# Patient Record
Sex: Female | Born: 1940 | Race: White | Hispanic: No | Smoking: Never smoker
Health system: Southern US, Community
[De-identification: ages and names within clinical notes are randomized; demographics above are authoritative.]

## PROBLEM LIST (undated history)

## (undated) DIAGNOSIS — M1990 Unspecified osteoarthritis, unspecified site: Secondary | ICD-10-CM

## (undated) DIAGNOSIS — F0280 Dementia in other diseases classified elsewhere without behavioral disturbance: Secondary | ICD-10-CM

## (undated) DIAGNOSIS — Z9221 Personal history of antineoplastic chemotherapy: Secondary | ICD-10-CM

## (undated) DIAGNOSIS — A938 Other specified arthropod-borne viral fevers: Secondary | ICD-10-CM

## (undated) DIAGNOSIS — Z973 Presence of spectacles and contact lenses: Secondary | ICD-10-CM

## (undated) DIAGNOSIS — E079 Disorder of thyroid, unspecified: Secondary | ICD-10-CM

## (undated) DIAGNOSIS — R000 Tachycardia, unspecified: Secondary | ICD-10-CM

## (undated) DIAGNOSIS — G0490 Encephalitis and encephalomyelitis, unspecified: Secondary | ICD-10-CM

## (undated) DIAGNOSIS — E039 Hypothyroidism, unspecified: Secondary | ICD-10-CM

## (undated) DIAGNOSIS — Z87442 Personal history of urinary calculi: Secondary | ICD-10-CM

## (undated) DIAGNOSIS — IMO0002 Reserved for concepts with insufficient information to code with codable children: Secondary | ICD-10-CM

## (undated) DIAGNOSIS — I10 Essential (primary) hypertension: Secondary | ICD-10-CM

## (undated) DIAGNOSIS — C50919 Malignant neoplasm of unspecified site of unspecified female breast: Secondary | ICD-10-CM

## (undated) DIAGNOSIS — Z923 Personal history of irradiation: Secondary | ICD-10-CM

## (undated) HISTORY — PX: HERNIA REPAIR: SHX51

## (undated) HISTORY — DX: Essential (primary) hypertension: I10

## (undated) HISTORY — PX: THYROIDECTOMY: SHX17

## (undated) HISTORY — PX: OTHER SURGICAL HISTORY: SHX169

## (undated) HISTORY — PX: ABDOMINAL HYSTERECTOMY: SHX81

---

## 1999-09-22 DIAGNOSIS — Z923 Personal history of irradiation: Secondary | ICD-10-CM

## 1999-09-22 DIAGNOSIS — IMO0001 Reserved for inherently not codable concepts without codable children: Secondary | ICD-10-CM

## 1999-09-22 DIAGNOSIS — Z9221 Personal history of antineoplastic chemotherapy: Secondary | ICD-10-CM

## 1999-09-22 DIAGNOSIS — C50919 Malignant neoplasm of unspecified site of unspecified female breast: Secondary | ICD-10-CM

## 1999-09-22 HISTORY — DX: Personal history of irradiation: Z92.3

## 1999-09-22 HISTORY — DX: Malignant neoplasm of unspecified site of unspecified female breast: C50.919

## 1999-09-22 HISTORY — DX: Personal history of antineoplastic chemotherapy: Z92.21

## 1999-09-22 HISTORY — PX: MASTECTOMY: SHX3

## 1999-09-22 HISTORY — DX: Reserved for inherently not codable concepts without codable children: IMO0001

## 2000-04-21 DIAGNOSIS — C50912 Malignant neoplasm of unspecified site of left female breast: Secondary | ICD-10-CM | POA: Insufficient documentation

## 2000-09-21 HISTORY — PX: BREAST EXCISIONAL BIOPSY: SUR124

## 2004-10-22 ENCOUNTER — Ambulatory Visit: Payer: Self-pay | Admitting: Oncology

## 2004-11-19 ENCOUNTER — Ambulatory Visit: Payer: Self-pay | Admitting: Oncology

## 2005-01-19 ENCOUNTER — Ambulatory Visit: Payer: Self-pay

## 2005-02-03 ENCOUNTER — Ambulatory Visit: Payer: Self-pay | Admitting: Orthopaedic Surgery

## 2005-04-16 ENCOUNTER — Ambulatory Visit: Payer: Self-pay | Admitting: Oncology

## 2005-04-21 ENCOUNTER — Ambulatory Visit: Payer: Self-pay | Admitting: Oncology

## 2005-05-28 ENCOUNTER — Ambulatory Visit: Payer: Self-pay | Admitting: Oncology

## 2005-10-01 ENCOUNTER — Ambulatory Visit: Payer: Self-pay | Admitting: Rheumatology

## 2005-11-03 ENCOUNTER — Ambulatory Visit: Payer: Self-pay | Admitting: Oncology

## 2005-11-19 ENCOUNTER — Ambulatory Visit: Payer: Self-pay | Admitting: Oncology

## 2006-03-12 ENCOUNTER — Ambulatory Visit: Payer: Self-pay | Admitting: Oncology

## 2006-03-21 ENCOUNTER — Ambulatory Visit: Payer: Self-pay | Admitting: Oncology

## 2006-04-21 ENCOUNTER — Ambulatory Visit: Payer: Self-pay | Admitting: Oncology

## 2006-06-02 ENCOUNTER — Ambulatory Visit: Payer: Self-pay | Admitting: Oncology

## 2006-09-21 DIAGNOSIS — G0490 Encephalitis and encephalomyelitis, unspecified: Secondary | ICD-10-CM

## 2006-09-21 HISTORY — DX: Encephalitis and encephalomyelitis, unspecified: G04.90

## 2006-10-01 ENCOUNTER — Ambulatory Visit: Payer: Self-pay | Admitting: Unknown Physician Specialty

## 2007-03-22 ENCOUNTER — Ambulatory Visit: Payer: Self-pay | Admitting: Oncology

## 2007-04-07 ENCOUNTER — Ambulatory Visit: Payer: Self-pay | Admitting: Oncology

## 2007-04-22 ENCOUNTER — Ambulatory Visit: Payer: Self-pay | Admitting: Oncology

## 2007-05-23 ENCOUNTER — Ambulatory Visit: Payer: Self-pay | Admitting: Oncology

## 2007-06-22 ENCOUNTER — Ambulatory Visit: Payer: Self-pay | Admitting: Oncology

## 2007-09-22 ENCOUNTER — Ambulatory Visit: Payer: Self-pay | Admitting: Oncology

## 2007-10-03 ENCOUNTER — Ambulatory Visit: Payer: Self-pay | Admitting: Oncology

## 2007-10-23 ENCOUNTER — Ambulatory Visit: Payer: Self-pay | Admitting: Oncology

## 2008-04-09 ENCOUNTER — Ambulatory Visit: Payer: Self-pay | Admitting: Oncology

## 2008-04-21 ENCOUNTER — Ambulatory Visit: Payer: Self-pay | Admitting: Oncology

## 2008-06-12 ENCOUNTER — Ambulatory Visit: Payer: Self-pay | Admitting: Oncology

## 2008-11-14 ENCOUNTER — Ambulatory Visit: Payer: Self-pay | Admitting: Family Medicine

## 2008-12-20 ENCOUNTER — Ambulatory Visit: Payer: Self-pay | Admitting: Oncology

## 2008-12-28 ENCOUNTER — Ambulatory Visit: Payer: Self-pay | Admitting: Oncology

## 2009-01-19 ENCOUNTER — Ambulatory Visit: Payer: Self-pay | Admitting: Oncology

## 2009-10-09 ENCOUNTER — Ambulatory Visit: Payer: Self-pay | Admitting: Unknown Physician Specialty

## 2009-12-11 ENCOUNTER — Ambulatory Visit: Payer: Self-pay | Admitting: Oncology

## 2009-12-20 ENCOUNTER — Ambulatory Visit: Payer: Self-pay | Admitting: Oncology

## 2010-01-23 ENCOUNTER — Ambulatory Visit: Payer: Self-pay | Admitting: Oncology

## 2010-04-01 ENCOUNTER — Ambulatory Visit: Payer: Self-pay | Admitting: Internal Medicine

## 2010-06-05 ENCOUNTER — Ambulatory Visit: Payer: Self-pay | Admitting: Otolaryngology

## 2010-06-10 ENCOUNTER — Ambulatory Visit: Payer: Self-pay | Admitting: Otolaryngology

## 2010-07-09 ENCOUNTER — Ambulatory Visit: Payer: Self-pay | Admitting: Otolaryngology

## 2010-07-21 ENCOUNTER — Ambulatory Visit: Payer: Self-pay | Admitting: Otolaryngology

## 2010-12-12 ENCOUNTER — Ambulatory Visit: Payer: Self-pay | Admitting: Oncology

## 2010-12-19 ENCOUNTER — Ambulatory Visit: Payer: Self-pay | Admitting: Family Medicine

## 2010-12-21 ENCOUNTER — Ambulatory Visit: Payer: Self-pay | Admitting: Oncology

## 2011-01-29 ENCOUNTER — Ambulatory Visit: Payer: Self-pay | Admitting: Oncology

## 2011-08-11 ENCOUNTER — Ambulatory Visit: Payer: Self-pay | Admitting: Family Medicine

## 2011-10-21 ENCOUNTER — Ambulatory Visit: Payer: Self-pay | Admitting: General Practice

## 2011-10-26 DIAGNOSIS — M75120 Complete rotator cuff tear or rupture of unspecified shoulder, not specified as traumatic: Secondary | ICD-10-CM | POA: Insufficient documentation

## 2011-12-14 ENCOUNTER — Ambulatory Visit: Payer: Self-pay | Admitting: Oncology

## 2011-12-14 LAB — CBC CANCER CENTER
Basophil #: 0 x10 3/mm (ref 0.0–0.1)
Eosinophil #: 0.1 x10 3/mm (ref 0.0–0.7)
Eosinophil %: 1.9 %
Lymphocyte #: 1.5 x10 3/mm (ref 1.0–3.6)
Lymphocyte %: 28.5 %
MCH: 30.2 pg (ref 26.0–34.0)
MCHC: 34.8 g/dL (ref 32.0–36.0)
MCV: 87 fL (ref 80–100)
Monocyte #: 0.4 x10 3/mm (ref 0.0–0.7)
Monocyte %: 6.9 %
Neutrophil #: 3.3 x10 3/mm (ref 1.4–6.5)
Neutrophil %: 62 %
Platelet: 192 x10 3/mm (ref 150–440)
RBC: 4.78 10*6/uL (ref 3.80–5.20)
RDW: 14.6 % — ABNORMAL HIGH (ref 11.5–14.5)
WBC: 5.4 x10 3/mm (ref 3.6–11.0)

## 2011-12-14 LAB — COMPREHENSIVE METABOLIC PANEL
Albumin: 3.7 g/dL (ref 3.4–5.0)
Alkaline Phosphatase: 78 U/L (ref 50–136)
Anion Gap: 9 (ref 7–16)
Bilirubin,Total: 0.4 mg/dL (ref 0.2–1.0)
Calcium, Total: 9.2 mg/dL (ref 8.5–10.1)
Chloride: 104 mmol/L (ref 98–107)
Co2: 30 mmol/L (ref 21–32)
Creatinine: 0.94 mg/dL (ref 0.60–1.30)
EGFR (African American): >60
EGFR (Non-African Amer.): >60
Glucose: 110 mg/dL — ABNORMAL HIGH (ref 65–99)
Potassium: 4 mmol/L (ref 3.5–5.1)
SGOT(AST): 16 U/L (ref 15–37)
Total Protein: 7 g/dL (ref 6.4–8.2)

## 2011-12-21 ENCOUNTER — Ambulatory Visit: Payer: Self-pay | Admitting: Oncology

## 2012-01-20 ENCOUNTER — Ambulatory Visit: Payer: Self-pay | Admitting: Oncology

## 2012-02-22 ENCOUNTER — Ambulatory Visit: Payer: Self-pay | Admitting: Oncology

## 2012-06-27 ENCOUNTER — Ambulatory Visit: Payer: Self-pay | Admitting: Oncology

## 2012-06-27 LAB — CBC CANCER CENTER
Basophil #: 0.1 x10 3/mm (ref 0.0–0.1)
Eosinophil #: 0.2 x10 3/mm (ref 0.0–0.7)
HCT: 43.3 % (ref 35.0–47.0)
Lymphocyte #: 1.4 x10 3/mm (ref 1.0–3.6)
Lymphocyte %: 24.2 %
MCHC: 32.9 g/dL (ref 32.0–36.0)
Monocyte #: 0.4 x10 3/mm (ref 0.2–0.9)
Neutrophil #: 3.8 x10 3/mm (ref 1.4–6.5)
Neutrophil %: 64.8 %
RDW: 14.1 % (ref 11.5–14.5)

## 2012-06-27 LAB — COMPREHENSIVE METABOLIC PANEL
BUN: 9 mg/dL (ref 7–18)
Bilirubin,Total: 0.7 mg/dL (ref 0.2–1.0)
Chloride: 105 mmol/L (ref 98–107)
Creatinine: 0.9 mg/dL (ref 0.60–1.30)
EGFR (African American): >60
Osmolality: 280 (ref 275–301)
Potassium: 4 mmol/L (ref 3.5–5.1)
SGPT (ALT): 19 U/L (ref 12–78)
Sodium: 141 mmol/L (ref 136–145)
Total Protein: 7.4 g/dL (ref 6.4–8.2)

## 2012-07-22 ENCOUNTER — Ambulatory Visit: Payer: Self-pay | Admitting: Oncology

## 2012-07-27 DIAGNOSIS — R413 Other amnesia: Secondary | ICD-10-CM | POA: Insufficient documentation

## 2012-08-05 ENCOUNTER — Emergency Department: Payer: Self-pay | Admitting: Emergency Medicine

## 2012-10-29 ENCOUNTER — Emergency Department: Payer: Self-pay | Admitting: Emergency Medicine

## 2012-10-30 LAB — COMPREHENSIVE METABOLIC PANEL
Alkaline Phosphatase: 71 U/L (ref 50–136)
Anion Gap: 9 (ref 7–16)
BUN: 11 mg/dL (ref 7–18)
Calcium, Total: 9.5 mg/dL (ref 8.5–10.1)
Chloride: 103 mmol/L (ref 98–107)
EGFR (Non-African Amer.): >60
Glucose: 104 mg/dL — ABNORMAL HIGH (ref 65–99)
Potassium: 3.8 mmol/L (ref 3.5–5.1)
SGOT(AST): 22 U/L (ref 15–37)
Total Protein: 7.6 g/dL (ref 6.4–8.2)

## 2012-10-30 LAB — CBC WITH DIFFERENTIAL/PLATELET
Basophil #: 0.1 10*3/uL (ref 0.0–0.1)
Basophil %: 0.9 %
Eosinophil %: 1.8 %
HGB: 15.4 g/dL (ref 12.0–16.0)
Lymphocyte #: 1.9 10*3/uL (ref 1.0–3.6)
Lymphocyte %: 25.6 %
MCH: 29.5 pg (ref 26.0–34.0)
MCHC: 34.5 g/dL (ref 32.0–36.0)
MCV: 85 fL (ref 80–100)
Monocyte %: 8 %
Neutrophil #: 4.7 10*3/uL (ref 1.4–6.5)
RBC: 5.23 10*6/uL — ABNORMAL HIGH (ref 3.80–5.20)
RDW: 14 % (ref 11.5–14.5)
WBC: 7.3 10*3/uL (ref 3.6–11.0)

## 2012-10-30 LAB — TROPONIN I: Troponin-I: <0.02 ng/mL

## 2012-10-30 LAB — URINALYSIS, COMPLETE
Glucose,UR: NEGATIVE mg/dL (ref 0–75)
Nitrite: NEGATIVE
Ph: 8 (ref 4.5–8.0)
Protein: NEGATIVE
Specific Gravity: 1.005 (ref 1.003–1.030)
Squamous Epithelial: <1
Transitional Epi: <1

## 2012-10-30 LAB — CK TOTAL AND CKMB (NOT AT ARMC)
CK, Total: 67 U/L (ref 21–215)
CK-MB: 1 ng/mL (ref 0.5–3.6)

## 2012-12-23 ENCOUNTER — Ambulatory Visit: Payer: Self-pay | Admitting: Oncology

## 2013-05-11 ENCOUNTER — Ambulatory Visit: Payer: Self-pay | Admitting: Oncology

## 2013-08-22 ENCOUNTER — Ambulatory Visit: Payer: Self-pay | Admitting: Unknown Physician Specialty

## 2013-08-25 ENCOUNTER — Ambulatory Visit: Payer: Self-pay | Admitting: Unknown Physician Specialty

## 2013-08-31 ENCOUNTER — Ambulatory Visit: Payer: Self-pay | Admitting: Gynecologic Oncology

## 2013-09-05 ENCOUNTER — Ambulatory Visit: Payer: Self-pay | Admitting: Gynecologic Oncology

## 2013-09-06 LAB — CANCER ANTIGEN 27.29: CA 27.29: 12.6 U/mL (ref 0.0–38.6)

## 2013-09-06 LAB — CEA: CEA: 1.4 ng/mL (ref 0.0–4.7)

## 2013-09-21 ENCOUNTER — Ambulatory Visit: Payer: Self-pay | Admitting: Gynecologic Oncology

## 2013-09-25 ENCOUNTER — Ambulatory Visit: Payer: Self-pay | Admitting: Unknown Physician Specialty

## 2013-09-26 LAB — PATHOLOGY REPORT

## 2013-10-22 ENCOUNTER — Ambulatory Visit: Payer: Self-pay | Admitting: Gynecologic Oncology

## 2013-11-15 DIAGNOSIS — R1900 Intra-abdominal and pelvic swelling, mass and lump, unspecified site: Secondary | ICD-10-CM | POA: Insufficient documentation

## 2013-11-20 ENCOUNTER — Ambulatory Visit: Payer: Self-pay | Admitting: Family Medicine

## 2013-11-24 DIAGNOSIS — R002 Palpitations: Secondary | ICD-10-CM | POA: Insufficient documentation

## 2013-11-24 DIAGNOSIS — E039 Hypothyroidism, unspecified: Secondary | ICD-10-CM | POA: Insufficient documentation

## 2014-03-08 ENCOUNTER — Emergency Department: Payer: Self-pay | Admitting: Emergency Medicine

## 2014-04-06 DIAGNOSIS — M1710 Unilateral primary osteoarthritis, unspecified knee: Secondary | ICD-10-CM

## 2014-04-06 DIAGNOSIS — M1712 Unilateral primary osteoarthritis, left knee: Secondary | ICD-10-CM | POA: Insufficient documentation

## 2014-05-21 ENCOUNTER — Ambulatory Visit: Payer: Self-pay | Admitting: Podiatry

## 2014-07-12 ENCOUNTER — Ambulatory Visit: Payer: Self-pay | Admitting: Oncology

## 2014-08-08 ENCOUNTER — Ambulatory Visit: Payer: Self-pay | Admitting: Oncology

## 2014-08-21 ENCOUNTER — Ambulatory Visit: Payer: Self-pay | Admitting: Oncology

## 2014-08-21 DIAGNOSIS — I10 Essential (primary) hypertension: Secondary | ICD-10-CM

## 2014-08-21 HISTORY — DX: Essential (primary) hypertension: I10

## 2014-09-03 ENCOUNTER — Other Ambulatory Visit: Payer: Self-pay | Admitting: Oncology

## 2014-09-03 DIAGNOSIS — R922 Inconclusive mammogram: Secondary | ICD-10-CM

## 2014-09-03 DIAGNOSIS — Z853 Personal history of malignant neoplasm of breast: Secondary | ICD-10-CM

## 2014-09-03 DIAGNOSIS — N644 Mastodynia: Secondary | ICD-10-CM

## 2014-09-19 ENCOUNTER — Ambulatory Visit
Admission: RE | Admit: 2014-09-19 | Discharge: 2014-09-19 | Disposition: A | Discharge status: Home / Self Care | Payer: Medicare Other | Source: Ambulatory Visit | Attending: Oncology | Admitting: Oncology

## 2014-09-19 DIAGNOSIS — R922 Inconclusive mammogram: Secondary | ICD-10-CM

## 2014-09-19 DIAGNOSIS — Z853 Personal history of malignant neoplasm of breast: Secondary | ICD-10-CM

## 2014-09-19 DIAGNOSIS — N644 Mastodynia: Secondary | ICD-10-CM

## 2014-09-19 MED ORDER — GADOBENATE DIMEGLUMINE 529 MG/ML IV SOLN: 15.0000 mL | Freq: Once | INTRAVENOUS | Status: AC | PRN

## 2014-10-23 DIAGNOSIS — R413 Other amnesia: Secondary | ICD-10-CM | POA: Insufficient documentation

## 2014-10-23 DIAGNOSIS — F09 Unspecified mental disorder due to known physiological condition: Secondary | ICD-10-CM | POA: Insufficient documentation

## 2014-10-23 DIAGNOSIS — F419 Anxiety disorder, unspecified: Secondary | ICD-10-CM | POA: Insufficient documentation

## 2015-01-20 DIAGNOSIS — Z86711 Personal history of pulmonary embolism: Secondary | ICD-10-CM | POA: Insufficient documentation

## 2015-02-28 DIAGNOSIS — R079 Chest pain, unspecified: Secondary | ICD-10-CM | POA: Insufficient documentation

## 2015-03-04 ENCOUNTER — Emergency Department: Payer: PPO

## 2015-03-04 ENCOUNTER — Inpatient Hospital Stay
Admission: EM | Admit: 2015-03-04 | Discharge: 2015-03-07 | DRG: 871 | Disposition: A | Discharge status: Home / Self Care | Payer: PPO | Attending: Internal Medicine | Admitting: Internal Medicine

## 2015-03-04 ENCOUNTER — Encounter: Payer: Self-pay | Admitting: Emergency Medicine

## 2015-03-04 DIAGNOSIS — Z79899 Other long term (current) drug therapy: Secondary | ICD-10-CM | POA: Diagnosis not present

## 2015-03-04 DIAGNOSIS — R509 Fever, unspecified: Secondary | ICD-10-CM | POA: Insufficient documentation

## 2015-03-04 DIAGNOSIS — A419 Sepsis, unspecified organism: Principal | ICD-10-CM | POA: Diagnosis present

## 2015-03-04 DIAGNOSIS — Z888 Allergy status to other drugs, medicaments and biological substances status: Secondary | ICD-10-CM

## 2015-03-04 DIAGNOSIS — M62838 Other muscle spasm: Secondary | ICD-10-CM | POA: Diagnosis present

## 2015-03-04 DIAGNOSIS — R109 Unspecified abdominal pain: Secondary | ICD-10-CM

## 2015-03-04 DIAGNOSIS — I10 Essential (primary) hypertension: Secondary | ICD-10-CM | POA: Diagnosis present

## 2015-03-04 DIAGNOSIS — T675XXA Heat exhaustion, unspecified, initial encounter: Secondary | ICD-10-CM | POA: Diagnosis present

## 2015-03-04 DIAGNOSIS — Z90710 Acquired absence of both cervix and uterus: Secondary | ICD-10-CM | POA: Diagnosis not present

## 2015-03-04 DIAGNOSIS — R410 Disorientation, unspecified: Secondary | ICD-10-CM

## 2015-03-04 DIAGNOSIS — Z853 Personal history of malignant neoplasm of breast: Secondary | ICD-10-CM | POA: Diagnosis not present

## 2015-03-04 DIAGNOSIS — K5900 Constipation, unspecified: Secondary | ICD-10-CM | POA: Diagnosis present

## 2015-03-04 DIAGNOSIS — I2699 Other pulmonary embolism without acute cor pulmonale: Secondary | ICD-10-CM | POA: Diagnosis present

## 2015-03-04 DIAGNOSIS — Z885 Allergy status to narcotic agent status: Secondary | ICD-10-CM | POA: Diagnosis not present

## 2015-03-04 DIAGNOSIS — Z91040 Latex allergy status: Secondary | ICD-10-CM | POA: Diagnosis not present

## 2015-03-04 DIAGNOSIS — R600 Localized edema: Secondary | ICD-10-CM | POA: Diagnosis present

## 2015-03-04 DIAGNOSIS — R079 Chest pain, unspecified: Secondary | ICD-10-CM

## 2015-03-04 DIAGNOSIS — E860 Dehydration: Secondary | ICD-10-CM | POA: Diagnosis present

## 2015-03-04 DIAGNOSIS — Z9012 Acquired absence of left breast and nipple: Secondary | ICD-10-CM | POA: Diagnosis present

## 2015-03-04 DIAGNOSIS — I959 Hypotension, unspecified: Secondary | ICD-10-CM

## 2015-03-04 DIAGNOSIS — G43909 Migraine, unspecified, not intractable, without status migrainosus: Secondary | ICD-10-CM | POA: Diagnosis present

## 2015-03-04 DIAGNOSIS — R609 Edema, unspecified: Secondary | ICD-10-CM

## 2015-03-04 DIAGNOSIS — E039 Hypothyroidism, unspecified: Secondary | ICD-10-CM | POA: Diagnosis present

## 2015-03-04 HISTORY — DX: Other specified arthropod-borne viral fevers: A93.8

## 2015-03-04 HISTORY — DX: Disorder of thyroid, unspecified: E07.9

## 2015-03-04 HISTORY — DX: Tachycardia, unspecified: R00.0

## 2015-03-04 HISTORY — DX: Malignant neoplasm of unspecified site of unspecified female breast: C50.919

## 2015-03-04 HISTORY — DX: Encephalitis and encephalomyelitis, unspecified: G04.90

## 2015-03-04 LAB — COMPREHENSIVE METABOLIC PANEL
ALT: 14 U/L (ref 14–54)
AST: 20 U/L (ref 15–41)
Albumin: 3.8 g/dL (ref 3.5–5.0)
Alkaline Phosphatase: 65 U/L (ref 38–126)
Anion gap: 8 (ref 5–15)
BUN: 10 mg/dL (ref 6–20)
CALCIUM: 8.6 mg/dL — AB (ref 8.9–10.3)
CO2: 26 mmol/L (ref 22–32)
CREATININE: 0.65 mg/dL (ref 0.44–1.00)
Chloride: 100 mmol/L — ABNORMAL LOW (ref 101–111)
GFR calc Af Amer: >60 mL/min (ref >60)
Glucose, Bld: 165 mg/dL — ABNORMAL HIGH (ref 65–99)
Potassium: 3.4 mmol/L — ABNORMAL LOW (ref 3.5–5.1)
Sodium: 134 mmol/L — ABNORMAL LOW (ref 135–145)
TOTAL PROTEIN: 6.7 g/dL (ref 6.5–8.1)
Total Bilirubin: 1.1 mg/dL (ref 0.3–1.2)

## 2015-03-04 LAB — CKMB (ARMC ONLY): CK, MB: 1.3 ng/mL (ref 0.5–5.0)

## 2015-03-04 LAB — CBC WITH DIFFERENTIAL/PLATELET
BASOS PCT: 1 %
Basophils Absolute: 0.1 10*3/uL (ref 0–0.1)
EOS ABS: 0.1 10*3/uL (ref 0–0.7)
Eosinophils Relative: 1 %
HCT: 37.6 % (ref 35.0–47.0)
HEMOGLOBIN: 12.8 g/dL (ref 12.0–16.0)
Lymphocytes Relative: 9 %
Lymphs Abs: 1.1 10*3/uL (ref 1.0–3.6)
MCH: 28.9 pg (ref 26.0–34.0)
MCHC: 34 g/dL (ref 32.0–36.0)
MCV: 85.2 fL (ref 80.0–100.0)
MONO ABS: 1.1 10*3/uL — AB (ref 0.2–0.9)
MONOS PCT: 9 %
Neutro Abs: 9.9 10*3/uL — ABNORMAL HIGH (ref 1.4–6.5)
Neutrophils Relative %: 80 %
Platelets: 164 10*3/uL (ref 150–440)
RBC: 4.41 MIL/uL (ref 3.80–5.20)
RDW: 13.9 % (ref 11.5–14.5)
WBC: 12.3 10*3/uL — ABNORMAL HIGH (ref 3.6–11.0)

## 2015-03-04 LAB — URINALYSIS COMPLETE WITH MICROSCOPIC (ARMC ONLY)
BACTERIA UA: NONE SEEN
Bilirubin Urine: NEGATIVE
GLUCOSE, UA: NEGATIVE mg/dL
KETONES UR: NEGATIVE mg/dL
Leukocytes, UA: NEGATIVE
Nitrite: NEGATIVE
PROTEIN: NEGATIVE mg/dL
Specific Gravity, Urine: 1.011 (ref 1.005–1.030)
pH: 6 (ref 5.0–8.0)

## 2015-03-04 LAB — CK: CK TOTAL: 43 U/L (ref 38–234)

## 2015-03-04 LAB — LACTIC ACID, PLASMA: LACTIC ACID, VENOUS: 0.9 mmol/L (ref 0.5–2.0)

## 2015-03-04 MED ORDER — SODIUM CHLORIDE 0.9 % IV BOLUS (SEPSIS)
1000.0000 mL | Freq: Once | INTRAVENOUS | Status: AC
  Administered 2015-03-04: 1000 mL via INTRAVENOUS

## 2015-03-04 MED ORDER — SENNOSIDES-DOCUSATE SODIUM 8.6-50 MG PO TABS
1.0000 | ORAL_TABLET | Freq: Every evening | ORAL | Status: DC | PRN
  Administered 2015-03-05: 18:00:00 1 via ORAL
  Filled 2015-03-04: qty 1

## 2015-03-04 MED ORDER — HEPARIN SODIUM (PORCINE) 5000 UNIT/ML IJ SOLN
5000.0000 [IU] | Freq: Three times a day (TID) | INTRAMUSCULAR | Status: DC
  Administered 2015-03-04 – 2015-03-05 (×3): 5000 [IU] via SUBCUTANEOUS
  Filled 2015-03-04 (×3): qty 1

## 2015-03-04 MED ORDER — ACETAMINOPHEN 325 MG PO TABS
650.0000 mg | ORAL_TABLET | Freq: Four times a day (QID) | ORAL | Status: DC | PRN
  Administered 2015-03-04 – 2015-03-06 (×6): 650 mg via ORAL
  Filled 2015-03-04 (×6): qty 2

## 2015-03-04 MED ORDER — ONDANSETRON HCL 4 MG PO TABS
4.0000 mg | ORAL_TABLET | Freq: Four times a day (QID) | ORAL | Status: DC | PRN
  Administered 2015-03-06: 08:00:00 4 mg via ORAL
  Filled 2015-03-04: qty 1

## 2015-03-04 MED ORDER — LEVOTHYROXINE SODIUM 50 MCG PO TABS
50.0000 ug | ORAL_TABLET | ORAL | Status: DC
  Administered 2015-03-04 – 2015-03-07 (×4): 50 ug via ORAL
  Filled 2015-03-04 (×4): qty 1

## 2015-03-04 MED ORDER — ACETAMINOPHEN 325 MG PO TABS
650.0000 mg | ORAL_TABLET | Freq: Once | ORAL | Status: AC
  Administered 2015-03-04: 650 mg via ORAL

## 2015-03-04 MED ORDER — IBUPROFEN 400 MG PO TABS
800.0000 mg | ORAL_TABLET | Freq: Once | ORAL | Status: DC
  Filled 2015-03-04: qty 2

## 2015-03-04 MED ORDER — ACETAMINOPHEN 650 MG RE SUPP: 650.0000 mg | Freq: Four times a day (QID) | RECTAL | Status: DC | PRN

## 2015-03-04 MED ORDER — ONDANSETRON HCL 4 MG/2ML IJ SOLN
4.0000 mg | Freq: Four times a day (QID) | INTRAMUSCULAR | Status: DC | PRN
  Administered 2015-03-04 – 2015-03-06 (×2): 4 mg via INTRAVENOUS
  Filled 2015-03-04 (×2): qty 2

## 2015-03-04 MED ORDER — ACETAMINOPHEN 325 MG PO TABS
ORAL_TABLET | ORAL | Status: AC
  Filled 2015-03-04: qty 2

## 2015-03-04 MED ORDER — SODIUM CHLORIDE 0.9 % IV SOLN
INTRAVENOUS | Status: DC
  Administered 2015-03-04 – 2015-03-07 (×6): via INTRAVENOUS

## 2015-03-04 MED ORDER — POTASSIUM CHLORIDE 20 MEQ/15ML (10%) PO SOLN
40.0000 meq | Freq: Once | ORAL | Status: AC
  Administered 2015-03-04: 15:00:00 40 meq via ORAL
  Filled 2015-03-04: qty 30

## 2015-03-04 MED ORDER — PIPERACILLIN-TAZOBACTAM 3.375 G IVPB 30 MIN
3.3750 g | Freq: Four times a day (QID) | INTRAVENOUS | Status: DC
  Administered 2015-03-04: 3.375 g via INTRAVENOUS
  Filled 2015-03-04 (×5): qty 50

## 2015-03-04 MED ORDER — LORAZEPAM 1 MG PO TABS
1.0000 mg | ORAL_TABLET | ORAL | Status: AC
  Administered 2015-03-04: 14:00:00 1 mg via ORAL
  Filled 2015-03-04: qty 1

## 2015-03-04 MED ORDER — PIPERACILLIN-TAZOBACTAM 3.375 G IVPB
3.3750 g | Freq: Three times a day (TID) | INTRAVENOUS | Status: DC
  Administered 2015-03-04 – 2015-03-07 (×8): 3.375 g via INTRAVENOUS
  Filled 2015-03-04 (×12): qty 50

## 2015-03-04 MED ORDER — ALUM & MAG HYDROXIDE-SIMETH 200-200-20 MG/5ML PO SUSP: 30.0000 mL | Freq: Four times a day (QID) | ORAL | Status: DC | PRN

## 2015-03-04 MED ORDER — METOPROLOL SUCCINATE ER 25 MG PO TB24
25.0000 mg | ORAL_TABLET | Freq: Every day | ORAL | Status: DC
  Administered 2015-03-04: 14:00:00 25 mg via ORAL
  Filled 2015-03-04 (×2): qty 1

## 2015-03-04 NOTE — ED Notes (Signed)
Pt with blood pressure 84/53. Pt with 3+ radial pulses, skin warm, slightly moist, pink. Hr 72. Dr. Dahlia Client notified of pt's blood pressure, order for ns bolus received.

## 2015-03-04 NOTE — ED Provider Notes (Signed)
[facility] Emergency Department Provider Note  ____________________________________________  Time seen: Approximately 4:11 AM  I have reviewed the triage vital signs and the nursing notes.   HISTORY  Chief Complaint Heat Exposure    HPI Debra Patel is a 74 y.o. female who comes in today with a fever. The patient reports to her daughter kept checking her temperature and it was going higher and higher. The patient's daughter gave her dose of Aleve but it did not help bring down her temperature. The patient reports that she does not like to take medicines and most medicines do not help her. She also reports that she felt as though she had to take deeper breaths than normal earlier today. She reports that she did have some nausea earlier as well as some back pain which is improved. The patient seems very somnolent and mildly confused during the exam. Per the triage note the patient was out in the heat mowing grass today and then started feeling bad. They report that her temperature was up to 103.2. She was brought in for evaluation of heat exhaustion.   Past Medical History  Diagnosis Date  . Thyroid disease   . Tick fever   . Brain infection   . Tachycardia     There are no active problems to display for this patient.   Past Surgical History  Procedure Laterality Date  . Abdominal hysterectomy    . Mastectomy      Current Outpatient Rx  Name  Route  Sig  Dispense  Refill  . levothyroxine (SYNTHROID, LEVOTHROID) 50 MCG tablet   Oral   Take 50 mcg by mouth every morning. (brand name only)           Dispense as written.     Allergies Motrin; Oxycodone; and Latex  No family history on file.  Social History History  Substance Use Topics  . Smoking status: Never Smoker   . Smokeless tobacco: Not on file  . Alcohol Use: No    Review of Systems Constitutional: Fever Eyes: No visual changes. ENT: No sore throat. Cardiovascular: Denies  chest pain. Respiratory: Shortness of breath. Gastrointestinal: Nausea with no vomiting or diarrhea or abdominal pain Genitourinary: Negative for dysuria. Musculoskeletal: back pain. Skin: Negative for rash. Neurological: headaches,  10-point ROS otherwise negative.  ____________________________________________   PHYSICAL EXAM:  VITAL SIGNS: ED Triage Vitals  Enc Vitals Group     BP 03/04/15 0031 102/52 mmHg     Pulse Rate 03/04/15 0031 113     Resp 03/04/15 0031 20     Temp 03/04/15 0031 101.4 F (38.6 C)     Temp Source 03/04/15 0031 Oral     SpO2 03/04/15 0031 95 %     Weight 03/04/15 0031 163 lb (73.936 kg)     Height 03/04/15 0031 5\' 4"  (1.626 m)     Head Cir --      Peak Flow --      Pain Score 03/04/15 0040 0     Pain Loc --      Pain Edu? --      Excl. in [place]? --     Constitutional: Alert and oriented. Well appearing and in moderate distress. Eyes: Conjunctivae are normal. PERRL. EOMI. Head: Atraumatic. Nose: No congestion/rhinnorhea. Mouth/Throat: Mucous membranes are moist.  Oropharynx non-erythematous. Cardiovascular: Normal rate, regular rhythm. Grossly normal heart sounds.  Good peripheral circulation. Respiratory: Normal respiratory effort.  No retractions. Lungs CTAB. Gastrointestinal: Soft and nontender. No  distention. Positive bowel sounds Genitourinary: Deferred Musculoskeletal: No lower extremity tenderness nor edema.  No joint effusions. Neurologic:  Normal speech and language. No gross focal neurologic deficits are appreciated.  Skin:  Skin is warm, dry and intact. No rash noted. Psychiatric: Mood and affect are normal  ____________________________________________   LABS (all labs ordered are listed, but only abnormal results are displayed)  Labs Reviewed  COMPREHENSIVE METABOLIC PANEL - Abnormal; Notable for the following:    Sodium 134 (*)    Potassium 3.4 (*)    Chloride 100 (*)    Glucose, Bld 165 (*)    Calcium 8.6 (*)    All other  components within normal limits  CBC WITH DIFFERENTIAL/PLATELET - Abnormal; Notable for the following:    WBC 12.3 (*)    Neutro Abs 9.9 (*)    Monocytes Absolute 1.1 (*)    All other components within normal limits  URINALYSIS COMPLETEWITH MICROSCOPIC ([facility] ONLY) - Abnormal; Notable for the following:    Color, Urine YELLOW (*)    APPearance CLEAR (*)    Hgb urine dipstick 1+ (*)    Squamous Epithelial / LPF 0-5 (*)    All other components within normal limits  CULTURE, BLOOD (ROUTINE X 2)  CULTURE, BLOOD (ROUTINE X 2)  CK  CKMB([facility] ONLY)  LACTIC ACID, PLASMA  LACTIC ACID, PLASMA   ____________________________________________  EKG  ED ECG REPORT I, Loney Hering, the attending physician, personally viewed and interpreted this ECG.   Date: 03/04/2015  EKG Time: 703  Rate: 72  Rhythm: normal sinus rhythm  Axis: normal   Intervals:none  ST&T Change: none  ____________________________________________  RADIOLOGY  CT Head: No acute intracranial abnormalities, Mild, chronic atrophy and small vessel ischemic changes CXR: No active cardiopulmonary disease ____________________________________________   PROCEDURES  Procedure(s) performed: None  Critical Care performed: No  ____________________________________________   INITIAL IMPRESSION / ASSESSMENT AND PLAN / ED COURSE  Pertinent labs & imaging results that were available during my care of the patient were reviewed by me and considered in my medical decision making (see chart for details).  The patient is a 75 year old female who comes in with a fever of unknown origin and hypotension. The patient reports that she feels better but after 1 liter of normal saline her blood pressure decreased to 77 systolic. The patient will be admitted to the hospital with possible heat stroke vs sepsis. The patient has no sign of infection in her urine or chest xray. The patient has no other concerns at this  point. ____________________________________________   FINAL CLINICAL IMPRESSION(S) / ED DIAGNOSES  Final diagnoses:  Fever, unspecified fever cause  Hypotension, unspecified hypotension type  Confusion      Loney Hering, MD 03/04/15 [phone]

## 2015-03-04 NOTE — ED Notes (Signed)
Pt states yesterday she mowed out in the heat for an hour, when she stopped she started to feel bad, disoriented, ran a fever, had chills, pt states this am she cont to feel bad, with vomiting and nausea, chest tightness, pt states an hour ago she was very nauseated. Pt states her temp has been up to 103.2 pt denies cough or congestion, denies urinary s/s, states that she has been tired feeling for the past few days

## 2015-03-04 NOTE — Plan of Care (Signed)
Problem: Discharge Progression Outcomes Goal: Discharge plan in place and appropriate individualization  Outcome: Progressing Address patient as Debra Patel. Patient lives at home alone. Patient has history of thyroid disease, tachycardia, brain infection, tick fever controlled with home medications. Goal: Other Discharge Outcomes/Goals Outcome: Progressing Plan of Care Progressing to Goal: No complaints of pain. VSS. Temperature elevated tylenol given and cooling blanket applied. IV fluids infusing. Tolerated diet well.

## 2015-03-04 NOTE — ED Notes (Signed)
Report to anna, rn

## 2015-03-04 NOTE — Progress Notes (Signed)
Dr. Benjie Karvonen notified that the pt was very aggitated and HR was elevated with fever, orders received for ativan x 1 dose and restart metoprolol Makinzi Prieur I, RN 03/04/2015 2:04 PM

## 2015-03-04 NOTE — ED Notes (Signed)
Patient transported to CT 

## 2015-03-04 NOTE — H&P (Signed)
[place] [facility] [place] NAME: Debra Patel    MR#:  [id number]  DATE OF BIRTH:  03-Feb-1941  DATE OF ADMISSION:  03/04/2015  PRIMARY CARE PHYSICIAN: No PCP Per Patient   REQUESTING/REFERRING PHYSICIAN: Dr. Dahlia Client  CHIEF COMPLAINT:  Fever  HISTORY OF PRESENT ILLNESS:  Debra Patel  is a 74 y.o. female with a known history of hypothyroidosm who presented to the emergency department for fever. Patient's daughter reports that she gave her Aleve for the temperature but it did not improve. Patient was apparently outside mowing her grass earlier in the day. They reported her temperature was up to 103.2. She also reports she was not feeling well for past 4 days. Nothing in particular she says, just not feeling well. She does state at some point she had RLQ abdominal pain, but this has gone away. In the emergency department she was found to have hypotension. She was given 1 L of normal saline. PAST MEDICAL HISTORY:   Past Medical History  Diagnosis Date  . Thyroid disease   . Tick fever   . Brain infection   . Tachycardia    Breast cancer PAST SURGICAL HISTORY:   Past Surgical History  Procedure Laterality Date  . Abdominal hysterectomy    . Mastectomy      SOCIAL HISTORY:   History  Substance Use Topics  . Smoking status: Never Smoker   . Smokeless tobacco: Not on file  . Alcohol Use: No    FAMILY HISTORY:  No history of CAD or hypertension.  DRUG ALLERGIES:   Allergies  Allergen Reactions  . Motrin [Ibuprofen] Other (See Comments)    depression  . Oxycodone Nausea Only  . Latex Rash     REVIEW OF SYSTEMS:  CONSTITUTIONAL: Positive for fever, fatigue and weakness.  EYES: No blurred or double vision.  EARS, NOSE, AND THROAT: No tinnitus or ear pain.  RESPIRATORY: No cough, shortness of breath, wheezing or hemoptysis.  CARDIOVASCULAR: No chest pain, orthopnea, edema.  GASTROINTESTINAL: No nausea, vomiting, diarrhea  or abdominal pain.  GENITOURINARY: No dysuria, hematuria.  ENDOCRINE: No polyuria, nocturia,  HEMATOLOGY: No anemia, easy bruising or bleeding SKIN: No rash or lesion. MUSCULOSKELETAL: No joint pain or arthritis.   NEUROLOGIC: No tingling, numbness, weakness.  PSYCHIATRY: No anxiety or depression.   MEDICATIONS AT HOME:   Prior to Admission medications   Medication Sig Start Date End Date Taking? Authorizing Provider  hydrochlorothiazide (HYDRODIURIL) 25 MG tablet Take 25 mg by mouth daily as needed (swelling).   Yes Historical Provider, MD  levothyroxine (SYNTHROID, LEVOTHROID) 50 MCG tablet Take 50 mcg by mouth every morning. (brand name only)   Yes Historical Provider, MD  metoprolol succinate (TOPROL-XL) 25 MG 24 hr tablet Take 25 mg by mouth daily.   Yes Historical Provider, MD      VITAL SIGNS:  Blood pressure 114/54, pulse 72, temperature 97.7 F (36.5 C), temperature source Oral, resp. rate 20, height 5\' 4"  (1.626 m), weight 76.856 kg (169 lb 7 oz), SpO2 99 %.  PHYSICAL EXAMINATION:  GENERAL:  74 y.o.-year-old patient lying in the bed with no acute distress.  EYES: Pupils equal, round, reactive to light and accommodation. No scleral icterus. Extraocular muscles intact.  HEENT: Head atraumatic, normocephalic. Oropharynx and nasopharynx clear.  NECK:  Supple, no jugular venous distention. No thyroid enlargement, no tenderness.  LUNGS: Normal breath sounds bilaterally, no wheezing, rales,rhonchi or crepitation. No use of accessory muscles of respiration.  CARDIOVASCULAR: S1, S2 normal. No murmurs, rubs, or gallops.  ABDOMEN: Soft, nontender, nondistended. Bowel sounds present. No organomegaly or mass.  EXTREMITIES: No pedal edema, cyanosis, or clubbing.  NEUROLOGIC: Cranial nerves II through XII are intact. Muscle strength 5/5 in all extremities. Sensation intact. Gait not checked.  PSYCHIATRIC: The patient is alert and oriented x 3.  SKIN: No obvious rash, lesion, or ulcer.    LABORATORY PANEL:   CBC  Recent Labs Lab 03/04/15 0049  WBC 12.3*  HGB 12.8  HCT 37.6  PLT 164   ------------------------------------------------------------------------------------------------------------------  Chemistries   Recent Labs Lab 03/04/15 0049  NA 134*  K 3.4*  CL 100*  CO2 26  GLUCOSE 165*  BUN 10  CREATININE 0.65  CALCIUM 8.6*  AST 20  ALT 14  ALKPHOS 65  BILITOT 1.1   ------------------------------------------------------------------------------------------------------------------  Cardiac Enzymes No results for input(s): TROPONINI in the last 168 hours. ------------------------------------------------------------------------------------------------------------------  RADIOLOGY:  Dg Chest 2 View  03/04/2015    active cardiopulmonary disease.   Electronically Signed   By: Lucienne Capers M.D.   On: 03/04/2015 05:21   Ct Head Wo Contrast  03/04/2015     IMPRESSION: No acute intracranial abnormalities. Mild chronic atrophy and small vessel ischemic changes.   Electronically Signed   By: Lucienne Capers M.D.   On: 03/04/2015 05:22    EKG:  No ST elevation or depression  IMPRESSION AND PLAN:  This is a 74 year old female who was mowing her lawn presented with fever and hypotension.  1. Sepsis: Patient reported a fever of 103 at home. She also was noted to have hypotension. Unclear etiology at this time. I will place her on broad-spectrum into [place] and continue to monitor her. We will follow up on blood cultures that were ordered in the emergency department. Her chest x-ray and urinalysis was negative for infection.  2. Hypotension: Likely this is from dehydration and/or sepsis. I will provide IV fluids and continue to monitor blood pressure.  3. Hypothyroidism: We will continue Synthroid.  4. History of LEE Patient is on  HCTZ PRN at home. Due to her low blood pressure and holding his medications.  5. Palpiations: She is on Metoprolol. I  am holding this due to hypotension.    All the records are reviewed and case discussed with ED provider. Management plans discussed with the patient and she is in agreement.  CODE STATUS: full  TOTAL TIME TAKING CARE OF THIS PATIENT: 74 minutes.    Britteney Ayotte M.D on 03/04/2015 at 11:01 AM  Between 7am to 6pm - Pager - [phone] After 6pm go to [email or web address] - password [email or web address] [facility]  [place] [facility]  Office  [phone]  CC: Primary care physician; No PCP Per Patient

## 2015-03-04 NOTE — ED Notes (Signed)
Pt assisted up to restroom to void.  

## 2015-03-04 NOTE — ED Notes (Signed)
Dr. Dahlia Client in to see pt. Pt continues with hypotension, md aware.

## 2015-03-04 NOTE — Progress Notes (Signed)
Dr Benjie Karvonen, patient has allergy to motrin. Orders received cooling blanket, give ibuprofen

## 2015-03-04 NOTE — Progress Notes (Signed)
Dr Benjie Karvonen patient has temperature 103.4 after dose of tylenol Give 800 mg ibuprofen po once

## 2015-03-04 NOTE — ED Notes (Signed)
Pt sleeping, resps unlabored. Skin pwd.

## 2015-03-05 ENCOUNTER — Encounter: Payer: Self-pay | Admitting: Radiology

## 2015-03-05 ENCOUNTER — Inpatient Hospital Stay: Payer: PPO

## 2015-03-05 LAB — BASIC METABOLIC PANEL
ANION GAP: 5 (ref 5–15)
BUN: 7 mg/dL (ref 6–20)
CHLORIDE: 110 mmol/L (ref 101–111)
CO2: 25 mmol/L (ref 22–32)
CREATININE: 0.69 mg/dL (ref 0.44–1.00)
Calcium: 8.1 mg/dL — ABNORMAL LOW (ref 8.9–10.3)
Glucose, Bld: 116 mg/dL — ABNORMAL HIGH (ref 65–99)
POTASSIUM: 4.1 mmol/L (ref 3.5–5.1)
Sodium: 140 mmol/L (ref 135–145)

## 2015-03-05 LAB — CBC
HCT: 33.9 % — ABNORMAL LOW (ref 35.0–47.0)
HEMOGLOBIN: 11.7 g/dL — AB (ref 12.0–16.0)
MCH: 29.7 pg (ref 26.0–34.0)
MCHC: 34.7 g/dL (ref 32.0–36.0)
MCV: 85.7 fL (ref 80.0–100.0)
Platelets: 146 10*3/uL — ABNORMAL LOW (ref 150–440)
RBC: 3.95 MIL/uL (ref 3.80–5.20)
RDW: 14.3 % (ref 11.5–14.5)
WBC: 13.2 10*3/uL — ABNORMAL HIGH (ref 3.6–11.0)

## 2015-03-05 MED ORDER — APIXABAN 5 MG PO TABS
10.0000 mg | ORAL_TABLET | Freq: Two times a day (BID) | ORAL | Status: DC
  Administered 2015-03-05 – 2015-03-07 (×4): 10 mg via ORAL
  Filled 2015-03-05: qty 28
  Filled 2015-03-05 (×3): qty 1
  Filled 2015-03-05: qty 28

## 2015-03-05 MED ORDER — APIXABAN 5 MG PO TABS: 5.0000 mg | ORAL_TABLET | Freq: Two times a day (BID) | ORAL | Status: DC

## 2015-03-05 MED ORDER — LORAZEPAM 1 MG PO TABS
1.0000 mg | ORAL_TABLET | Freq: Every evening | ORAL | Status: DC | PRN
  Administered 2015-03-05: 21:00:00 1 mg via ORAL
  Filled 2015-03-05: qty 1

## 2015-03-05 MED ORDER — SODIUM CHLORIDE 0.9 % IJ SOLN
3.0000 mL | Freq: Three times a day (TID) | INTRAMUSCULAR | Status: DC
  Administered 2015-03-05 – 2015-03-06 (×3): 3 mL via INTRAVENOUS

## 2015-03-05 MED ORDER — VANCOMYCIN HCL IN DEXTROSE 1-5 GM/200ML-% IV SOLN
1000.0000 mg | Freq: Once | INTRAVENOUS | Status: AC
  Administered 2015-03-05: 22:00:00 1000 mg via INTRAVENOUS
  Filled 2015-03-05: qty 200

## 2015-03-05 MED ORDER — BISACODYL 5 MG PO TBEC
10.0000 mg | DELAYED_RELEASE_TABLET | Freq: Every day | ORAL | Status: DC
  Administered 2015-03-05 – 2015-03-06 (×2): 10 mg via ORAL
  Filled 2015-03-05 (×3): qty 2

## 2015-03-05 MED ORDER — IOHEXOL 350 MG/ML SOLN
75.0000 mL | Freq: Once | INTRAVENOUS | Status: AC | PRN
  Administered 2015-03-05: 75 mL via INTRAVENOUS

## 2015-03-05 MED ORDER — SALINE SPRAY 0.65 % NA SOLN
1.0000 | NASAL | Status: DC | PRN
  Administered 2015-03-05: 07:00:00 1 via NASAL
  Filled 2015-03-05: qty 44

## 2015-03-05 NOTE — Progress Notes (Signed)
TC to Dr.Willis informing him pt's temp 103.2 after tylenol, cooling room, adding vancomycin to current Zosyn therapy. Advised to apply cooling blanket. Oncoming nurse, Malka advised and will FU.

## 2015-03-05 NOTE — Plan of Care (Signed)
Problem: Discharge Progression Outcomes Goal: Other Discharge Outcomes/Goals Outcome: Progressing Pt had no c/o pain this shift Patient VSS except BP was low earlier in shift, MD aware and metoprolol was suspended  Patient is tolerating diet well Possible discharge to home

## 2015-03-05 NOTE — Progress Notes (Signed)
ANTICOAGULATION CONSULT NOTE - Initial Consult  Pharmacy Consult for Apixaban Indication: pulmonary embolus  Allergies  Allergen Reactions  . Motrin [Ibuprofen] Other (See Comments)    depression  . Oxycodone Nausea Only  . Latex Rash    Patient Measurements: Height: 5\' 4"  (162.6 cm) Weight: 169 lb 7 oz (76.856 kg) IBW/kg (Calculated) : 54.7   Vital Signs: Temp: 98.8 F (37.1 C) (06/14 0644) Temp Source: Oral (06/14 0644) BP: 85/50 mmHg (06/14 1009) Pulse Rate: 77 (06/14 1009)  Labs:  Recent Labs  03/04/15 0049 03/04/15 0700 03/05/15 0534  HGB 12.8  --  11.7*  HCT 37.6  --  33.9*  PLT 164  --  146*  CREATININE 0.65  --  0.69  CKTOTAL  --  43  --   CKMB  --  1.3  --     Estimated Creatinine Clearance: 62.9 mL/min (by C-G formula based on Cr of 0.69).   Medical History: Past Medical History  Diagnosis Date  . Thyroid disease   . Tick fever   . Brain infection   . Tachycardia   . Breast cancer     Left side mastectomy    Medications:  Scheduled:  . apixaban  10 mg Oral BID  . [START ON 03/12/2015] apixaban  5 mg Oral BID  . ibuprofen  800 mg Oral Once  . levothyroxine  50 mcg Oral BH-q7a  . piperacillin-tazobactam (ZOSYN)  IV  3.375 g Intravenous 3 times per day    Assessment: 74 yo female admitted with fever, SOB now with +Pulmonary embolus on Chest CT. Patient on Heparin subcutaneous q8h currently to start Apixaban for PE treatment.  Goal of Therapy:  Monitor platelets by anticoagulation protocol: Yes   Plan:  Will begin Apixaban 10mg  po BID x 7 days for PE TREATMENT and then continue with Apixaban 5 mg po bid. Will discontinue heparin.  Chinita Greenland PharmD Clinical Pharmacist 03/05/2015

## 2015-03-05 NOTE — Progress Notes (Signed)
[place] [facility] [place] NAME: Debra Patel    MR#:  [id number]  DATE OF BIRTH:  16-Jan-1941  SUBJECTIVE:  Patient is doing fair today. She reports that she feels uncomfortable when she takes a deep breath. She denies shortness of breath or chest pain.  REVIEW OF SYSTEMS:    Review of Systems  Constitutional: Positive for malaise/fatigue. Negative for fever and chills.  HENT: Negative for sore throat.   Eyes: Negative for blurred vision.  Respiratory: Negative for cough, hemoptysis, shortness of breath (she feels that she has some issues with breathing when taking in deep breath) and wheezing.   Cardiovascular: Negative for chest pain, palpitations and leg swelling.  Gastrointestinal: Negative for nausea, vomiting, abdominal pain, diarrhea and blood in stool.  Genitourinary: Negative for dysuria.  Musculoskeletal: Negative for back pain.  Neurological: Negative for dizziness, tremors and headaches.  Endo/Heme/Allergies: Does not bruise/bleed easily.  Psychiatric/Behavioral: Negative for depression.    Tolerating Diet: Yes but she does not want to eat very much.      DRUG ALLERGIES:   Allergies  Allergen Reactions  . Motrin [Ibuprofen] Other (See Comments)    depression  . Oxycodone Nausea Only  . Latex Rash    VITALS:  Blood pressure 85/50, pulse 77, temperature 98.8 F (37.1 C), temperature source Oral, resp. rate 18, height 5\' 4"  (1.626 m), weight 76.856 kg (169 lb 7 oz), SpO2 95 %.  PHYSICAL EXAMINATION:   Physical Exam  Constitutional: She is oriented to person, place, and time and well-developed, well-nourished, and in no distress. No distress.  HENT:  Head: Normocephalic.  Eyes: No scleral icterus.  Neck: Normal range of motion. Neck supple. No JVD present. No tracheal deviation present.  Cardiovascular: Normal rate, regular rhythm and normal heart sounds.  Exam reveals no gallop and no friction rub.   No  murmur heard. Pulmonary/Chest: Effort normal and breath sounds normal. No respiratory distress. She has no wheezes. She has no rales. She exhibits no tenderness.  Abdominal: Soft. Bowel sounds are normal. She exhibits no distension and no mass. There is no tenderness. There is no rebound and no guarding.  Musculoskeletal: Normal range of motion. She exhibits no edema.  Neurological: She is alert and oriented to person, place, and time.  Skin: Skin is warm. No rash noted. No erythema.  Psychiatric: Affect and judgment normal.      LABORATORY PANEL:   CBC  Recent Labs Lab 03/05/15 0534  WBC 13.2*  HGB 11.7*  HCT 33.9*  PLT 146*   ------------------------------------------------------------------------------------------------------------------  Chemistries   Recent Labs Lab 03/04/15 0049 03/05/15 0534  NA 134* 140  K 3.4* 4.1  CL 100* 110  CO2 26 25  GLUCOSE 165* 116*  BUN 10 7  CREATININE 0.65 0.69  CALCIUM 8.6* 8.1*  AST 20  --   ALT 14  --   ALKPHOS 65  --   BILITOT 1.1  --    ------------------------------------------------------------------------------------------------------------------  Cardiac Enzymes No results for input(s): TROPONINI in the last 168 hours. ------------------------------------------------------------------------------------------------------------------  RADIOLOGY:  Dg Chest 2 View  03/04/2015   CLINICAL DATA:  Difficulty with deep breathing for 2 days. ED exposure 2 days ago.  EXAM: CHEST  2 VIEW  COMPARISON:  06/28/2013  FINDINGS: Normal heart size and pulmonary vascularity. No focal airspace disease or consolidation in the lungs. No blunting of costophrenic angles. No pneumothorax. Mediastinal contours appear intact. Degenerative changes in the spine. Postoperative changes in the  right shoulder.  IMPRESSION: No active cardiopulmonary disease.   Electronically Signed   By: Lucienne Capers M.D.   On: 03/04/2015 05:21   Ct Head Wo  Contrast  03/04/2015   CLINICAL DATA:  Feet exposure yesterday for our. Patient fell back, was disoriented, an and fever and chills. Vomiting and nausea today.  EXAM: CT HEAD WITHOUT CONTRAST  TECHNIQUE: Contiguous axial images were obtained from the base of the skull through the vertex without intravenous contrast.  COMPARISON:  None.  FINDINGS: Diffuse atrophy. Mild ventricular dilatation consistent with central atrophy. Low-attenuation changes in the deep white matter consistent with small vessel ischemia. No mass effect or midline shift. No abnormal extra-axial fluid collections. Gray-white matter junctions are distinct. Basal cisterns are not effaced. No evidence of acute intracranial hemorrhage. No depressed skull fractures. Visualized paranasal sinuses and mastoid air cells are not opacified.  IMPRESSION: No acute intracranial abnormalities. Mild chronic atrophy and small vessel ischemic changes.   Electronically Signed   By: Lucienne Capers M.D.   On: 03/04/2015 05:22     ASSESSMENT AND PLAN:   This is a 74 year old female with a history of hypothyroidism and tachycardia on metoprolol who presented with fever.  1. Sepsis: Patient presented with fever and hypotension. These have now resolved. I suspect her fever was due to heat exhaustion. Her hypotension was likely secondary to dehydration. She is currently on anti-biotics due to her initial sepsis. Sepsis has not been ruled out. I will continue anti-biotics for 24 hours and if blood cultures remain negative I will stop these anti-biotics.  2. Hypertension: Initially this was likely secondary to dehydration. Patient was restarted on her metoprolol due to palpitations she had yesterday, however this has now decreased her blood pressure. I will stop the metoprolol. We will continue to monitor blood pressure.  3. Shortness of breath on deep inspiration: I will evaluate for pulmonary emboli with a CT scan.  4. Hypothyroid: Continue  Synthroid.  5. History of lower extremity edema: Patient is on HCTZ when necessary. Due to her low blood pressure holding his medications.        Management plans discussed with the patient and she is in agreement.  CODE STATUS: Full  TOTAL TIME TAKING CARE OF THIS PATIENT: 27 minutes.   Greater than 50% counseling and coordination of care  POSSIBLE D/C home in 1 day , DEPENDING ON CLINICAL CONDITION.   Elliot Meldrum M.D on 03/05/2015 at 11:30 AM  Between 7am to 6pm - Pager - [phone] After 6pm go to [email or web address] - password [email or web address] [facility]  [place] [facility]  Office  [phone]  CC: Primary care physician; No PCP Per Patient

## 2015-03-05 NOTE — Progress Notes (Signed)
Patient underwent CT scan this afternoon. I discussed findings with her. She has small PE RUL. We discussed tx options including AC with coumadin vs newer agents. She has decided on starting [facility] and to start Eliquis due to lower GIB risk profile. I thoroughly reviewed Se/a/b/r of AC. She accepts risk. I have called pharmacy to start Eliquis and d/ c heparin [place].  TIME 27 minutes

## 2015-03-05 NOTE — Progress Notes (Signed)
Dr. Reece Levy notified of patients c/o sinus congestion/blockage to right nare, pt requesting nasal spray. New order received for saline nasal spray 1 spray to affected nostril PRN.

## 2015-03-05 NOTE — Plan of Care (Signed)
Problem: Discharge Progression Outcomes Goal: Other Discharge Outcomes/Goals Outcome: Progressing Plan of Care Progressing to Goal: Pain-No complaints of pain.  Hemodynamically stable- WBC's elevated, IVF and IV antibiotic infused per MD order; VSS Complications- temperature WNL, cooling blanket removed from patient. Diet- tolerating, no c/o nausea or vomiting. Moderate fall risk, up to BR with stand by assist; no falls or injuries this shift.

## 2015-03-05 NOTE — Progress Notes (Signed)
TC to Dr. Tressia Miners earlier with pt request for ducolax tab and ativan with new orders received.  TC to Dr. Lillia Mountain reporting fever, reviewed current antibiotic, labs; pt reports 'just don't feel good" and intermittent pain only on deep inspiration- new order received.

## 2015-03-06 ENCOUNTER — Inpatient Hospital Stay: Payer: PPO

## 2015-03-06 LAB — CBC
HCT: 35.7 % (ref 35.0–47.0)
Hemoglobin: 11.9 g/dL — ABNORMAL LOW (ref 12.0–16.0)
MCH: 28.9 pg (ref 26.0–34.0)
MCHC: 33.4 g/dL (ref 32.0–36.0)
MCV: 86.6 fL (ref 80.0–100.0)
PLATELETS: 164 10*3/uL (ref 150–440)
RBC: 4.13 MIL/uL (ref 3.80–5.20)
RDW: 14.4 % (ref 11.5–14.5)
WBC: 10.4 10*3/uL (ref 3.6–11.0)

## 2015-03-06 LAB — HEPATIC FUNCTION PANEL
ALK PHOS: 66 U/L (ref 38–126)
ALT: 14 U/L (ref 14–54)
AST: 15 U/L (ref 15–41)
Albumin: 2.8 g/dL — ABNORMAL LOW (ref 3.5–5.0)
Bilirubin, Direct: 0.1 mg/dL (ref 0.1–0.5)
Indirect Bilirubin: 0.3 mg/dL (ref 0.3–0.9)
TOTAL PROTEIN: 6 g/dL — AB (ref 6.5–8.1)
Total Bilirubin: 0.4 mg/dL (ref 0.3–1.2)

## 2015-03-06 LAB — BASIC METABOLIC PANEL
Anion gap: 7 (ref 5–15)
BUN: 6 mg/dL (ref 6–20)
CHLORIDE: 109 mmol/L (ref 101–111)
CO2: 24 mmol/L (ref 22–32)
Calcium: 8.2 mg/dL — ABNORMAL LOW (ref 8.9–10.3)
Creatinine, Ser: 0.67 mg/dL (ref 0.44–1.00)
GFR calc Af Amer: >60 mL/min (ref >60)
GFR calc non Af Amer: >60 mL/min (ref >60)
Glucose, Bld: 125 mg/dL — ABNORMAL HIGH (ref 65–99)
Potassium: 3.9 mmol/L (ref 3.5–5.1)
Sodium: 140 mmol/L (ref 135–145)

## 2015-03-06 MED ORDER — DOXYCYCLINE HYCLATE 100 MG PO TABS
100.0000 mg | ORAL_TABLET | Freq: Two times a day (BID) | ORAL | Status: DC
  Administered 2015-03-06 – 2015-03-07 (×3): 100 mg via ORAL
  Filled 2015-03-06 (×5): qty 1

## 2015-03-06 MED ORDER — POLYETHYLENE GLYCOL 3350 17 G PO PACK
17.0000 g | PACK | Freq: Every day | ORAL | Status: DC
  Administered 2015-03-06: 17 g via ORAL
  Filled 2015-03-06 (×2): qty 1

## 2015-03-06 NOTE — Progress Notes (Signed)
Called Dr Benjie Karvonen and let her about bullsized like raised spot on left arm,  She said she had put ID consult and order doxycycline

## 2015-03-06 NOTE — Plan of Care (Signed)
Problem: Discharge Progression Outcomes Goal: Other Discharge Outcomes/Goals Plan of care progress to goals: Attempted to apply cooling blanket, but pt and daughter refused. Room temp decreased, light blanket applied, oral temp decreased. Last temp 99.1. Rest of VSS. Pt disoriented at times to time and situation. Denies pain. IVF infusing. ABX. Slept well.

## 2015-03-06 NOTE — Consult Note (Signed)
[facility] Infectious Disease     Reason for Consult: Fever    Referring Physician: Bettey Costa Date of Admission:  03/04/2015   Active Problems:   Fever   HPI: Debra Patel is a 74 y.o. female admitted 6/13 with fevers.  Since admit has had CT with small PE and small basilar infiltrate (initial cxr was negative).  Has been treated with broad spectrum abx but fevers persist.  UA, UCX and BCX negative.   She was in her usual [facility] prior, was active in garden and mowing lawns.  Had some increased fatigue over last few days prior but fevers began day of admission. Was not coughing, no sob.  Did have some CP and was being worked up with cardiology. Has mild chronic edema in LE but no calf pain or tenderness.  No ST, LAN, neck pain. Some mild Headache.  No n/v/d.  Is chronically constipated. Had some mild RLQ abd pain prior as well but resolved. Does not recall tick bite but has lesion on L forearm that was red recently. Has 2 pet dogs at home.  Past Medical History  Diagnosis Date  . Thyroid disease   . Tick fever   . Brain infection   . Tachycardia   . Breast cancer     Left side mastectomy   Past Surgical History  Procedure Laterality Date  . Abdominal hysterectomy    . Mastectomy     History  Substance Use Topics  . Smoking status: Never Smoker   . Smokeless tobacco: Not on file  . Alcohol Use: No   No family history on file.  Allergies:  Allergies  Allergen Reactions  . Motrin [Ibuprofen] Other (See Comments)    depression  . Oxycodone Nausea Only  . Latex Rash    Current antibiotics: Antibiotics Given (last 72 hours)    Date/Time Action Medication Dose Rate   03/04/15 1121 Given   piperacillin-tazobactam (ZOSYN) IVPB 3.375 g 3.375 g 12.5 mL/hr   03/04/15 2112 Given   piperacillin-tazobactam (ZOSYN) IVPB 3.375 g 3.375 g 12.5 mL/hr   03/05/15 0542 Given   piperacillin-tazobactam (ZOSYN) IVPB 3.375 g 3.375 g 12.5 mL/hr   03/05/15 1514 Given    piperacillin-tazobactam (ZOSYN) IVPB 3.375 g 3.375 g 12.5 mL/hr   03/05/15 2145 Given   piperacillin-tazobactam (ZOSYN) IVPB 3.375 g 3.375 g 12.5 mL/hr   03/05/15 2145 Given   vancomycin (VANCOCIN) IVPB 1000 mg/200 mL premix 1,000 mg 200 mL/hr   03/06/15 0618 Given   piperacillin-tazobactam (ZOSYN) IVPB 3.375 g 3.375 g 12.5 mL/hr   03/06/15 1133 Given   doxycycline (VIBRA-TABS) tablet 100 mg 100 mg       MEDICATIONS: . apixaban  10 mg Oral BID  . [START ON 03/12/2015] apixaban  5 mg Oral BID  . bisacodyl  10 mg Oral Daily  . doxycycline  100 mg Oral Q12H  . ibuprofen  800 mg Oral Once  . levothyroxine  50 mcg Oral BH-q7a  . piperacillin-tazobactam (ZOSYN)  IV  3.375 g Intravenous 3 times per day  . polyethylene glycol  17 g Oral Daily  . sodium chloride  3 mL Intravenous Q8H    Review of Systems - 11 systems reviewed and negative per HPI   OBJECTIVE: Temp:  [97.8 F (36.6 C)-103.2 F (39.6 C)] 98 F (36.7 C) (06/15 1343) Pulse Rate:  [75-118] 75 (06/15 1343) Resp:  [18-20] 18 (06/15 0830) BP: (112-141)/(63-79) 141/79 mmHg (06/15 1343) SpO2:  [96 %-98 %] 97 % (  06/15 1343) Physical Exam  Constitutional: She is oriented to person, place, and time. appears well-developed and well-nourished. No distress.  HENT Mouth/Throat: Oropharynx is clear and moist. No oropharyngeal exudate.  Neck supple, Cardiovascular: Normal rate, regular rhythm and normal heart sounds. Exam reveals no gallop and no friction rub.  No murmur heard.  Pulmonary/Chest: Effort normal and breath sounds normal. No respiratory distress. He has no wheezes.  Abdominal: Soft. Bowel sounds are normal. He exhibits no distension. There is no tenderness.  Lymphadenopathy:   no cervical adenopathy.  Neurological: alert and oriented to person, place, and time.  Skin: Skin is warm and dry. R forearm with 1 cm area of raised lesion, desquamation surrounding with mild erythema.  Psychiatric: He has a normal mood and  affect. His behavior is normal.     LABS: Results for orders placed or performed during the hospital encounter of 03/04/15 (from the past 48 hour(s))  Basic metabolic panel     Status: Abnormal   Collection Time: 03/05/15  5:34 AM  Result Value Ref Range   Sodium 140 135 - 145 mmol/L   Potassium 4.1 3.5 - 5.1 mmol/L   Chloride 110 101 - 111 mmol/L   CO2 25 22 - 32 mmol/L   Glucose, Bld 116 (H) 65 - 99 mg/dL   BUN 7 6 - 20 mg/dL   Creatinine, Ser 0.69 0.44 - 1.00 mg/dL   Calcium 8.1 (L) 8.9 - 10.3 mg/dL   GFR calc non Af Amer >60 >60 mL/min   GFR calc Af Amer >60 >60 mL/min    Comment: (NOTE) The eGFR has been calculated using the CKD EPI equation. This calculation has not been validated in all clinical situations. eGFR's persistently <60 mL/min signify possible Chronic Kidney Disease.    Anion gap 5 5 - 15  CBC     Status: Abnormal   Collection Time: 03/05/15  5:34 AM  Result Value Ref Range   WBC 13.2 (H) 3.6 - 11.0 K/uL   RBC 3.95 3.80 - 5.20 MIL/uL   Hemoglobin 11.7 (L) 12.0 - 16.0 g/dL   HCT 33.9 (L) 35.0 - 47.0 %   MCV 85.7 80.0 - 100.0 fL   MCH 29.7 26.0 - 34.0 pg   MCHC 34.7 32.0 - 36.0 g/dL   RDW 14.3 11.5 - 14.5 %   Platelets 146 (L) 150 - 440 K/uL  CBC     Status: Abnormal   Collection Time: 03/06/15  5:34 AM  Result Value Ref Range   WBC 10.4 3.6 - 11.0 K/uL   RBC 4.13 3.80 - 5.20 MIL/uL   Hemoglobin 11.9 (L) 12.0 - 16.0 g/dL   HCT 35.7 35.0 - 47.0 %   MCV 86.6 80.0 - 100.0 fL   MCH 28.9 26.0 - 34.0 pg   MCHC 33.4 32.0 - 36.0 g/dL   RDW 14.4 11.5 - 14.5 %   Platelets 164 150 - 440 K/uL  Basic metabolic panel     Status: Abnormal   Collection Time: 03/06/15  5:34 AM  Result Value Ref Range   Sodium 140 135 - 145 mmol/L   Potassium 3.9 3.5 - 5.1 mmol/L   Chloride 109 101 - 111 mmol/L   CO2 24 22 - 32 mmol/L   Glucose, Bld 125 (H) 65 - 99 mg/dL   BUN 6 6 - 20 mg/dL   Creatinine, Ser 0.67 0.44 - 1.00 mg/dL   Calcium 8.2 (L) 8.9 - 10.3 mg/dL   GFR  calc non  Af Amer >60 >60 mL/min   GFR calc Af Amer >60 >60 mL/min    Comment: (NOTE) The eGFR has been calculated using the CKD EPI equation. This calculation has not been validated in all clinical situations. eGFR's persistently <60 mL/min signify possible Chronic Kidney Disease.    Anion gap 7 5 - 15  Hepatic function panel     Status: Abnormal   Collection Time: 03/06/15  5:34 AM  Result Value Ref Range   Total Protein 6.0 (L) 6.5 - 8.1 g/dL   Albumin 2.8 (L) 3.5 - 5.0 g/dL   AST 15 15 - 41 U/L   ALT 14 14 - 54 U/L   Alkaline Phosphatase 66 38 - 126 U/L   Total Bilirubin 0.4 0.3 - 1.2 mg/dL   Bilirubin, Direct 0.1 0.1 - 0.5 mg/dL   Indirect Bilirubin 0.3 0.3 - 0.9 mg/dL   No components found for: ESR, C REACTIVE PROTEIN MICRO: Recent Results (from the past 720 hour(s))  Culture, blood (routine x 2)     Status: None (Preliminary result)   Collection Time: 03/04/15  7:00 AM  Result Value Ref Range Status   Specimen Description BLOOD  Final   Special Requests RFA  Final   Culture NO GROWTH 2 DAYS  Final   Report Status PENDING  Incomplete  Culture, blood (routine x 2)     Status: None (Preliminary result)   Collection Time: 03/04/15  7:10 AM  Result Value Ref Range Status   Specimen Description BLOOD RIGHT FOREARM  Final   Special Requests Normal  Final   Culture NO GROWTH 2 DAYS  Final   Report Status PENDING  Incomplete    IMAGING: Dg Chest 2 View  03/04/2015   CLINICAL DATA:  Difficulty with deep breathing for 2 days. ED exposure 2 days ago.  EXAM: CHEST  2 VIEW  COMPARISON:  06/28/2013  FINDINGS: Normal heart size and pulmonary vascularity. No focal airspace disease or consolidation in the lungs. No blunting of costophrenic angles. No pneumothorax. Mediastinal contours appear intact. Degenerative changes in the spine. Postoperative changes in the right shoulder.  IMPRESSION: No active cardiopulmonary disease.   Electronically Signed   By: Lucienne Capers M.D.   On:  03/04/2015 05:21   Dg Abd 1 View  03/06/2015   CLINICAL DATA:  Abdominal pain.  EXAM: ABDOMEN - 1 VIEW  COMPARISON:  None.  FINDINGS: The bowel gas pattern is normal. Multiple pelvic phleboliths noted. No definite radiopaque calculi visualized. Lumbar spine degenerative changes noted  IMPRESSION: Normal bowel gas pattern.  No acute findings.   Electronically Signed   By: Earle Gell M.D.   On: 03/06/2015 12:25   Ct Head Wo Contrast  03/04/2015   CLINICAL DATA:  Feet exposure yesterday for our. Patient fell back, was disoriented, an and fever and chills. Vomiting and nausea today.  EXAM: CT HEAD WITHOUT CONTRAST  TECHNIQUE: Contiguous axial images were obtained from the base of the skull through the vertex without intravenous contrast.  COMPARISON:  None.  FINDINGS: Diffuse atrophy. Mild ventricular dilatation consistent with central atrophy. Low-attenuation changes in the deep white matter consistent with small vessel ischemia. No mass effect or midline shift. No abnormal extra-axial fluid collections. Gray-white matter junctions are distinct. Basal cisterns are not effaced. No evidence of acute intracranial hemorrhage. No depressed skull fractures. Visualized paranasal sinuses and mastoid air cells are not opacified.  IMPRESSION: No acute intracranial abnormalities. Mild chronic atrophy and small vessel ischemic changes.  Electronically Signed   By: Lucienne Capers M.D.   On: 03/04/2015 05:22   Ct Angio Chest Pe W/cm &/or Wo Cm  03/05/2015   CLINICAL DATA:  Two week history of chest pain. Ten day history of shortness of breath. History of breast carcinoma  EXAM: CT ANGIOGRAPHY CHEST WITH CONTRAST  TECHNIQUE: Multidetector CT imaging of the chest was performed using the standard protocol during bolus administration of intravenous contrast. Multiplanar CT image reconstructions and MIPs were obtained to evaluate the vascular anatomy.  CONTRAST:  75 mL Omnipaque 350 nonionic  COMPARISON:  Chest CT March 30, 2006; chest radiograph March 04, 2015  FINDINGS: There is a small pulmonary embolus in a branch of the posterior segment of the right upper lobe pulmonary artery. No more central or larger pulmonary emboli are appreciable on this study. There is no thoracic aortic aneurysm or dissection.  There is airspace consolidation in each lung base with small pleural effusions bilaterally. There is atelectatic change in the posterior segment right upper lobe focally.  Patient is status post left mastectomy.  Thyroid appears normal.  There are small mediastinal lymph nodes but no frank adenopathy by size criteria. There is soft tissue thickening adjacent to the aortic arch, likely post radiation therapy change. Some similar scarring is noted in the aortopulmonary window region, likely due to previous radiation therapy. Pericardium is not appreciably thickened.  There is a small hiatal hernia. In the visualized upper abdomen, there is an apparent 1 x 1 cm cyst in the dome of the liver anteriorly.  There is degenerative change in the thoracic spine. There are no blastic or lytic bone lesions.  Review of the MIP images confirms the above findings.  IMPRESSION: There is a small pulmonary embolus in a branch of the posterior segment of the right upper lobe pulmonary artery. No larger or more central pulmonary emboli are identified. There is no evidence of right heart strain.  There is bibasilar airspace consolidation with small pleural effusions bilaterally.  There is postoperative and post radiation therapy change on the left. No demonstrable adenopathy by size criteria.  Critical Value/emergent results were called by telephone at the time of interpretation on 03/05/2015 at 2:22 pm to Dr. Ulice Bold MODY , who verbally acknowledged these results.   Electronically Signed   By: Lowella Grip III M.D.   On: 03/05/2015 14:22    Assessment:   Debra Patel is a 74 y.o. female quite healthy and active at baseline female admitted 6/13  with fevers.  Since admit has had CT with small PE and small basilar infiltrate (initial cxr was negative).  Has been treated with broad spectrum abx but fevers persist.  UA, UCX and BCX negative.  Has lesion on L forearm. Does not recall tick bite but is active in garden I doubt she has PNA as main cause as had no cough, initial cxr negative.  Suspect CT findings more related to fludi resusiciation and vol overload.  USS LE pending. Plts did drop slightly but lfts nml.  Recommendations Continue zosyn and started doxy I have cultured the L forearm lesion Check RMSF and Ehrlichia serology If afebrile overnight can dc on doxy 100 bid for 5 days and levo for 7 days total abx since admit Thank you very much for allowing me to participate in the care of this patient. Please call with questions.   Cheral Marker. Ola Spurr, MD

## 2015-03-06 NOTE — Progress Notes (Signed)
[place] [facility] [place] NAME: Debra Patel    MR#:  [id number]  DATE OF BIRTH:  09-30-1940  SUBJECTIVE:  Patient not doing well this am + nausea no abdominal pain fevet last night  REVIEW OF SYSTEMS:    Review of Systems  Constitutional: Positive for fever and malaise/fatigue. Negative for chills.  HENT: Negative for sore throat.   Eyes: Negative for blurred vision.  Respiratory: Negative for cough, hemoptysis, shortness of breath (she feels that she has some issues with breathing when taking in deep breath) and wheezing.   Cardiovascular: Negative for chest pain, palpitations and leg swelling.  Gastrointestinal: Positive for nausea. Negative for heartburn, vomiting, abdominal pain, diarrhea and blood in stool.  Genitourinary: Negative for dysuria.  Musculoskeletal: Negative for back pain.  Skin: Negative for itching and rash.  Neurological: Positive for weakness. Negative for dizziness, tremors and headaches.  Endo/Heme/Allergies: Does not bruise/bleed easily.  Psychiatric/Behavioral: Negative for depression.    Tolerating Diet: No nausea.      DRUG ALLERGIES:   Allergies  Allergen Reactions  . Motrin [Ibuprofen] Other (See Comments)    depression  . Oxycodone Nausea Only  . Latex Rash    VITALS:  Blood pressure 129/64, pulse 78, temperature 97.8 F (36.6 C), temperature source Oral, resp. rate 18, height 5\' 4"  (1.626 m), weight 76.856 kg (169 lb 7 oz), SpO2 97 %.  PHYSICAL EXAMINATION:   Physical Exam  Constitutional: She is oriented to person, place, and time and well-developed, well-nourished, and in no distress. No distress.  HENT:  Head: Normocephalic.  Eyes: No scleral icterus.  Neck: Normal range of motion. Neck supple. No JVD present. No tracheal deviation present.  Cardiovascular: Normal rate, regular rhythm and normal heart sounds.  Exam reveals no gallop and no friction rub.   No murmur  heard. Pulmonary/Chest: Effort normal and breath sounds normal. No respiratory distress. She has no wheezes. She has no rales. She exhibits no tenderness.  Abdominal: Soft. Bowel sounds are normal. She exhibits no distension and no mass. There is no tenderness. There is no rebound and no guarding.  Musculoskeletal: Normal range of motion. She exhibits no edema.  Neurological: She is alert and oriented to person, place, and time.  Skin: Skin is warm. No rash noted. No erythema.  Psychiatric: Affect and judgment normal.      LABORATORY PANEL:   CBC  Recent Labs Lab 03/06/15 0534  WBC 10.4  HGB 11.9*  HCT 35.7  PLT 164   ------------------------------------------------------------------------------------------------------------------  Chemistries   Recent Labs Lab 03/04/15 0049  03/06/15 0534  NA 134*  < > 140  K 3.4*  < > 3.9  CL 100*  < > 109  CO2 26  < > 24  GLUCOSE 165*  < > 125*  BUN 10  < > 6  CREATININE 0.65  < > 0.67  CALCIUM 8.6*  < > 8.2*  AST 20  --   --   ALT 14  --   --   ALKPHOS 65  --   --   BILITOT 1.1  --   --   < > = values in this interval not displayed. ------------------------------------------------------------------------------------------------------------------  Cardiac Enzymes No results for input(s): TROPONINI in the last 168 hours. ------------------------------------------------------------------------------------------------------------------  RADIOLOGY:  Ct Angio Chest Pe W/cm &/or Wo Cm  03/05/2015     IMPRESSION: There is a small pulmonary embolus in a branch of the posterior segment of the right  upper lobe pulmonary artery. No larger or more central pulmonary emboli are identified. There is no evidence of right heart strain.  There is bibasilar airspace consolidation with small pleural effusions bilaterally.  There is postoperative and post radiation therapy change on the left. No demonstrable adenopathy by size criteria.  Critical  Value/emergent results were called by telephone at the time of interpretation on 03/05/2015 at 2:22 pm to Dr. Ulice Bold Jennetta Flood , who verbally acknowledged these results.   Electronically Signed   By: Lowella Grip III M.D.   On: 03/05/2015 14:22     ASSESSMENT AND PLAN:   This is a 74 year old female with a history of hypothyroidism and tachycardia on metoprolol who presented with fever.  1. Sepsis: Patient presented with fever and hypotension. She continues to have a fever. She does have a small PE which could cause fever. I spoke with ID this am. Dr. Ola Spurr recommends to start on DOXY for possible TIC borne illness. I will also order LFTS to r/o RMSF. Her CT scan shows consolidation with likely CAP ( not initially seen on CXR on admission) I will continue ZOSYN and order ISS.   2. Hypotension: This has now resolved.Continue to hold MEeoprol.  3.PE: I have discussed findings of CT with patient. She is on ELIQUIS now. LEE dopplers ordered due to persistent fevers. I reviewed SE/a/b/r of AC with patient. She accepts these risks.   4. Hypothyroid: Continue Synthroid.  5. History of lower extremity edema: Patient is on HCTZ when necessary. Due to her low blood pressure holding his medications.  6. Nausea with constipation: I will order KUB and miralax.    Management plans discussed with the patient and she is in agreement.  CODE STATUS: Full  TOTAL TIME TAKING CARE OF THIS PATIENT: 35 minutes.   Greater than 50% time counseling and coordination of care  POSSIBLE D/C home in 1-2 day , DEPENDING ON CLINICAL CONDITION.   Sausha Raymond M.D on 03/06/2015 at 8:40 AM  Between 7am to 6pm - Pager - [phone] After 6pm go to [email or web address] - password [email or web address] [facility]  [facility]  Office  [phone]  CC: Primary care physician; No PCP Per Patient

## 2015-03-06 NOTE — Plan of Care (Signed)
Problem: Discharge Progression Outcomes Goal: Other Discharge Outcomes/Goals Outcome: Progressing Patient had no c/o pain this shift Patient was A-febrile this shift, with VSS Patient is tolerating diet well with no nausea  Patient is a stand by assist to the bathroom Patient expects to discharge to home

## 2015-03-06 NOTE — Clinical Documentation Improvement (Deleted)
"  Hypertension: Initially this was likely secondary to dehydration." is documented in the H&P.  Please clarify the correct diagnosis for the statement noted above: Hypertension or Hypotension.    Thank You, Erling Conte ,RN Clinical Documentation Specialist:  [phone] [place] [facility] Management

## 2015-03-07 LAB — ROCKY MTN SPOTTED FVR ABS PNL(IGG+IGM)
RMSF IGM: 0.41 {index} (ref 0.00–0.89)
RMSF IgG: NEGATIVE

## 2015-03-07 MED ORDER — APIXABAN 5 MG PO TABS: 10.0000 mg | ORAL_TABLET | Freq: Two times a day (BID) | ORAL | Status: DC

## 2015-03-07 MED ORDER — SUMATRIPTAN SUCCINATE 50 MG PO TABS
50.0000 mg | ORAL_TABLET | Freq: Once | ORAL | Status: AC
  Administered 2015-03-07: 10:00:00 50 mg via ORAL
  Filled 2015-03-07: qty 1

## 2015-03-07 MED ORDER — LEVOFLOXACIN 750 MG PO TABS: 750.0000 mg | ORAL_TABLET | Freq: Every day | ORAL | Status: DC

## 2015-03-07 MED ORDER — APIXABAN 5 MG PO TABS: 5.0000 mg | ORAL_TABLET | Freq: Two times a day (BID) | ORAL | Status: DC

## 2015-03-07 MED ORDER — DOXYCYCLINE HYCLATE 100 MG PO TABS: 100.0000 mg | ORAL_TABLET | Freq: Two times a day (BID) | ORAL | Status: DC

## 2015-03-07 MED ORDER — TRAMADOL HCL 50 MG PO TABS
50.0000 mg | ORAL_TABLET | Freq: Once | ORAL | Status: AC
  Administered 2015-03-07: 05:00:00 50 mg via ORAL
  Filled 2015-03-07: qty 1

## 2015-03-07 NOTE — Plan of Care (Signed)
Problem: Discharge Progression Outcomes Goal: Other Discharge Outcomes/Goals Plan of care progress to goal :  - Complained of pain, tylenol given with improvement. - Continues on IV fluids. - Continues on ABX. - Assist to BR.

## 2015-03-07 NOTE — Discharge Summary (Signed)
[facility] [place] NAME: Debra Patel    MR#:  [id number]  DATE OF BIRTH:  February 22, 1941  DATE OF ADMISSION:  03/04/2015 ADMITTING PHYSICIAN: Bettey Costa, MD  DATE OF DISCHARGE: 03/07/2015 10:30 AM  PRIMARY CARE PHYSICIAN: No PCP Per Patient    ADMISSION DIAGNOSIS:  Confusion [R41.0] Fever, unspecified fever cause [R50.9] Hypotension, unspecified hypotension type [I95.9]  DISCHARGE DIAGNOSIS:  Active Problems:   Fever   SECONDARY DIAGNOSIS:   Past Medical History  Diagnosis Date  . Thyroid disease   . Tick fever   . Brain infection   . Tachycardia   . Breast cancer     Left side mastectomy    HOSPITAL COURSE:  This is a 74 year old female with a history of hypothyroidism and tachycardia on metoprolol who presented with fever.  1. Sepsis: Patient presented with fever and hypotension. She was started on Zosyn. Urine and blood cultures were ordered which were negative. Due to ongoing fever she underwent a CAT scan. Her CT scan shows consolidation with may be due to CPAP. ( not initially seen on CXR on admission). I asked Dr. Ola Spurr to see the patient in consultation. Due to ongoing fevers he also recommended starting on doxycycline. RMSF antibody was ordered which is still pending. Patient is currently afebrile and doing well. Patient will be discharged with a total 5 days of doxycycline and 7 days of Levaquin.   2. Hypotension: This has now resolved.She may resume metoprolol. 3.small right upper lobe pulmonary emboli I have discussed findings of CT with patient. She is on ELIQUIS now. LEE dopplers were negative for DVT I reviewed SE/a/b/r of AC with patient. She accepts these risks. She will continue on Eliquis for at least 6 months.   4. Hypothyroid: Continue Synthroid.  5. History of lower extremity edema: Patient is on HCTZ when necessary.  6. Nausea with constipation: Resolved. 7. Headache: This appeared to be a  tension-like headache or migraine. She has significant muscle spasm on palpation. She has a history of silent migraine. She was given 1 dose of Imitrex which did help her headache.  DISCHARGE CONDITIONS AND DIET:  Patient will be discharged home in stable condition. Heart healthy diet  CONSULTS OBTAINED:  Treatment Team:  Adrian Prows, MD  DRUG ALLERGIES:   Allergies  Allergen Reactions  . Motrin [Ibuprofen] Other (See Comments)    depression  . Oxycodone Nausea Only  . Latex Rash    DISCHARGE MEDICATIONS:   Discharge Medication List as of 03/07/2015  9:45 AM    START taking these medications   Details  !! apixaban (ELIQUIS) 5 MG TABS tablet Take 1 tablet (5 mg total) by mouth 2 (two) times daily., Starting 03/12/2015, Until Discontinued, Normal    !! apixaban (ELIQUIS) 5 MG TABS tablet Take 2 tablets (10 mg total) by mouth 2 (two) times daily., Starting 03/07/2015, Until Tue 03/12/15, Normal    doxycycline (VIBRA-TABS) 100 MG tablet Take 1 tablet (100 mg total) by mouth every 12 (twelve) hours., Starting 03/07/2015, Until Discontinued, Normal    levofloxacin (LEVAQUIN) 750 MG tablet Take 1 tablet (750 mg total) by mouth daily., Starting 03/07/2015, Until Discontinued, Normal     !! - Potential duplicate medications found. Please discuss with provider.    CONTINUE these medications which have NOT CHANGED   Details  hydrochlorothiazide (HYDRODIURIL) 25 MG tablet Take 25 mg by mouth daily as needed (swelling)., Until Discontinued, Historical Med    levothyroxine (  SYNTHROID, LEVOTHROID) 50 MCG tablet Take 50 mcg by mouth every morning. (brand name only), Until Discontinued, Historical Med    metoprolol succinate (TOPROL-XL) 25 MG 24 hr tablet Take 25 mg by mouth daily., Until Discontinued, Historical Med              Today   CHIEF COMPLAINT:  Patient is doing well this morning. She does have a headache. This is not the worst headache of her life. She denies  photophobia. Her nausea and vomiting have subsided.   VITAL SIGNS:  Blood pressure 126/74, pulse 89, temperature 99.1 F (37.3 C), temperature source Oral, resp. rate 18, height 5\' 4"  (1.626 m), weight 80.196 kg (176 lb 12.8 oz), SpO2 96 %.   REVIEW OF SYSTEMS:  Review of Systems  Constitutional: Negative for fever, chills and malaise/fatigue.  HENT: Negative for congestion and sore throat.   Eyes: Negative for blurred vision.  Respiratory: Negative for cough, hemoptysis, shortness of breath and wheezing.   Cardiovascular: Negative for chest pain, palpitations and leg swelling.  Gastrointestinal: Negative for nausea, vomiting, abdominal pain, diarrhea and blood in stool.  Genitourinary: Negative for dysuria.  Musculoskeletal: Negative for back pain.  Neurological: Positive for headaches. Negative for dizziness, tremors and weakness.  Endo/Heme/Allergies: Does not bruise/bleed easily.     PHYSICAL EXAMINATION:  GENERAL:  74 y.o.-year-old patient lying in the bed with no acute distress.  NECK:  Supple, no jugular venous distention. No thyroid enlargement, no tenderness.  LUNGS: Normal breath sounds bilaterally, no wheezing, rales,rhonchi  No use of accessory muscles of respiration.  CARDIOVASCULAR: S1, S2 normal. No murmurs, rubs, or gallops.  ABDOMEN: Soft, non-tender, non-distended. Bowel sounds present. No organomegaly or mass.  EXTREMITIES: No pedal edema, cyanosis, or clubbing.  PSYCHIATRIC: The patient is alert and oriented x 3.  SKIN: Small insect bite on her left arm.  DATA REVIEW:   CBC  Recent Labs Lab 03/06/15 0534  WBC 10.4  HGB 11.9*  HCT 35.7  PLT 164    Chemistries   Recent Labs Lab 03/06/15 0534  NA 140  K 3.9  CL 109  CO2 24  GLUCOSE 125*  BUN 6  CREATININE 0.67  CALCIUM 8.2*  AST 15  ALT 14  ALKPHOS 66  BILITOT 0.4    Cardiac Enzymes No results for input(s): TROPONINI in the last 168 hours.  Microbiology Results   @[email or web address] @  RADIOLOGY:  Dg Abd 1 View  03/06/2015   CLINICAL DATA:  Abdominal pain.  EXAM: ABDOMEN - 1 VIEW  COMPARISON:  None.  FINDINGS: The bowel gas pattern is normal. Multiple pelvic phleboliths noted. No definite radiopaque calculi visualized. Lumbar spine degenerative changes noted  IMPRESSION: Normal bowel gas pattern.  No acute findings.   Electronically Signed   By: Earle Gell M.D.   On: 03/06/2015 12:25   Ct Angio Chest Pe W/cm &/or Wo Cm  03/05/2015   CLINICAL DATA:  Two week history of chest pain. Ten day history of shortness of breath. History of breast carcinoma  EXAM: CT ANGIOGRAPHY CHEST WITH CONTRAST  TECHNIQUE: Multidetector CT imaging of the chest was performed using the standard protocol during bolus administration of intravenous contrast. Multiplanar CT image reconstructions and MIPs were obtained to evaluate the vascular anatomy.  CONTRAST:  75 mL Omnipaque 350 nonionic  COMPARISON:  Chest CT March 30, 2006; chest radiograph March 04, 2015  FINDINGS: There is a small pulmonary embolus in a branch of the posterior segment of the right upper lobe  pulmonary artery. No more central or larger pulmonary emboli are appreciable on this study. There is no thoracic aortic aneurysm or dissection.  There is airspace consolidation in each lung base with small pleural effusions bilaterally. There is atelectatic change in the posterior segment right upper lobe focally.  Patient is status post left mastectomy.  Thyroid appears normal.  There are small mediastinal lymph nodes but no frank adenopathy by size criteria. There is soft tissue thickening adjacent to the aortic arch, likely post radiation therapy change. Some similar scarring is noted in the aortopulmonary window region, likely due to previous radiation therapy. Pericardium is not appreciably thickened.  There is a small hiatal hernia. In the visualized upper abdomen, there is an apparent 1 x 1 cm cyst in the dome of the liver anteriorly.   There is degenerative change in the thoracic spine. There are no blastic or lytic bone lesions.  Review of the MIP images confirms the above findings.  IMPRESSION: There is a small pulmonary embolus in a branch of the posterior segment of the right upper lobe pulmonary artery. No larger or more central pulmonary emboli are identified. There is no evidence of right heart strain.  There is bibasilar airspace consolidation with small pleural effusions bilaterally.  There is postoperative and post radiation therapy change on the left. No demonstrable adenopathy by size criteria.  Critical Value/emergent results were called by telephone at the time of interpretation on 03/05/2015 at 2:22 pm to Dr. Ulice Bold Alexey Rhoads , who verbally acknowledged these results.   Electronically Signed   By: Lowella Grip III M.D.   On: 03/05/2015 14:22   US Venous Img Lower Bilateral  03/06/2015   CLINICAL DATA:  74 year old female with swelling  EXAM: BILATERAL LOWER EXTREMITY VENOUS DOPPLER ULTRASOUND  TECHNIQUE: Gray-scale sonography with graded compression, as well as color Doppler and duplex ultrasound were performed to evaluate the lower extremity deep venous systems from the level of the common femoral vein and including the common femoral, femoral, profunda femoral, popliteal and calf veins including the posterior tibial, peroneal and gastrocnemius veins when visible. The superficial great saphenous vein was also interrogated. Spectral Doppler was utilized to evaluate flow at rest and with distal augmentation maneuvers in the common femoral, femoral and popliteal veins.  COMPARISON:  None.  FINDINGS: RIGHT LOWER EXTREMITY  Common Femoral Vein: No evidence of thrombus. Normal compressibility, respiratory phasicity and response to augmentation.  Saphenofemoral Junction: No evidence of thrombus. Normal compressibility and flow on color Doppler imaging.  Profunda Femoral Vein: No evidence of thrombus. Normal compressibility and flow on  color Doppler imaging.  Femoral Vein: No evidence of thrombus. Normal compressibility, respiratory phasicity and response to augmentation.  Popliteal Vein: No evidence of thrombus. Normal compressibility, respiratory phasicity and response to augmentation.  Calf Veins: No evidence of thrombus. Normal compressibility and flow on color Doppler imaging.  Superficial Great Saphenous Vein: No evidence of thrombus. Normal compressibility and flow on color Doppler imaging.  Other Findings:  None.  LEFT LOWER EXTREMITY  Common Femoral Vein: No evidence of thrombus. Normal compressibility, respiratory phasicity and response to augmentation.  Saphenofemoral Junction: No evidence of thrombus. Normal compressibility and flow on color Doppler imaging.  Profunda Femoral Vein: No evidence of thrombus. Normal compressibility and flow on color Doppler imaging.  Femoral Vein: No evidence of thrombus. Normal compressibility, respiratory phasicity and response to augmentation.  Popliteal Vein: No evidence of thrombus. Normal compressibility, respiratory phasicity and response to augmentation.  Calf Veins: No evidence of  thrombus. Normal compressibility and flow on color Doppler imaging.  Superficial Great Saphenous Vein: No evidence of thrombus. Normal compressibility and flow on color Doppler imaging.  Other Findings:  None.  IMPRESSION: Sonographic survey of the bilateral lower extremities negative for DVT.  Signed,  Dulcy Fanny. Earleen Newport, DO  [facility]  [facility]   Electronically Signed   By: Corrie Mckusick D.O.   On: 03/06/2015 16:53      Management plans discussed with the patient and she is in agreement. Stable for discharge home  Patient should follow up with PCP in 1 week  CODE STATUS:     Code Status Orders        Start     Ordered   03/04/15 1013  Full code   Continuous     03/04/15 1012      TOTAL TIME TAKING CARE OF THIS PATIENT: 38 minutes.    Redith Drach,  Dashea Mcmullan M.D on 03/07/2015 at 11:11 AM  Between 7am to 6pm - Pager - [phone] After 6pm go to [email or web address] - password [email or web address] [facility]  [facility]  Office  [phone]  CC: Primary care physician; No PCP Per Patient

## 2015-03-08 LAB — ROCKY MTN SPOTTED FVR ABS PNL(IGG+IGM)
RMSF IGG: NEGATIVE
RMSF IgM: 0.44 index (ref 0.00–0.89)

## 2015-03-09 LAB — WOUND CULTURE
CULTURE: NO GROWTH
SPECIAL REQUESTS: NORMAL

## 2015-03-09 LAB — CULTURE, BLOOD (ROUTINE X 2)
Culture: NO GROWTH
Culture: NO GROWTH
Special Requests: NORMAL

## 2015-03-11 LAB — MISC LABCORP TEST (SEND OUT): Labcorp test code: 164722

## 2015-03-13 DIAGNOSIS — Z86711 Personal history of pulmonary embolism: Secondary | ICD-10-CM

## 2015-08-29 ENCOUNTER — Other Ambulatory Visit: Payer: Self-pay | Admitting: Family Medicine

## 2015-08-29 ENCOUNTER — Other Ambulatory Visit: Payer: Self-pay | Admitting: Oncology

## 2015-08-29 DIAGNOSIS — Z1231 Encounter for screening mammogram for malignant neoplasm of breast: Secondary | ICD-10-CM

## 2015-09-04 ENCOUNTER — Ambulatory Visit
Admission: RE | Admit: 2015-09-04 | Discharge: 2015-09-04 | Disposition: A | Discharge status: Home / Self Care | Payer: PPO | Source: Ambulatory Visit | Attending: Family Medicine | Admitting: Family Medicine

## 2015-09-04 DIAGNOSIS — Z1231 Encounter for screening mammogram for malignant neoplasm of breast: Secondary | ICD-10-CM | POA: Diagnosis present

## 2015-09-04 HISTORY — DX: Reserved for concepts with insufficient information to code with codable children: IMO0002

## 2015-09-04 HISTORY — DX: Personal history of antineoplastic chemotherapy: Z92.21

## 2015-10-24 DIAGNOSIS — D485 Neoplasm of uncertain behavior of skin: Secondary | ICD-10-CM | POA: Diagnosis not present

## 2015-10-24 DIAGNOSIS — C44722 Squamous cell carcinoma of skin of right lower limb, including hip: Secondary | ICD-10-CM | POA: Diagnosis not present

## 2015-11-15 ENCOUNTER — Ambulatory Visit
Admission: EM | Admit: 2015-11-15 | Discharge: 2015-11-15 | Disposition: A | Discharge status: Home / Self Care | Payer: PPO | Attending: Family Medicine | Admitting: Family Medicine

## 2015-11-15 ENCOUNTER — Ambulatory Visit (INDEPENDENT_AMBULATORY_CARE_PROVIDER_SITE_OTHER): Payer: PPO

## 2015-11-15 DIAGNOSIS — M79672 Pain in left foot: Secondary | ICD-10-CM

## 2015-11-15 DIAGNOSIS — S91332A Puncture wound without foreign body, left foot, initial encounter: Secondary | ICD-10-CM | POA: Diagnosis not present

## 2015-11-15 DIAGNOSIS — M7989 Other specified soft tissue disorders: Secondary | ICD-10-CM | POA: Diagnosis not present

## 2015-11-15 MED ORDER — TRAMADOL HCL 50 MG PO TABS: 50.0000 mg | ORAL_TABLET | Freq: Three times a day (TID) | ORAL | Status: DC | PRN

## 2015-11-15 MED ORDER — BACITRACIN ZINC 500 UNIT/GM EX OINT: TOPICAL_OINTMENT | Freq: Two times a day (BID) | CUTANEOUS | Status: DC

## 2015-11-15 MED ORDER — TETANUS-DIPHTH-ACELL PERTUSSIS 5-2.5-18.5 LF-MCG/0.5 IM SUSP
0.5000 mL | Freq: Once | INTRAMUSCULAR | Status: AC
  Administered 2015-11-15: 0.5 mL via INTRAMUSCULAR

## 2015-11-15 MED ORDER — CIPROFLOXACIN HCL 500 MG PO TABS: 500.0000 mg | ORAL_TABLET | Freq: Two times a day (BID) | ORAL | Status: DC

## 2015-11-15 NOTE — Discharge Instructions (Signed)
Take medication as prescribed. Rest. Drink plenty of fluids. Keep clean. Apply topical antibiotic as discussed. Elevate.   Follow up with your primary care physician on Monday.   Return to Urgent care or ER for increased pain, swelling, redness, drainage, fever, new or worsening concerns.    Puncture Wound A puncture wound is an injury that is caused by a sharp, thin object that goes through (penetrates) your skin. Usually, a puncture wound does not leave a large opening in your skin, so it may not bleed a lot. However, when you get a puncture wound, dirt or other materials (foreign bodies) can be forced into your wound and break off inside. This increases the chance of infection, such as tetanus. CAUSES Puncture wounds are caused by any sharp, thin object that goes through your skin, such as:  Animal teeth, as with an animal bite.  Sharp, pointed objects, such as nails, splinters of glass, fishhooks, and needles. SYMPTOMS Symptoms of a puncture wound include:  Pain.  Bleeding.  Swelling.  Bruising.  Fluid leaking from the wound.  Numbness, tingling, or loss of function. DIAGNOSIS This condition is diagnosed with a medical history and physical exam. Your wound will be checked to see if it contains any foreign bodies. You may also have X-rays or other imaging tests. TREATMENT Treatment for a puncture wound depends on how serious the wound is. It also depends on whether the wound contains any foreign bodies. Treatment for all types of puncture wounds usually starts with:  Controlling the bleeding.  Washing out the wound with a germ-free (sterile) salt-water solution.  Checking the wound for foreign bodies. Treatment may also include:  Having the wound opened surgically to remove a foreign object.  Closing the wound with stitches (sutures) if it continues to bleed.  Covering the wound with antibiotic ointments and a bandage (dressing).  Receiving a tetanus  shot.  Receiving a rabies vaccine. HOME CARE INSTRUCTIONS Medicines  Take or apply over-the-counter and prescription medicines only as told by your health care provider.  If you were prescribed an antibiotic, take or apply it as told by your health care provider. Do not stop using the antibiotic even if your condition improves. Wound Care  There are many ways to close and cover a wound. For example, a wound can be covered with sutures, skin glue, or adhesive strips. Follow instructions from your health care provider about:  How to take care of your wound.  When and how you should change your dressing.  When you should remove your dressing.  Removing whatever was used to close your wound.  Keep the dressing dry as told by your health care provider. Do not take baths, swim, use a hot tub, or do anything that would put your wound underwater until your health care provider approves.  Clean the wound as told by your health care provider.  Do not scratch or pick at the wound.  Check your wound every day for signs of infection. Watch for:  Redness, swelling, or pain.  Fluid, blood, or pus. General Instructions  Raise (elevate) the injured area above the level of your heart while you are sitting or lying down.  If your puncture wound is in your foot, ask your health care provider if you need to avoid putting weight on your foot and for how long.  Keep all follow-up visits as told by your health care provider. This is important. SEEK MEDICAL CARE IF:  You received a tetanus shot and you  have swelling, severe pain, redness, or bleeding at the injection site.  You have a fever.  Your sutures come out.  You notice a bad smell coming from your wound or your dressing.  You notice something coming out of your wound, such as wood or glass.  Your pain is not controlled with medicine.  You have increased redness, swelling, or pain at the site of your wound.  You have fluid,  blood, or pus coming from your wound.  You notice a change in the color of your skin near your wound.  You need to change the dressing frequently due to fluid, blood, or pus draining from your wound.  You develop a new rash.  You develop numbness around your wound. SEEK IMMEDIATE MEDICAL CARE IF:  You develop severe swelling around your wound.  Your pain suddenly increases and is severe.  You develop painful skin lumps.  You have a red streak going away from your wound.  The wound is on your hand or foot and you cannot properly move a finger or toe.  The wound is on your hand or foot and you notice that your fingers or toes look pale or bluish.   This information is not intended to replace advice given to you by your health care provider. Make sure you discuss any questions you have with your health care provider.   Document Released: 06/17/2005 Document Revised: 05/29/2015 Document Reviewed: 10/31/2014 Elsevier Interactive Patient Education [employer].

## 2015-11-15 NOTE — ED Notes (Addendum)
Patient states that she stepped on a rusty nail last night with her left foot.  Patient is unsure as to when her last Tetanus Shot was.  She states that her foot is swollen and has pain while walking as well.  Patient also took 1 tab of Augmentin but she is unsure of the dosage.
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
## 2015-11-21 DIAGNOSIS — C44722 Squamous cell carcinoma of skin of right lower limb, including hip: Secondary | ICD-10-CM | POA: Diagnosis not present

## 2015-11-22 DIAGNOSIS — S91332D Puncture wound without foreign body, left foot, subsequent encounter: Secondary | ICD-10-CM | POA: Diagnosis not present

## 2015-12-11 DIAGNOSIS — S81801A Unspecified open wound, right lower leg, initial encounter: Secondary | ICD-10-CM | POA: Diagnosis not present

## 2015-12-11 DIAGNOSIS — Z08 Encounter for follow-up examination after completed treatment for malignant neoplasm: Secondary | ICD-10-CM | POA: Diagnosis not present

## 2015-12-11 DIAGNOSIS — D3612 Benign neoplasm of peripheral nerves and autonomic nervous system, upper limb, including shoulder: Secondary | ICD-10-CM | POA: Diagnosis not present

## 2015-12-11 DIAGNOSIS — Z85828 Personal history of other malignant neoplasm of skin: Secondary | ICD-10-CM | POA: Diagnosis not present

## 2015-12-11 DIAGNOSIS — Z1283 Encounter for screening for malignant neoplasm of skin: Secondary | ICD-10-CM | POA: Diagnosis not present

## 2015-12-11 DIAGNOSIS — D485 Neoplasm of uncertain behavior of skin: Secondary | ICD-10-CM | POA: Diagnosis not present

## 2016-01-28 DIAGNOSIS — H2513 Age-related nuclear cataract, bilateral: Secondary | ICD-10-CM | POA: Diagnosis not present

## 2016-01-31 DIAGNOSIS — E039 Hypothyroidism, unspecified: Secondary | ICD-10-CM | POA: Diagnosis not present

## 2016-01-31 DIAGNOSIS — R5383 Other fatigue: Secondary | ICD-10-CM | POA: Diagnosis not present

## 2016-02-04 ENCOUNTER — Other Ambulatory Visit: Payer: Self-pay | Admitting: *Deleted

## 2016-02-04 DIAGNOSIS — C50919 Malignant neoplasm of unspecified site of unspecified female breast: Secondary | ICD-10-CM

## 2016-02-06 ENCOUNTER — Encounter: Payer: Self-pay | Admitting: Oncology

## 2016-02-06 ENCOUNTER — Inpatient Hospital Stay (HOSPITAL_BASED_OUTPATIENT_CLINIC_OR_DEPARTMENT_OTHER): Payer: PPO | Admitting: Oncology

## 2016-02-06 ENCOUNTER — Inpatient Hospital Stay: Payer: PPO | Attending: Oncology

## 2016-02-06 VITALS — BP 116/82 | HR 69 | Temp 97.7°F | Wt 168.9 lb

## 2016-02-06 DIAGNOSIS — C50919 Malignant neoplasm of unspecified site of unspecified female breast: Secondary | ICD-10-CM

## 2016-02-06 DIAGNOSIS — I10 Essential (primary) hypertension: Secondary | ICD-10-CM

## 2016-02-06 DIAGNOSIS — R000 Tachycardia, unspecified: Secondary | ICD-10-CM

## 2016-02-06 DIAGNOSIS — Z9221 Personal history of antineoplastic chemotherapy: Secondary | ICD-10-CM | POA: Insufficient documentation

## 2016-02-06 DIAGNOSIS — Z7901 Long term (current) use of anticoagulants: Secondary | ICD-10-CM

## 2016-02-06 DIAGNOSIS — Z79899 Other long term (current) drug therapy: Secondary | ICD-10-CM | POA: Insufficient documentation

## 2016-02-06 DIAGNOSIS — N644 Mastodynia: Secondary | ICD-10-CM

## 2016-02-06 DIAGNOSIS — F419 Anxiety disorder, unspecified: Secondary | ICD-10-CM

## 2016-02-06 DIAGNOSIS — R079 Chest pain, unspecified: Secondary | ICD-10-CM | POA: Insufficient documentation

## 2016-02-06 DIAGNOSIS — E079 Disorder of thyroid, unspecified: Secondary | ICD-10-CM | POA: Diagnosis not present

## 2016-02-06 DIAGNOSIS — Z853 Personal history of malignant neoplasm of breast: Secondary | ICD-10-CM | POA: Diagnosis not present

## 2016-02-06 DIAGNOSIS — Z923 Personal history of irradiation: Secondary | ICD-10-CM | POA: Insufficient documentation

## 2016-02-06 DIAGNOSIS — R0781 Pleurodynia: Secondary | ICD-10-CM

## 2016-02-06 LAB — CBC WITH DIFFERENTIAL/PLATELET
Basophils Absolute: 0.1 10*3/uL (ref 0–0.1)
Basophils Relative: 1 %
EOS PCT: 3 %
Eosinophils Absolute: 0.2 10*3/uL (ref 0–0.7)
HEMATOCRIT: 40.2 % (ref 35.0–47.0)
Hemoglobin: 13.9 g/dL (ref 12.0–16.0)
LYMPHS ABS: 1.9 10*3/uL (ref 1.0–3.6)
LYMPHS PCT: 28 %
MCH: 28.8 pg (ref 26.0–34.0)
MCHC: 34.7 g/dL (ref 32.0–36.0)
MCV: 83.2 fL (ref 80.0–100.0)
MONO ABS: 0.6 10*3/uL (ref 0.2–0.9)
Monocytes Relative: 9 %
Neutro Abs: 4 10*3/uL (ref 1.4–6.5)
Neutrophils Relative %: 59 %
PLATELETS: 186 10*3/uL (ref 150–440)
RBC: 4.83 MIL/uL (ref 3.80–5.20)
RDW: 14.5 % (ref 11.5–14.5)
WBC: 6.7 10*3/uL (ref 3.6–11.0)

## 2016-02-06 LAB — COMPREHENSIVE METABOLIC PANEL
ALT: 14 U/L (ref 14–54)
AST: 21 U/L (ref 15–41)
Albumin: 4.2 g/dL (ref 3.5–5.0)
Alkaline Phosphatase: 65 U/L (ref 38–126)
Anion gap: 5 (ref 5–15)
BILIRUBIN TOTAL: 0.7 mg/dL (ref 0.3–1.2)
BUN: 20 mg/dL (ref 6–20)
CHLORIDE: 105 mmol/L (ref 101–111)
CO2: 26 mmol/L (ref 22–32)
CREATININE: 0.76 mg/dL (ref 0.44–1.00)
Calcium: 9.3 mg/dL (ref 8.9–10.3)
Glucose, Bld: 100 mg/dL — ABNORMAL HIGH (ref 65–99)
Potassium: 3.6 mmol/L (ref 3.5–5.1)
Sodium: 136 mmol/L (ref 135–145)
TOTAL PROTEIN: 7.1 g/dL (ref 6.5–8.1)

## 2016-02-06 MED ORDER — DOXYCYCLINE HYCLATE 100 MG PO TABS: 100.0000 mg | ORAL_TABLET | Freq: Two times a day (BID) | ORAL | Status: DC

## 2016-02-06 NOTE — Progress Notes (Signed)
[place] @ [facility] Telephone:(336) [phone]  Fax:(336) [place]: [birth year]  MR#: [id number]  GUR#:[id number]  Patient Care Team: No Pcp Per Patient as PCP - General (General Practice)  CHIEF COMPLAINT:  Chief Complaint  Patient presents with  . Breast Cancer     No history exists.  Subjective: Chief Complaint/Diagnosis:   Chief Complaint/Problem List  1.  T2N0M0 breast cancer, stage II.  ER/PR positive. HER-2/neu negative.  2.  AC times four.      3.  Radiation.  4.  Tamoxifen, patient discontinued.  5.  Arimidex, patient discontinued.   6.  Femara, patient discontinued. 7.patient had bilateral  salpingo-oophorectomy for ovarian reported  To be benign in [facility] in 2015  INTERVAL HISTORY: The 75 year old lady with a previous history of carcinoma of breast came today further follow-up because of right breast pain and right-sided chest pain.  Since rib cage pain C has pain more ONC consult wound physical activity.  Started 6 weeks ago.  Had a normal mammogram in December.  Patient is extremely concerned about his pain because according to her left breast cancer started with the pain like this.. No chills.  No fever.  REVIEW OF SYSTEMS:  GENERAL: Very anxious and apprehensive lady not in any acute distress.  Had a previous history of breast cancer.  Now has developed pain in the right breast. PERFORMANCE STATUS (ECOG): 0 HEENT:  No visual changes, runny nose, sore throat, mouth sores or tenderness. Lungs: No shortness of breath or cough.  No hemoptysis. Cardiac:  No chest pain, palpitations, orthopnea, or PND. GI:  No nausea, vomiting, diarrhea, constipation, melena or hematochezia. GU:  No urgency, frequency, dysuria, or hematuria.  Musculoskeletal:  No back pain.  No joint pain.  No muscle tenderness. Extremities:  No pain or swelling. Skin:  No rashes or skin changes. Neuro:  No headache, numbness or weakness, balance or coordination  issues. Endocrine:  No diabetes, thyroid issues, hot flashes or night sweats. Psych:  No mood changes, depression or anxiety. Pain:  No focal pain. Review of systems:  All other systems reviewed and found to be negative. As per HPI. Otherwise, a complete review of systems is negatve.  PAST MEDICAL HISTORY: Past Medical History  Diagnosis Date  . Thyroid disease   . Tick fever   . Brain infection   . Tachycardia   . Breast cancer ([place]) 2001    Left side mastectomy  . Radiation 2001    BREAST CA  . S/P chemotherapy, time since greater than 12 weeks 2001    BREAST CA  . BP (high blood pressure) 08/21/2014    PAST SURGICAL HISTORY: Past Surgical History  Procedure Laterality Date  . Abdominal hysterectomy    . Breast biopsy Right 2002    EXCISIONAL - NEG  . Mastectomy Left 2001    BREAST CA  . Thyroidectomy    . Mass removed from bladder and both ovaries    . Hernia repair      FAMILY HISTORY Family History  Problem Relation Age of Onset  . Breast cancer Mother     ADVANCED DIRECTIVES:  No flowsheet data found.  HEALTH MAINTENANCE: Social History  Substance Use Topics  . Smoking status: Never Smoker   . Smokeless tobacco: None  . Alcohol Use: No      Allergies  Allergen Reactions  . Pyridoxine Anaphylaxis  . Hydrocodone Nausea Only and Nausea And Vomiting  . Bee  Venom Other (See Comments)  . Iodinated Diagnostic Agents Other (See Comments)    Other Reaction: told to list  . Motrin [Ibuprofen] Other (See Comments)    depression  . Oxycodone Nausea Only  . Latex Rash  . Nickel Rash    Current Outpatient Prescriptions  Medication Sig Dispense Refill  . apixaban (ELIQUIS) 5 MG TABS tablet Take 1 tablet (5 mg total) by mouth 2 (two) times daily. 60 tablet 0  . hydrochlorothiazide (HYDRODIURIL) 25 MG tablet Take 25 mg by mouth daily as needed (swelling).    Marland Kitchen levothyroxine (SYNTHROID, LEVOTHROID) 50 MCG tablet Take 50 mcg by mouth every morning. (brand  name only)    . metoprolol succinate (TOPROL-XL) 25 MG 24 hr tablet Take 25 mg by mouth daily.    Marland Kitchen apixaban (ELIQUIS) 5 MG TABS tablet Take 2 tablets (10 mg total) by mouth 2 (two) times daily. 12 tablet 0   No current facility-administered medications for this visit.    OBJECTIVE:  Filed Vitals:   02/06/16 1338  BP: 116/82  Pulse: 69  Temp: 97.7 F (36.5 C)     Body mass index is 28.97 kg/(m^2).    ECOG FS:0 - Asymptomatic  PHYSICAL EXAM: GENERAL:  Well developed, well nourished, sitting comfortably in the exam room in no acute distress. MENTAL STATUS:  Alert and oriented to person, place and time.  ENT:  Oropharynx clear without lesion.  Tongue normal. Mucous membranes moist.  RESPIRATORY:  Clear to auscultation without rales, wheezes or rhonchi. Patient had a previous history of small pulmonary emboli in June 2016 being managed by primary care physician CARDIOVASCULAR:  Regular rate and rhythm without murmur, rub or gallop. BREAST:  Right breast without masses, skin changes or nipple discharge.  Left breast without masses, skin changes or nipple discharge.  It is slight tenderness in the right breast area but no redness. ABDOMEN:  Soft, non-tender, with active bowel sounds, and no hepatosplenomegaly.  No masses. BACK:  No CVA tenderness.  No tenderness on percussion of the back or rib cage. SKIN:  No rashes, ulcers or lesions. EXTREMITIES: No edema, no skin discoloration or tenderness.  No palpable cords. LYMPH NODES: No palpable cervical, supraclavicular, axillary or inguinal adenopathy  NEUROLOGICAL: Unremarkable. PSYCH:  Appropriate.   LAB RESULTS:  CBC Latest Ref Rng 02/06/2016 03/06/2015  WBC 3.6 - 11.0 K/uL 6.7 10.4  Hemoglobin 12.0 - 16.0 g/dL 13.9 11.9(L)  Hematocrit 35.0 - 47.0 % 40.2 35.7  Platelets 150 - 440 K/uL 186 164    Appointment on 02/06/2016  Component Date Value Ref Range Status  . WBC 02/06/2016 6.7  3.6 - 11.0 K/uL Final  . RBC 02/06/2016 4.83   3.80 - 5.20 MIL/uL Final  . Hemoglobin 02/06/2016 13.9  12.0 - 16.0 g/dL Final  . HCT 02/06/2016 40.2  35.0 - 47.0 % Final  . MCV 02/06/2016 83.2  80.0 - 100.0 fL Final  . MCH 02/06/2016 28.8  26.0 - 34.0 pg Final  . MCHC 02/06/2016 34.7  32.0 - 36.0 g/dL Final  . RDW 02/06/2016 14.5  11.5 - 14.5 % Final  . Platelets 02/06/2016 186  150 - 440 K/uL Final  . Neutrophils Relative % 02/06/2016 59   Final  . Neutro Abs 02/06/2016 4.0  1.4 - 6.5 K/uL Final  . Lymphocytes Relative 02/06/2016 28   Final  . Lymphs Abs 02/06/2016 1.9  1.0 - 3.6 K/uL Final  . Monocytes Relative 02/06/2016 9   Final  . Monocytes  Absolute 02/06/2016 0.6  0.2 - 0.9 K/uL Final  . Eosinophils Relative 02/06/2016 3   Final  . Eosinophils Absolute 02/06/2016 0.2  0 - 0.7 K/uL Final  . Basophils Relative 02/06/2016 1   Final  . Basophils Absolute 02/06/2016 0.1  0 - 0.1 K/uL Final  . Sodium 02/06/2016 136  135 - 145 mmol/L Final  . Potassium 02/06/2016 3.6  3.5 - 5.1 mmol/L Final  . Chloride 02/06/2016 105  101 - 111 mmol/L Final  . CO2 02/06/2016 26  22 - 32 mmol/L Final  . Glucose, Bld 02/06/2016 100* 65 - 99 mg/dL Final  . BUN 02/06/2016 20  6 - 20 mg/dL Final  . Creatinine, Ser 02/06/2016 0.76  0.44 - 1.00 mg/dL Final  . Calcium 02/06/2016 9.3  8.9 - 10.3 mg/dL Final  . Total Protein 02/06/2016 7.1  6.5 - 8.1 g/dL Final  . Albumin 02/06/2016 4.2  3.5 - 5.0 g/dL Final  . AST 02/06/2016 21  15 - 41 U/L Final  . ALT 02/06/2016 14  14 - 54 U/L Final  . Alkaline Phosphatase 02/06/2016 65  38 - 126 U/L Final  . Total Bilirubin 02/06/2016 0.7  0.3 - 1.2 mg/dL Final  . GFR calc non Af Amer 02/06/2016 >60  >60 mL/min Final  . GFR calc Af Amer 02/06/2016 >60  >60 mL/min Final   Comment: (NOTE) The eGFR has been calculated using the CKD EPI equation. This calculation has not been validated in all clinical situations. eGFR's persistently <60 mL/min signify possible Chronic Kidney Disease.   . Anion gap 02/06/2016 5  5 -  15 Final    STUDIES: No results found.  ASSESSMENT: Previous history of carcinoma of left breast. Now presented with a right breast pain.  of 4 - 6 weeks duration Had a normal mammogram in December 2016  MEDICAL DECISION MAKING:  Exact etiology of this pain on the right breast is not clear.  . very well could be due to mastitis Doxycycline has been started 100 mg twice a day for 7 days the pain is not better MRI scan of breast as patient is extremely concerned that last time in the late left breast cancer was not showing up on mammogram. Order a CT scan of the chest to evaluate for any lung lesion can be considered.  Patient is advised to call me if there is no relief in the pain  Patient expressed understanding and was in agreement with this plan. She also understands that She can call clinic at any time with any questions, concerns, or complaints.    No matching staging information was found for the patient.  Forest Gleason, MD   02/06/2016 1:59 PM

## 2016-02-09 ENCOUNTER — Encounter: Payer: Self-pay | Admitting: Oncology

## 2016-02-25 ENCOUNTER — Telehealth: Payer: Self-pay | Admitting: *Deleted

## 2016-02-25 DIAGNOSIS — C50919 Malignant neoplasm of unspecified site of unspecified female breast: Secondary | ICD-10-CM

## 2016-02-25 DIAGNOSIS — R0789 Other chest pain: Secondary | ICD-10-CM

## 2016-02-25 NOTE — Telephone Encounter (Signed)
Per MD last note, if pain persists then can order CT scan or MRI. Does her pain persist after taking antibiotics?

## 2016-02-25 NOTE — Telephone Encounter (Signed)
Patient states she saw MD a few weeks ago and was placed on antibiotics. She states he also was going to order a test for her.  She would like to know what his plan is at this time.

## 2016-02-25 NOTE — Telephone Encounter (Signed)
Called patient and she states she still is having a lot of soreness which she feels indicates pain.  Would like to proceed with further testing.  Informed her once this is arranged a scheduler will call her with appt time/dates.

## 2016-02-25 NOTE — Telephone Encounter (Signed)
Per Dr. Oliva Bustard, order CT scan of chest with contrast and to follow up with [facility] for results. Pt will be made aware of appt from scheduling.

## 2016-03-03 ENCOUNTER — Ambulatory Visit
Admission: RE | Admit: 2016-03-03 | Discharge: 2016-03-03 | Disposition: A | Discharge status: Home / Self Care | Payer: PPO | Source: Ambulatory Visit | Attending: Oncology | Admitting: Oncology

## 2016-03-03 DIAGNOSIS — C50919 Malignant neoplasm of unspecified site of unspecified female breast: Secondary | ICD-10-CM

## 2016-03-03 DIAGNOSIS — R0789 Other chest pain: Secondary | ICD-10-CM | POA: Diagnosis not present

## 2016-03-03 MED ORDER — IOPAMIDOL (ISOVUE-300) INJECTION 61%
75.0000 mL | Freq: Once | INTRAVENOUS | Status: AC | PRN
  Administered 2016-03-03: 75 mL via INTRAVENOUS

## 2016-03-05 ENCOUNTER — Encounter: Payer: Self-pay | Admitting: *Deleted

## 2016-03-05 ENCOUNTER — Inpatient Hospital Stay: Payer: PPO | Attending: Hematology and Oncology | Admitting: Hematology and Oncology

## 2016-03-05 ENCOUNTER — Other Ambulatory Visit: Payer: Self-pay | Admitting: Hematology and Oncology

## 2016-03-05 VITALS — BP 104/67 | HR 71 | Temp 96.7°F | Resp 17 | Ht 64.0 in | Wt 167.1 lb

## 2016-03-05 DIAGNOSIS — Z9221 Personal history of antineoplastic chemotherapy: Secondary | ICD-10-CM | POA: Diagnosis not present

## 2016-03-05 DIAGNOSIS — C50912 Malignant neoplasm of unspecified site of left female breast: Secondary | ICD-10-CM

## 2016-03-05 DIAGNOSIS — E079 Disorder of thyroid, unspecified: Secondary | ICD-10-CM

## 2016-03-05 DIAGNOSIS — Z923 Personal history of irradiation: Secondary | ICD-10-CM | POA: Diagnosis not present

## 2016-03-05 DIAGNOSIS — Z853 Personal history of malignant neoplasm of breast: Secondary | ICD-10-CM | POA: Diagnosis not present

## 2016-03-05 DIAGNOSIS — Z7901 Long term (current) use of anticoagulants: Secondary | ICD-10-CM | POA: Diagnosis not present

## 2016-03-05 DIAGNOSIS — N644 Mastodynia: Secondary | ICD-10-CM | POA: Diagnosis not present

## 2016-03-05 DIAGNOSIS — I10 Essential (primary) hypertension: Secondary | ICD-10-CM | POA: Diagnosis not present

## 2016-03-05 DIAGNOSIS — Z9012 Acquired absence of left breast and nipple: Secondary | ICD-10-CM | POA: Diagnosis not present

## 2016-03-05 DIAGNOSIS — K5909 Other constipation: Secondary | ICD-10-CM | POA: Diagnosis not present

## 2016-03-05 DIAGNOSIS — Z79899 Other long term (current) drug therapy: Secondary | ICD-10-CM

## 2016-03-05 DIAGNOSIS — Z8661 Personal history of infections of the central nervous system: Secondary | ICD-10-CM | POA: Diagnosis not present

## 2016-03-05 DIAGNOSIS — R000 Tachycardia, unspecified: Secondary | ICD-10-CM

## 2016-03-05 NOTE — Progress Notes (Signed)
[facility] day:  03/05/2016  Chief Complaint: Debra Patel is a 75 y.o. female with stage IIA multi-focal left breast cancer for reassessment.  HPI:  The patient presented with a 3 year history of left breast pain.  Left breast stereotactic biopsy on 05/06/2000 revealed grade I infiltrating ductal carcinoma.    She underwent left mastectomy on 05/13/2000 by Dr. Rochel Brome.  Pathology revealed multi-focal disease.  There was a 3.0 x 2.0 x 1.3 cm infiltrating ductal carcinoma in the left lower quadrant and a 3.0 x 1.5 x 1.5 cm infiltrating ductal carcinoma.  Lesions were 2 cm apart.  Nine lymph nodes were negative.  Pathology revealed a T2N0M0 (stage IIA) breast cancer.  Tumor was ER/PR positive and Her2/neu negative (no pathology report available).  She received 4 cycles of Adriamycin and Cytoxan followed by radiation.  She was then on tamoxifen, Arimidex, and Femara.  She was on hormonal therapy for approximately 4-6 weeks.  The patient discontinued hormonal therapy as it was "messing with me".  She underwent bilateral salpingo-oophorectomy on 11/27/2013 at [facility].  The patient was last seen in the medical oncology clinic on 02/06/2016 by Dr. Oliva Bustard.  At that time, she had right breast pain.The etiology was unclear.  She was prescribed doxycycline 100 mg BID x 7 days for presumed mastitis.  Breast MRI was considered.  A chest CT was ordered to evaluate for any lung lesion.  Chest CT on on 03/03/2016 revealed no acute process in the chest.  Since last visit, she states states that she stepped on 2 nails. She went to urgent care. She was prescribed antibiotics. The soreness has improved in her foot.   Symptomatically, she notes right breast soreness. Pain is intermittent.  She denies any bone pain.   Past Medical History  Diagnosis Date  . Thyroid disease   . Tick fever   . Brain infection   . Tachycardia   . Radiation  2001    BREAST CA  . S/P chemotherapy, time since greater than 12 weeks 2001    BREAST CA  . BP (high blood pressure) 08/21/2014  . Breast cancer ([place]) 2001    Left side mastectomy    Past Surgical History  Procedure Laterality Date  . Abdominal hysterectomy    . Breast biopsy Right 2002    EXCISIONAL - NEG  . Mastectomy Left 2001    BREAST CA  . Thyroidectomy    . Mass removed from bladder and both ovaries    . Hernia repair      Family History  Problem Relation Age of Onset  . Breast cancer Mother     Social History:  reports that she has never smoked. She does not have any smokeless tobacco history on file. She reports that she does not drink alcohol. Her drug history is not on file.  She has a Media planner.  She has 5 acres.  The patient is alone today.  Allergies:  Allergies  Allergen Reactions  . Pyridoxine Anaphylaxis  . Hydrocodone Nausea Only and Nausea And Vomiting  . Bee Venom Other (See Comments)  . Motrin [Ibuprofen] Other (See Comments)    depression  . Oxycodone Nausea Only  . Shrimp [Shellfish Allergy] Hives  . Latex Rash  . Nickel Rash    Current Medications: Current Outpatient Prescriptions  Medication Sig Dispense Refill  . apixaban (ELIQUIS) 5 MG TABS tablet Take 1 tablet (5 mg total)  by mouth 2 (two) times daily. (Patient taking differently: Take 5 mg by mouth daily. ) 60 tablet 0  . hydrochlorothiazide (HYDRODIURIL) 25 MG tablet Take 25 mg by mouth daily as needed (swelling).    Marland Kitchen levothyroxine (SYNTHROID, LEVOTHROID) 50 MCG tablet Take 50 mcg by mouth every morning. (brand name only)    . metoprolol succinate (TOPROL-XL) 25 MG 24 hr tablet Take 25 mg by mouth daily.    Marland Kitchen omeprazole (PRILOSEC) 20 MG capsule Take by mouth.    Marland Kitchen apixaban (ELIQUIS) 5 MG TABS tablet Take 2 tablets (10 mg total) by mouth 2 (two) times daily. 12 tablet 0   No current facility-administered medications for this visit.    Review of Systems:  GENERAL:  Feels good.   No fevers, sweats or weight loss. PERFORMANCE STATUS (ECOG):  0 HEENT:  No visual changes, runny nose, sore throat, mouth sores or tenderness. Lungs: No shortness of breath or cough.  No hemoptysis. Cardiac:  No chest pain, palpitations, orthopnea, or PND. GI:  No nausea, vomiting, diarrhea, constipation, melena or hematochezia. GU:  No urgency, frequency, dysuria, or hematuria. Musculoskeletal:  No back pain.  No joint pain.  No muscle tenderness. Extremities:  No pain or swelling. Skin:  No rashes or skin changes. Neuro:  No headache, numbness or weakness, balance or coordination issues. Endocrine:  No diabetes.  Thyroid issues on Synthroid.  No hot flashes or night sweats. Psych:  No mood changes, depression or anxiety. Pain:  No focal pain. Review of systems:  All other systems reviewed and found to be negative.  Physical Exam: Blood pressure 104/67, pulse 71, temperature 96.7 F (35.9 C), temperature source Tympanic, resp. rate 17, height 5' 4"  (1.626 m), weight 167 lb 1.7 oz (75.8 kg). GENERAL:  Well developed, well nourished, sitting comfortably in the exam room in no acute distress. MENTAL STATUS:  Alert and oriented to person, place and time. HEAD:  Short styled dark hair with blonde highlights.  Normocephalic, atraumatic, face symmetric, no Cushingoid features. EYES:  Blue eyes.  Pupils equal round and reactive to light and accomodation.  No conjunctivitis or scleral icterus. ENT:  Oropharynx clear without lesion.  Tongue normal. Mucous membranes moist.  RESPIRATORY:  Clear to auscultation without rales, wheezes or rhonchi. CARDIOVASCULAR:  Regular rate and rhythm without murmur, rub or gallop. BREAST:  Right breast with fibrocystic changes.  No masses, skin changes or nipple discharge.  Left mastectomy.  No erythema or nodularity.  ABDOMEN:  Soft, non-tender, with active bowel sounds, and no hepatosplenomegaly.  No masses. SKIN:  No rashes, ulcers or lesions. EXTREMITIES:   Mild chronic lower extremity edema.  No skin discoloration or tenderness.  No palpable cords. LYMPH NODES: No palpable cervical, supraclavicular, axillary or inguinal adenopathy  NEUROLOGICAL: Unremarkable. PSYCH:  Appropriate.  No visits with results within 3 Day(s) from this visit. Latest known visit with results is:  Appointment on 02/06/2016  Component Date Value Ref Range Status  . WBC 02/06/2016 6.7  3.6 - 11.0 K/uL Final  . RBC 02/06/2016 4.83  3.80 - 5.20 MIL/uL Final  . Hemoglobin 02/06/2016 13.9  12.0 - 16.0 g/dL Final  . HCT 02/06/2016 40.2  35.0 - 47.0 % Final  . MCV 02/06/2016 83.2  80.0 - 100.0 fL Final  . MCH 02/06/2016 28.8  26.0 - 34.0 pg Final  . MCHC 02/06/2016 34.7  32.0 - 36.0 g/dL Final  . RDW 02/06/2016 14.5  11.5 - 14.5 % Final  . Platelets 02/06/2016  186  150 - 440 K/uL Final  . Neutrophils Relative % 02/06/2016 59   Final  . Neutro Abs 02/06/2016 4.0  1.4 - 6.5 K/uL Final  . Lymphocytes Relative 02/06/2016 28   Final  . Lymphs Abs 02/06/2016 1.9  1.0 - 3.6 K/uL Final  . Monocytes Relative 02/06/2016 9   Final  . Monocytes Absolute 02/06/2016 0.6  0.2 - 0.9 K/uL Final  . Eosinophils Relative 02/06/2016 3   Final  . Eosinophils Absolute 02/06/2016 0.2  0 - 0.7 K/uL Final  . Basophils Relative 02/06/2016 1   Final  . Basophils Absolute 02/06/2016 0.1  0 - 0.1 K/uL Final  . Sodium 02/06/2016 136  135 - 145 mmol/L Final  . Potassium 02/06/2016 3.6  3.5 - 5.1 mmol/L Final  . Chloride 02/06/2016 105  101 - 111 mmol/L Final  . CO2 02/06/2016 26  22 - 32 mmol/L Final  . Glucose, Bld 02/06/2016 100* 65 - 99 mg/dL Final  . BUN 02/06/2016 20  6 - 20 mg/dL Final  . Creatinine, Ser 02/06/2016 0.76  0.44 - 1.00 mg/dL Final  . Calcium 02/06/2016 9.3  8.9 - 10.3 mg/dL Final  . Total Protein 02/06/2016 7.1  6.5 - 8.1 g/dL Final  . Albumin 02/06/2016 4.2  3.5 - 5.0 g/dL Final  . AST 02/06/2016 21  15 - 41 U/L Final  . ALT 02/06/2016 14  14 - 54 U/L Final  . Alkaline  Phosphatase 02/06/2016 65  38 - 126 U/L Final  . Total Bilirubin 02/06/2016 0.7  0.3 - 1.2 mg/dL Final  . GFR calc non Af Amer 02/06/2016 >60  >60 mL/min Final  . GFR calc Af Amer 02/06/2016 >60  >60 mL/min Final   Comment: (NOTE) The eGFR has been calculated using the CKD EPI equation. This calculation has not been validated in all clinical situations. eGFR's persistently <60 mL/min signify possible Chronic Kidney Disease.   . Anion gap 02/06/2016 5  5 - 15 Final    Assessment:  Debra Patel is a 75 y.o. female with stage IIA left breast cancer s/p mastectomy on 05/13/2000.  Pathology revealed multi-focal disease.  There was a 3.0 x 2.0 x 1.3 cm grade I infiltrating ductal carcinoma and a 3.0 x 1.5 x 1.5 cm grade III infiltrating ductal carcinoma.  Lesions were 2 cm apart.  Nine lymph nodes were negative.  Pathology revealed a T2N0M0 (stage IIA) breast cancer.  Tumor was ER/PR positive and Her2/neu negative.  She received 4 cycles of Adriamycin and Cytoxan followed by radiation.  She was on tamoxifen, Arimidex, and Femara for about 4-6 weeks.  She discontinued hormonal therapy secondary to side effects.  She underwent bilateral salpingo-oophorectomy on 11/27/2013 at [facility].  Right sided mammogram on 09/04/2015 revealed no evidence of malignancy.  Chest CT on on 03/03/2016 revealed no acute process in the chest.  Symptomatically, she notes intermittent right breast soreness. She denies any bone pain.  Exam is benign.  Plan: 1.  Review entire medical history, diagnosis and management of breast cancer.  Discuss her decision to not pursue hormonal therapy. 2.  Review chest CT. 3.  Schedule breast MRI. 4.  Review labs from 02/06/2016 (normal CBC with diff, CMP). 5.  RTC after breast MRI.   Lequita Asal, MD  03/05/2016, 1:54 PM

## 2016-03-05 NOTE — Progress Notes (Signed)
No changes since last visit.  Pt reports she saw Dr. Jeb Levering about two weeks ago in right breast for soreness.  CT scan last Monday per pt.  Pt complains of little trouble swallowing much like when having issues before with the thyroid.  Constipation and takes stool softners and ducolax

## 2016-03-16 ENCOUNTER — Other Ambulatory Visit: Payer: Self-pay | Admitting: Hematology and Oncology

## 2016-03-16 ENCOUNTER — Ambulatory Visit
Admission: RE | Admit: 2016-03-16 | Discharge: 2016-03-16 | Disposition: A | Discharge status: Home / Self Care | Payer: PPO | Source: Ambulatory Visit | Attending: Hematology and Oncology | Admitting: Hematology and Oncology

## 2016-03-16 DIAGNOSIS — N644 Mastodynia: Secondary | ICD-10-CM

## 2016-03-16 DIAGNOSIS — C50912 Malignant neoplasm of unspecified site of left female breast: Secondary | ICD-10-CM

## 2016-03-17 ENCOUNTER — Other Ambulatory Visit: Payer: Self-pay | Admitting: *Deleted

## 2016-03-17 DIAGNOSIS — N644 Mastodynia: Secondary | ICD-10-CM

## 2016-03-17 DIAGNOSIS — C50912 Malignant neoplasm of unspecified site of left female breast: Secondary | ICD-10-CM

## 2016-03-20 ENCOUNTER — Inpatient Hospital Stay: Admission: RE | Admit: 2016-03-20 | Payer: PPO | Source: Ambulatory Visit

## 2016-06-07 ENCOUNTER — Encounter: Payer: Self-pay | Admitting: Hematology and Oncology

## 2016-07-13 DIAGNOSIS — I8311 Varicose veins of right lower extremity with inflammation: Secondary | ICD-10-CM | POA: Diagnosis not present

## 2016-07-13 DIAGNOSIS — I8312 Varicose veins of left lower extremity with inflammation: Secondary | ICD-10-CM | POA: Diagnosis not present

## 2016-07-13 DIAGNOSIS — L570 Actinic keratosis: Secondary | ICD-10-CM | POA: Diagnosis not present

## 2016-07-26 ENCOUNTER — Encounter: Payer: Self-pay | Admitting: *Deleted

## 2016-07-26 ENCOUNTER — Ambulatory Visit
Admission: EM | Admit: 2016-07-26 | Discharge: 2016-07-26 | Disposition: A | Discharge status: Home / Self Care | Payer: PPO | Attending: Family Medicine | Admitting: Family Medicine

## 2016-07-26 ENCOUNTER — Ambulatory Visit (INDEPENDENT_AMBULATORY_CARE_PROVIDER_SITE_OTHER): Payer: PPO

## 2016-07-26 DIAGNOSIS — S300XXA Contusion of lower back and pelvis, initial encounter: Secondary | ICD-10-CM

## 2016-07-26 DIAGNOSIS — S7002XA Contusion of left hip, initial encounter: Secondary | ICD-10-CM | POA: Diagnosis not present

## 2016-07-26 DIAGNOSIS — W19XXXA Unspecified fall, initial encounter: Secondary | ICD-10-CM

## 2016-07-26 DIAGNOSIS — M25552 Pain in left hip: Secondary | ICD-10-CM | POA: Diagnosis not present

## 2016-07-26 DIAGNOSIS — M533 Sacrococcygeal disorders, not elsewhere classified: Secondary | ICD-10-CM | POA: Diagnosis not present

## 2016-07-26 MED ORDER — TRAMADOL HCL 50 MG PO TABS: 50.0000 mg | ORAL_TABLET | Freq: Four times a day (QID) | ORAL | 0 refills | Status: DC | PRN

## 2016-07-26 NOTE — ED Provider Notes (Signed)
[facility]    CSN: [id number] Arrival date & time: 07/26/16  1512     History   Chief Complaint Chief Complaint  Patient presents with  . Hip Injury  . Tailbone Pain  . Head Injury    HPI Debra Patel is a 75 y.o. female.   75 yo female with a c/o left hip pain and tailbone pain after falling 2 days ago. Patient states she was bending over her dog while grooming her when the dog pulled her down. Patient states she hit the left side of her head against a door as she was falling but denies loss of consciousness. States she landed on her left hip and tailbone area hitting the concrete. Has been able to ambulate but painful. Denies any headaches, vision changes, numbness/tingling, one-sided weakness, bowel or bladder problems.   Of note patient reports that she take Eliquis only twice a week and not daily.    The history is provided by the patient.    Past Medical History:  Diagnosis Date  . BP (high blood pressure) 08/21/2014  . Brain infection   . Breast cancer ([place]) 2001   Left side mastectomy  . Radiation 2001   BREAST CA  . S/P chemotherapy, time since greater than 12 weeks 2001   BREAST CA  . Tachycardia   . Thyroid disease   . Tick fever     Patient Active Problem List   Diagnosis Date Noted  . 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 Fever 03/04/2015  . Febrile 03/04/2015  . Chest pain 02/28/2015  . Anxiety 10/23/2014  . Amnesia 10/23/2014  . Mild cognitive disorder 10/23/2014  . BP (high blood pressure) 08/21/2014  . 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 Adult hypothyroidism 11/24/2013  . Awareness of heartbeats 11/24/2013  . Mass of pelvis 11/15/2013  . Bad memory 07/27/2012  . Breast cancer, left ([place]) 04/21/2000    Past Surgical History:  Procedure Laterality Date  . ABDOMINAL HYSTERECTOMY    . BREAST BIOPSY Right 2002   EXCISIONAL - NEG  . HERNIA REPAIR    . Mass Removed from Bladder and Both Ovaries    .  MASTECTOMY Left 2001   BREAST CA  . THYROIDECTOMY      OB History    No data available       Home Medications    Prior to Admission medications   Medication Sig Start Date End Date Taking? Authorizing Provider  apixaban (ELIQUIS) 5 MG TABS tablet Take 1 tablet (5 mg total) by mouth 2 (two) times daily. Patient taking differently: Take 5 mg by mouth daily.  03/12/15  Yes Bettey Costa, MD  hydrochlorothiazide (HYDRODIURIL) 25 MG tablet Take 25 mg by mouth daily as needed (swelling).   Yes Historical Provider, MD  levothyroxine (SYNTHROID, LEVOTHROID) 50 MCG tablet Take 50 mcg by mouth every morning. (brand name only)   Yes Historical Provider, MD  metoprolol succinate (TOPROL-XL) 25 MG 24 hr tablet Take 25 mg by mouth daily.   Yes Historical Provider, MD  omeprazole (PRILOSEC) 20 MG capsule Take by mouth. 07/03/15  Yes Historical Provider, MD  apixaban (ELIQUIS) 5 MG TABS tablet Take 2 tablets (10 mg total) by mouth 2 (two) times daily. 03/07/15 03/12/15  Bettey Costa, MD    Family History Family History  Problem Relation Age of Onset  . Breast cancer Mother 48    Social History Social History  Substance Use Topics  . Smoking status:  Never Smoker  . Smokeless tobacco: Never Used  . Alcohol use No     Allergies   Pyridoxine; Hydrocodone; Bee venom; Motrin [ibuprofen]; Oxycodone; Latex; and Nickel   Review of Systems Review of Systems   Physical Exam Triage Vital Signs ED Triage Vitals  Enc Vitals Group     BP 07/26/16 1547 (!) 149/79     Pulse Rate 07/26/16 1547 63     Resp 07/26/16 1547 16     Temp 07/26/16 1547 98.3 F (36.8 C)     Temp src --      SpO2 07/26/16 1547 99 %     Weight 07/26/16 1549 160 lb (72.6 kg)     Height 07/26/16 1549 5\' 4"  (1.626 m)     Head Circumference --      Peak Flow --      Pain Score 07/26/16 1554 1     Pain Loc --      Pain Edu? --      Excl. in [place]? --    No data found.   Updated Vital Signs BP (!) 149/79 (BP Location: Right  Arm)   Pulse 63   Temp 98.3 F (36.8 C)   Resp 16   Ht 5\' 4"  (1.626 m)   Wt 160 lb (72.6 kg)   SpO2 99%   BMI 27.46 kg/m   Visual Acuity Right Eye Distance:   Left Eye Distance:   Bilateral Distance:    Right Eye Near:   Left Eye Near:    Bilateral Near:     Physical Exam  Constitutional: She is oriented to person, place, and time. She appears well-developed and well-nourished. No distress.  HENT:  Head: Normocephalic and atraumatic.  Right Ear: Tympanic membrane, external ear and ear canal normal.  Left Ear: Tympanic membrane, external ear and ear canal normal.  Nose: Nose normal.  Mouth/Throat: Oropharynx is clear and moist and mucous membranes are normal.  No scalp injuries noted  Eyes: Conjunctivae and EOM are normal. Pupils are equal, round, and reactive to light. Right eye exhibits no discharge. Left eye exhibits no discharge. No scleral icterus.  Neck: Normal range of motion. Neck supple. No JVD present. No tracheal deviation present. No thyromegaly present.  Pulmonary/Chest: Effort normal. No stridor. No respiratory distress.  Musculoskeletal: She exhibits no edema.       Left hip: She exhibits tenderness and bony tenderness. She exhibits normal range of motion, normal strength, no swelling, no crepitus, no deformity and no laceration.       Lumbar back: She exhibits tenderness and bony tenderness (over the sacrum). She exhibits normal range of motion, no swelling, no edema, no deformity, no laceration, no pain, no spasm and normal pulse.  Lymphadenopathy:    She has no cervical adenopathy.  Neurological: She is alert and oriented to person, place, and time. She has normal reflexes. She displays normal reflexes. No cranial nerve deficit or sensory deficit. She exhibits normal muscle tone. Coordination normal.  Skin: Skin is warm and dry. No rash noted. She is not diaphoretic. No erythema. No pallor.  Psychiatric: She has a normal mood and affect. Her behavior is normal.  Judgment and thought content normal.  Nursing note and vitals reviewed.    UC Treatments / Results  Labs (all labs ordered are listed, but only abnormal results are displayed) Labs Reviewed - No data to display  EKG  EKG Interpretation None       Radiology No results found.  Procedures Procedures (including critical care time)  Medications Ordered in UC Medications - No data to display   Initial Impression / Assessment and Plan / UC Course  I have reviewed the triage vital signs and the nursing notes.  Pertinent labs & imaging results that were available during my care of the patient were reviewed by me and considered in my medical decision making (see chart for details).  Clinical Course       Final Clinical Impressions(s) / UC Diagnoses   Final diagnoses:  Fall  Contusion of left hip, initial encounter  Contusion of sacrum, initial encounter    New Prescriptions New Prescriptions   No medications on file   1. x-ray results(negative for fracture) and diagnosis reviewed with patient 2. rx as per orders above; reviewed possible side effects, interactions, risks and benefits  3. Recommend supportive treatment with rest, ice 4. Follow-up prn if symptoms worsen or don't improve   Norval Gable, MD 07/26/16 1708

## 2016-07-26 NOTE — ED Triage Notes (Signed)
Patient was grooming her dog 2 days ago when he pulled her down injuring her left hip, arm, and tailbone. Patient also injured the left side of her head.

## 2016-07-29 ENCOUNTER — Telehealth: Payer: Self-pay | Admitting: *Deleted

## 2016-07-29 NOTE — Telephone Encounter (Signed)
Courtesy call back, verified DOB, patient reported feeling better, but still a little sore. Advised patient to follow up with PCP of [place] if symptoms suddenly become worse.

## 2016-08-10 DIAGNOSIS — E039 Hypothyroidism, unspecified: Secondary | ICD-10-CM | POA: Diagnosis not present

## 2016-08-10 DIAGNOSIS — M533 Sacrococcygeal disorders, not elsewhere classified: Secondary | ICD-10-CM | POA: Diagnosis not present

## 2016-08-10 DIAGNOSIS — I10 Essential (primary) hypertension: Secondary | ICD-10-CM | POA: Diagnosis not present

## 2016-10-06 DIAGNOSIS — J019 Acute sinusitis, unspecified: Secondary | ICD-10-CM | POA: Diagnosis not present

## 2016-11-23 DIAGNOSIS — R51 Headache: Secondary | ICD-10-CM | POA: Diagnosis not present

## 2016-11-23 DIAGNOSIS — M792 Neuralgia and neuritis, unspecified: Secondary | ICD-10-CM | POA: Diagnosis not present

## 2016-11-23 DIAGNOSIS — H903 Sensorineural hearing loss, bilateral: Secondary | ICD-10-CM | POA: Diagnosis not present

## 2016-12-03 DIAGNOSIS — R51 Headache: Secondary | ICD-10-CM | POA: Diagnosis not present

## 2016-12-04 DIAGNOSIS — F341 Dysthymic disorder: Secondary | ICD-10-CM | POA: Diagnosis not present

## 2016-12-04 DIAGNOSIS — R1013 Epigastric pain: Secondary | ICD-10-CM | POA: Diagnosis not present

## 2016-12-07 DIAGNOSIS — R51 Headache: Secondary | ICD-10-CM | POA: Diagnosis not present

## 2016-12-14 DIAGNOSIS — M1712 Unilateral primary osteoarthritis, left knee: Secondary | ICD-10-CM | POA: Diagnosis not present

## 2016-12-14 DIAGNOSIS — S8992XA Unspecified injury of left lower leg, initial encounter: Secondary | ICD-10-CM | POA: Diagnosis not present

## 2016-12-14 DIAGNOSIS — M25462 Effusion, left knee: Secondary | ICD-10-CM | POA: Diagnosis not present

## 2016-12-14 DIAGNOSIS — M79605 Pain in left leg: Secondary | ICD-10-CM | POA: Diagnosis not present

## 2016-12-14 DIAGNOSIS — W19XXXA Unspecified fall, initial encounter: Secondary | ICD-10-CM | POA: Diagnosis not present

## 2016-12-31 DIAGNOSIS — M1712 Unilateral primary osteoarthritis, left knee: Secondary | ICD-10-CM | POA: Diagnosis not present

## 2017-01-11 DIAGNOSIS — G3184 Mild cognitive impairment, so stated: Secondary | ICD-10-CM | POA: Diagnosis not present

## 2017-02-18 ENCOUNTER — Other Ambulatory Visit: Payer: Self-pay | Admitting: Nurse Practitioner

## 2017-02-18 DIAGNOSIS — Z1231 Encounter for screening mammogram for malignant neoplasm of breast: Secondary | ICD-10-CM

## 2017-03-09 DIAGNOSIS — H40003 Preglaucoma, unspecified, bilateral: Secondary | ICD-10-CM | POA: Diagnosis not present

## 2017-03-18 ENCOUNTER — Ambulatory Visit: Payer: PPO

## 2017-04-01 ENCOUNTER — Ambulatory Visit
Admission: RE | Admit: 2017-04-01 | Discharge: 2017-04-01 | Disposition: A | Discharge status: Home / Self Care | Payer: PPO | Source: Ambulatory Visit | Attending: Nurse Practitioner | Admitting: Nurse Practitioner

## 2017-04-01 DIAGNOSIS — Z1231 Encounter for screening mammogram for malignant neoplasm of breast: Secondary | ICD-10-CM | POA: Insufficient documentation

## 2017-04-01 HISTORY — DX: Personal history of antineoplastic chemotherapy: Z92.21

## 2017-04-01 HISTORY — DX: Personal history of irradiation: Z92.3

## 2017-04-13 DIAGNOSIS — K219 Gastro-esophageal reflux disease without esophagitis: Secondary | ICD-10-CM | POA: Insufficient documentation

## 2017-04-13 DIAGNOSIS — Z86711 Personal history of pulmonary embolism: Secondary | ICD-10-CM | POA: Diagnosis not present

## 2017-04-13 DIAGNOSIS — R600 Localized edema: Secondary | ICD-10-CM | POA: Diagnosis not present

## 2017-04-13 DIAGNOSIS — Z87898 Personal history of other specified conditions: Secondary | ICD-10-CM | POA: Diagnosis not present

## 2017-04-13 DIAGNOSIS — G3184 Mild cognitive impairment, so stated: Secondary | ICD-10-CM | POA: Diagnosis not present

## 2017-04-13 DIAGNOSIS — E039 Hypothyroidism, unspecified: Secondary | ICD-10-CM | POA: Diagnosis not present

## 2017-04-13 DIAGNOSIS — Z79899 Other long term (current) drug therapy: Secondary | ICD-10-CM | POA: Diagnosis not present

## 2017-04-13 DIAGNOSIS — I10 Essential (primary) hypertension: Secondary | ICD-10-CM | POA: Diagnosis not present

## 2017-05-11 DIAGNOSIS — I10 Essential (primary) hypertension: Secondary | ICD-10-CM | POA: Diagnosis not present

## 2017-05-11 DIAGNOSIS — Z86711 Personal history of pulmonary embolism: Secondary | ICD-10-CM | POA: Diagnosis not present

## 2017-05-11 DIAGNOSIS — I493 Ventricular premature depolarization: Secondary | ICD-10-CM | POA: Insufficient documentation

## 2017-07-07 DIAGNOSIS — J019 Acute sinusitis, unspecified: Secondary | ICD-10-CM | POA: Diagnosis not present

## 2017-07-13 DIAGNOSIS — G3184 Mild cognitive impairment, so stated: Secondary | ICD-10-CM | POA: Diagnosis not present

## 2017-09-02 DIAGNOSIS — R002 Palpitations: Secondary | ICD-10-CM | POA: Diagnosis not present

## 2017-09-02 DIAGNOSIS — I340 Nonrheumatic mitral (valve) insufficiency: Secondary | ICD-10-CM | POA: Diagnosis not present

## 2017-09-02 DIAGNOSIS — Z86711 Personal history of pulmonary embolism: Secondary | ICD-10-CM | POA: Diagnosis not present

## 2017-09-02 DIAGNOSIS — I10 Essential (primary) hypertension: Secondary | ICD-10-CM | POA: Diagnosis not present

## 2017-09-02 DIAGNOSIS — I499 Cardiac arrhythmia, unspecified: Secondary | ICD-10-CM | POA: Diagnosis not present

## 2017-09-02 DIAGNOSIS — I493 Ventricular premature depolarization: Secondary | ICD-10-CM | POA: Diagnosis not present

## 2017-10-04 ENCOUNTER — Encounter: Payer: Self-pay | Admitting: Emergency Medicine

## 2017-10-04 ENCOUNTER — Other Ambulatory Visit: Payer: Self-pay

## 2017-10-04 ENCOUNTER — Ambulatory Visit
Admission: EM | Admit: 2017-10-04 | Discharge: 2017-10-04 | Disposition: A | Discharge status: Home / Self Care | Payer: PPO | Attending: Family Medicine | Admitting: Family Medicine

## 2017-10-04 DIAGNOSIS — R21 Rash and other nonspecific skin eruption: Secondary | ICD-10-CM | POA: Diagnosis not present

## 2017-10-04 DIAGNOSIS — R0602 Shortness of breath: Secondary | ICD-10-CM

## 2017-10-04 DIAGNOSIS — L500 Allergic urticaria: Secondary | ICD-10-CM

## 2017-10-04 DIAGNOSIS — T7840XA Allergy, unspecified, initial encounter: Secondary | ICD-10-CM | POA: Diagnosis not present

## 2017-10-04 DIAGNOSIS — R079 Chest pain, unspecified: Secondary | ICD-10-CM

## 2017-10-04 DIAGNOSIS — R0789 Other chest pain: Secondary | ICD-10-CM | POA: Diagnosis not present

## 2017-10-04 MED ORDER — EPINEPHRINE PF 1 MG/ML IJ SOLN
0.3000 mg | Freq: Once | INTRAMUSCULAR | Status: AC
  Administered 2017-10-04: 0.3 mg via SUBCUTANEOUS

## 2017-10-04 MED ORDER — DIPHENHYDRAMINE HCL 50 MG PO CAPS
50.0000 mg | ORAL_CAPSULE | Freq: Four times a day (QID) | ORAL | Status: DC | PRN
  Administered 2017-10-04: 50 mg via ORAL

## 2017-10-04 MED ORDER — EPINEPHRINE 0.3 MG/0.3ML IJ SOAJ: 0.3000 mg | Freq: Once | INTRAMUSCULAR | 0 refills | Status: AC

## 2017-10-04 NOTE — ED Provider Notes (Signed)
[facility]    CSN: [id number] Arrival date & time: 10/04/17  1627     History   Chief Complaint Chief Complaint  Patient presents with  . Rash  . Chest Pain  . Shortness of Breath    HPI Debra Patel is a 77 y.o. female.   77 yo female with a c/o rash and itching that started suddenly while at [facility] about 30 minutes ago. Also c/o right sided chest pain and shortness of breath. Denies any fevers, chills, neck/jaw pain, diaphoresis. States has had a similar allergic reaction in the past. Denies tongue or throat swelling.     Rash  Location:  Full body Associated symptoms: shortness of breath   Chest Pain  Associated symptoms: shortness of breath   Shortness of Breath  Associated symptoms: chest pain and rash     Past Medical History:  Diagnosis Date  . BP (high blood pressure) 08/21/2014  . Brain infection   . Breast cancer ([place]) 2001   Left side mastectomy  . Personal history of chemotherapy 2001   BREAST CA  . Personal history of radiation therapy 2001   BREAST CA  . Radiation 2001   BREAST CA  . S/P chemotherapy, time since greater than 12 weeks 2001   BREAST CA  . Tachycardia   . Thyroid disease   . Tick fever     Patient Active Problem List   Diagnosis Date Noted  . 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 Fever 03/04/2015  . Febrile 03/04/2015  . Chest pain 02/28/2015  . Anxiety 10/23/2014  . Amnesia 10/23/2014  . Mild cognitive disorder 10/23/2014  . BP (high blood pressure) 08/21/2014  . 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 Adult hypothyroidism 11/24/2013  . Awareness of heartbeats 11/24/2013  . Mass of pelvis 11/15/2013  . Bad memory 07/27/2012  . Breast cancer, left ([place]) 04/21/2000    Past Surgical History:  Procedure Laterality Date  . ABDOMINAL HYSTERECTOMY    . BREAST EXCISIONAL BIOPSY Right 2002   EXCISIONAL - NEG  . HERNIA REPAIR    . Mass Removed from Bladder and Both Ovaries    . MASTECTOMY  Left 2001   BREAST CA  . THYROIDECTOMY      OB History    No data available       Home Medications    Prior to Admission medications   Medication Sig Start Date End Date Taking? Authorizing Provider  levothyroxine (SYNTHROID, LEVOTHROID) 50 MCG tablet Take 50 mcg by mouth every morning. (brand name only)   Yes [provider]  metoprolol succinate (TOPROL-XL) 25 MG 24 hr tablet Take 25 mg by mouth daily.   Yes [provider]  apixaban (ELIQUIS) 5 MG TABS tablet Take 1 tablet (5 mg total) by mouth 2 (two) times daily. Patient taking differently: Take 5 mg by mouth daily.  03/12/15   Bettey Costa, MD  apixaban (ELIQUIS) 5 MG TABS tablet Take 2 tablets (10 mg total) by mouth 2 (two) times daily. 03/07/15 03/12/15  Bettey Costa, MD  hydrochlorothiazide (HYDRODIURIL) 25 MG tablet Take 25 mg by mouth daily as needed (swelling).    [provider]  omeprazole (PRILOSEC) 20 MG capsule Take by mouth. 07/03/15   [provider]  traMADol (ULTRAM) 50 MG tablet Take 1 tablet (50 mg total) by mouth every 6 (six) hours as needed. 07/26/16   Norval Gable, MD    Family History Family History  Problem Relation  Age of Onset  . Breast cancer Mother 37    Social History Social History   Tobacco Use  . Smoking status: Never Smoker  . Smokeless tobacco: Never Used  Substance Use Topics  . Alcohol use: No  . Drug use: No     Allergies   Pyridoxine; Hydrocodone; Bee venom; Motrin [ibuprofen]; Oxycodone; Latex; and Nickel   Review of Systems Review of Systems  Respiratory: Positive for shortness of breath.   Cardiovascular: Positive for chest pain.  Skin: Positive for rash.     Physical Exam Triage Vital Signs ED Triage Vitals  Enc Vitals Group     BP 10/04/17 1640 (!) 180/95     Pulse Rate 10/04/17 1640 80     Resp 10/04/17 1640 20     Temp 10/04/17 1640 97.7 F (36.5 C)     Temp Source 10/04/17 1640 Oral     SpO2 10/04/17 1640 100 %      Weight 10/04/17 1640 149 lb (67.6 kg)     Height 10/04/17 1640 5' 4.5" (1.638 m)     Head Circumference --      Peak Flow --      Pain Score 10/04/17 1641 4     Pain Loc --      Pain Edu? --      Excl. in [place]? --    No data found.  Updated Vital Signs BP (!) 180/95 (BP Location: Right Arm)   Pulse 72   Temp 97.7 F (36.5 C) (Oral)   Resp 20   Ht 5' 4.5" (1.638 m)   Wt 149 lb (67.6 kg)   SpO2 99%   BMI 25.18 kg/m   Visual Acuity Right Eye Distance:   Left Eye Distance:   Bilateral Distance:    Right Eye Near:   Left Eye Near:    Bilateral Near:     Physical Exam  Constitutional: She is oriented to person, place, and time. She appears well-developed and well-nourished. No distress.  HENT:  Head: Normocephalic and atraumatic.  Right Ear: Tympanic membrane, external ear and ear canal normal.  Left Ear: Tympanic membrane, external ear and ear canal normal.  Nose: No mucosal edema, rhinorrhea, nose lacerations, sinus tenderness, nasal deformity, septal deviation or nasal septal hematoma. No epistaxis.  No foreign bodies. Right sinus exhibits no maxillary sinus tenderness and no frontal sinus tenderness. Left sinus exhibits no maxillary sinus tenderness and no frontal sinus tenderness.  Mouth/Throat: Uvula is midline, oropharynx is clear and moist and mucous membranes are normal. No oropharyngeal exudate.  Eyes: Conjunctivae and EOM are normal. Pupils are equal, round, and reactive to light. Right eye exhibits no discharge. Left eye exhibits no discharge. No scleral icterus.  Neck: Normal range of motion. Neck supple. No thyromegaly present.  Cardiovascular: Normal rate, regular rhythm and normal heart sounds.  Pulmonary/Chest: Effort normal and breath sounds normal. No stridor. No respiratory distress. She has no wheezes. She has no rales.  Lymphadenopathy:    She has no cervical adenopathy.  Neurological: She is alert and oriented to person, place, and time. She displays  normal reflexes. No cranial nerve deficit or sensory deficit. She exhibits normal muscle tone. Coordination normal.  Skin: Rash noted. Rash is urticarial. She is not diaphoretic.  Nursing note and vitals reviewed.    UC Treatments / Results  Labs (all labs ordered are listed, but only abnormal results are displayed) Labs Reviewed - No data to display  EKG  EKG Interpretation None  Radiology No results found.  Procedures ED EKG Date/Time: 10/04/2017 9:31 PM Performed by: Norval Gable, MD Authorized by: Norval Gable, MD   ECG reviewed by ED Physician in the absence of a cardiologist: yes   Previous ECG:    Previous ECG:  Unavailable Interpretation:    Interpretation: normal   Rate:    ECG rate:  60   ECG rate assessment: normal   Rhythm:    Rhythm: sinus rhythm   Ectopy:    Ectopy: none   QRS:    QRS axis:  Normal   QRS intervals:  Normal Conduction:    Conduction: normal   ST segments:    ST segments:  Normal T waves:    T waves: normal      (including critical care time)  Medications Ordered in UC Medications  EPINEPHrine (ADRENALIN) 0.3 mg (0.3 mg Subcutaneous Given 10/04/17 1705)     Initial Impression / Assessment and Plan / UC Course  I have reviewed the triage vital signs and the nursing notes.  Pertinent labs & imaging results that were available during my care of the patient were reviewed by me and considered in my medical decision making (see chart for details).       Final Clinical Impressions(s) / UC Diagnoses   Final diagnoses:  Allergic reaction, initial encounter    ED Discharge Orders        Ordered    EPINEPHrine 0.3 mg/0.3 mL IJ SOAJ injection   Once    Comments:  Dispense 2 pens   10/04/17 1829     1.ekg results and diagnosis reviewed with patient 2. Patient given benadryl 50mg  po x 1 and epinephrine 0.3mg  SQ x 1 with resolution of symptoms 3. rx as per orders above; reviewed possible side effects, interactions,  risks and benefits  4. Recommend supportive treatment with benadryl prn 5. Follow-up prn if symptoms worsen or don't improve  Controlled Substance Prescriptions [place] Controlled Substance Registry consulted? Not Applicable   Norval Gable, MD 10/29/17 [phone]

## 2017-10-04 NOTE — ED Triage Notes (Signed)
Patient in today c/o rash and itching. Patient states it started suddenly while she was at [facility] at 4:00pm. Patient is c/o chest pain and sob. Patient states the pain in her chest is on the right side and in her shoulder blade area.

## 2017-10-07 ENCOUNTER — Telehealth: Payer: Self-pay | Admitting: Emergency Medicine

## 2017-10-07 NOTE — Telephone Encounter (Signed)
Called to follow up from patient's recent visit. Patient states that she is much better.

## 2017-10-13 DIAGNOSIS — R002 Palpitations: Secondary | ICD-10-CM | POA: Diagnosis not present

## 2017-10-13 DIAGNOSIS — I340 Nonrheumatic mitral (valve) insufficiency: Secondary | ICD-10-CM | POA: Diagnosis not present

## 2017-10-13 DIAGNOSIS — I10 Essential (primary) hypertension: Secondary | ICD-10-CM | POA: Diagnosis not present

## 2017-10-13 DIAGNOSIS — I493 Ventricular premature depolarization: Secondary | ICD-10-CM | POA: Diagnosis not present

## 2017-10-22 DIAGNOSIS — L209 Atopic dermatitis, unspecified: Secondary | ICD-10-CM | POA: Diagnosis not present

## 2017-10-22 DIAGNOSIS — L501 Idiopathic urticaria: Secondary | ICD-10-CM | POA: Diagnosis not present

## 2017-12-09 DIAGNOSIS — J111 Influenza due to unidentified influenza virus with other respiratory manifestations: Secondary | ICD-10-CM | POA: Diagnosis not present

## 2017-12-16 DIAGNOSIS — Z91018 Allergy to other foods: Secondary | ICD-10-CM | POA: Diagnosis not present

## 2017-12-27 DIAGNOSIS — K5900 Constipation, unspecified: Secondary | ICD-10-CM | POA: Diagnosis not present

## 2017-12-27 DIAGNOSIS — N3001 Acute cystitis with hematuria: Secondary | ICD-10-CM | POA: Diagnosis not present

## 2017-12-27 DIAGNOSIS — R300 Dysuria: Secondary | ICD-10-CM | POA: Diagnosis not present

## 2018-01-11 DIAGNOSIS — R079 Chest pain, unspecified: Secondary | ICD-10-CM | POA: Diagnosis not present

## 2018-01-11 DIAGNOSIS — I493 Ventricular premature depolarization: Secondary | ICD-10-CM | POA: Diagnosis not present

## 2018-01-11 DIAGNOSIS — I10 Essential (primary) hypertension: Secondary | ICD-10-CM | POA: Diagnosis not present

## 2018-01-26 DIAGNOSIS — D485 Neoplasm of uncertain behavior of skin: Secondary | ICD-10-CM | POA: Diagnosis not present

## 2018-01-26 DIAGNOSIS — L570 Actinic keratosis: Secondary | ICD-10-CM | POA: Diagnosis not present

## 2018-02-05 DIAGNOSIS — C44722 Squamous cell carcinoma of skin of right lower limb, including hip: Secondary | ICD-10-CM | POA: Diagnosis not present

## 2018-02-23 DIAGNOSIS — C44722 Squamous cell carcinoma of skin of right lower limb, including hip: Secondary | ICD-10-CM | POA: Diagnosis not present

## 2018-03-02 DIAGNOSIS — L03119 Cellulitis of unspecified part of limb: Secondary | ICD-10-CM | POA: Diagnosis not present

## 2018-03-16 DIAGNOSIS — E039 Hypothyroidism, unspecified: Secondary | ICD-10-CM | POA: Diagnosis not present

## 2018-03-16 DIAGNOSIS — I10 Essential (primary) hypertension: Secondary | ICD-10-CM | POA: Diagnosis not present

## 2018-03-17 ENCOUNTER — Other Ambulatory Visit: Payer: Self-pay | Admitting: Family Medicine

## 2018-03-17 DIAGNOSIS — Z1231 Encounter for screening mammogram for malignant neoplasm of breast: Secondary | ICD-10-CM

## 2018-03-18 DIAGNOSIS — E039 Hypothyroidism, unspecified: Secondary | ICD-10-CM | POA: Diagnosis not present

## 2018-03-18 DIAGNOSIS — I10 Essential (primary) hypertension: Secondary | ICD-10-CM | POA: Diagnosis not present

## 2018-03-22 DIAGNOSIS — K219 Gastro-esophageal reflux disease without esophagitis: Secondary | ICD-10-CM | POA: Diagnosis not present

## 2018-03-22 DIAGNOSIS — R1310 Dysphagia, unspecified: Secondary | ICD-10-CM | POA: Diagnosis not present

## 2018-04-02 DIAGNOSIS — S199XXA Unspecified injury of neck, initial encounter: Secondary | ICD-10-CM | POA: Diagnosis not present

## 2018-04-02 DIAGNOSIS — Z888 Allergy status to other drugs, medicaments and biological substances status: Secondary | ICD-10-CM | POA: Diagnosis not present

## 2018-04-02 DIAGNOSIS — Z91040 Latex allergy status: Secondary | ICD-10-CM | POA: Diagnosis not present

## 2018-04-02 DIAGNOSIS — R42 Dizziness and giddiness: Secondary | ICD-10-CM | POA: Diagnosis not present

## 2018-04-02 DIAGNOSIS — M47812 Spondylosis without myelopathy or radiculopathy, cervical region: Secondary | ICD-10-CM | POA: Diagnosis not present

## 2018-04-02 DIAGNOSIS — G3184 Mild cognitive impairment, so stated: Secondary | ICD-10-CM | POA: Diagnosis not present

## 2018-04-02 DIAGNOSIS — K219 Gastro-esophageal reflux disease without esophagitis: Secondary | ICD-10-CM | POA: Diagnosis not present

## 2018-04-02 DIAGNOSIS — Z885 Allergy status to narcotic agent status: Secondary | ICD-10-CM | POA: Diagnosis not present

## 2018-04-02 DIAGNOSIS — R110 Nausea: Secondary | ICD-10-CM | POA: Diagnosis not present

## 2018-04-02 DIAGNOSIS — Z7982 Long term (current) use of aspirin: Secondary | ICD-10-CM | POA: Diagnosis not present

## 2018-04-02 DIAGNOSIS — I10 Essential (primary) hypertension: Secondary | ICD-10-CM | POA: Diagnosis not present

## 2018-04-02 DIAGNOSIS — Z79899 Other long term (current) drug therapy: Secondary | ICD-10-CM | POA: Diagnosis not present

## 2018-04-02 DIAGNOSIS — R51 Headache: Secondary | ICD-10-CM | POA: Diagnosis not present

## 2018-04-02 DIAGNOSIS — S0990XA Unspecified injury of head, initial encounter: Secondary | ICD-10-CM | POA: Diagnosis not present

## 2018-04-02 DIAGNOSIS — R509 Fever, unspecified: Secondary | ICD-10-CM | POA: Diagnosis not present

## 2018-04-02 DIAGNOSIS — E039 Hypothyroidism, unspecified: Secondary | ICD-10-CM | POA: Diagnosis not present

## 2018-04-04 DIAGNOSIS — Z1211 Encounter for screening for malignant neoplasm of colon: Secondary | ICD-10-CM | POA: Diagnosis not present

## 2018-04-04 DIAGNOSIS — R1030 Lower abdominal pain, unspecified: Secondary | ICD-10-CM | POA: Diagnosis not present

## 2018-04-05 DIAGNOSIS — R1030 Lower abdominal pain, unspecified: Secondary | ICD-10-CM | POA: Diagnosis not present

## 2018-04-05 DIAGNOSIS — Z853 Personal history of malignant neoplasm of breast: Secondary | ICD-10-CM | POA: Diagnosis not present

## 2018-04-05 DIAGNOSIS — R590 Localized enlarged lymph nodes: Secondary | ICD-10-CM | POA: Diagnosis not present

## 2018-04-05 DIAGNOSIS — R509 Fever, unspecified: Secondary | ICD-10-CM | POA: Diagnosis not present

## 2018-04-05 DIAGNOSIS — R933 Abnormal findings on diagnostic imaging of other parts of digestive tract: Secondary | ICD-10-CM | POA: Diagnosis not present

## 2018-04-22 DIAGNOSIS — R1084 Generalized abdominal pain: Secondary | ICD-10-CM | POA: Diagnosis not present

## 2018-04-22 DIAGNOSIS — K5730 Diverticulosis of large intestine without perforation or abscess without bleeding: Secondary | ICD-10-CM | POA: Diagnosis not present

## 2018-04-22 DIAGNOSIS — K644 Residual hemorrhoidal skin tags: Secondary | ICD-10-CM | POA: Diagnosis not present

## 2018-04-22 DIAGNOSIS — Z1211 Encounter for screening for malignant neoplasm of colon: Secondary | ICD-10-CM | POA: Diagnosis not present

## 2018-04-26 DIAGNOSIS — L03119 Cellulitis of unspecified part of limb: Secondary | ICD-10-CM | POA: Diagnosis not present

## 2018-05-17 DIAGNOSIS — C50012 Malignant neoplasm of nipple and areola, left female breast: Secondary | ICD-10-CM | POA: Diagnosis not present

## 2018-05-17 DIAGNOSIS — M810 Age-related osteoporosis without current pathological fracture: Secondary | ICD-10-CM | POA: Diagnosis not present

## 2018-05-17 DIAGNOSIS — Z13820 Encounter for screening for osteoporosis: Secondary | ICD-10-CM | POA: Diagnosis not present

## 2018-05-17 DIAGNOSIS — M8580 Other specified disorders of bone density and structure, unspecified site: Secondary | ICD-10-CM | POA: Diagnosis not present

## 2018-05-17 DIAGNOSIS — M898X9 Other specified disorders of bone, unspecified site: Secondary | ICD-10-CM | POA: Diagnosis not present

## 2018-05-24 DIAGNOSIS — M8580 Other specified disorders of bone density and structure, unspecified site: Secondary | ICD-10-CM | POA: Diagnosis not present

## 2018-05-24 DIAGNOSIS — M8588 Other specified disorders of bone density and structure, other site: Secondary | ICD-10-CM | POA: Diagnosis not present

## 2018-05-24 DIAGNOSIS — M810 Age-related osteoporosis without current pathological fracture: Secondary | ICD-10-CM | POA: Diagnosis not present

## 2018-05-24 DIAGNOSIS — Z780 Asymptomatic menopausal state: Secondary | ICD-10-CM | POA: Diagnosis not present

## 2018-05-24 DIAGNOSIS — Z13820 Encounter for screening for osteoporosis: Secondary | ICD-10-CM | POA: Diagnosis not present

## 2018-05-24 DIAGNOSIS — C50012 Malignant neoplasm of nipple and areola, left female breast: Secondary | ICD-10-CM | POA: Diagnosis not present

## 2018-06-14 ENCOUNTER — Ambulatory Visit (INDEPENDENT_AMBULATORY_CARE_PROVIDER_SITE_OTHER): Payer: PPO

## 2018-06-14 ENCOUNTER — Ambulatory Visit
Admission: EM | Admit: 2018-06-14 | Discharge: 2018-06-14 | Disposition: A | Discharge status: Home / Self Care | Payer: PPO | Attending: Family Medicine | Admitting: Family Medicine

## 2018-06-14 ENCOUNTER — Encounter: Payer: Self-pay | Admitting: Emergency Medicine

## 2018-06-14 ENCOUNTER — Other Ambulatory Visit: Payer: Self-pay

## 2018-06-14 DIAGNOSIS — S82232A Displaced oblique fracture of shaft of left tibia, initial encounter for closed fracture: Secondary | ICD-10-CM | POA: Diagnosis not present

## 2018-06-14 DIAGNOSIS — W010XXA Fall on same level from slipping, tripping and stumbling without subsequent striking against object, initial encounter: Secondary | ICD-10-CM | POA: Diagnosis not present

## 2018-06-14 DIAGNOSIS — S82132B Displaced fracture of medial condyle of left tibia, initial encounter for open fracture type I or II: Secondary | ICD-10-CM | POA: Diagnosis not present

## 2018-06-14 MED ORDER — ONDANSETRON 8 MG PO TBDP: 8.0000 mg | ORAL_TABLET | Freq: Three times a day (TID) | ORAL | 0 refills | Status: DC | PRN

## 2018-06-14 MED ORDER — TRAMADOL HCL 50 MG PO TABS: ORAL_TABLET | ORAL | 0 refills | Status: DC

## 2018-06-14 MED ORDER — ACETAMINOPHEN 500 MG PO TABS
1000.0000 mg | ORAL_TABLET | Freq: Once | ORAL | Status: AC
  Administered 2018-06-14: 1000 mg via ORAL

## 2018-06-14 NOTE — ED Provider Notes (Signed)
[facility]    CSN: [id number] Arrival date & time: 06/14/18  1813     History   Chief Complaint Chief Complaint  Patient presents with  . Fall    HPI Debra Patel is a 77 y.o. female.   77 yo female with a c/o left knee pain and swelling today since falling earlier today. States she tripped over a rug and landed on her left knee. Has been unable to bear weight.   The history is provided by the patient.  Fall     Past Medical History:  Diagnosis Date  . BP (high blood pressure) 08/21/2014  . Brain infection   . Breast cancer ([place]) 2001   Left side mastectomy  . Personal history of chemotherapy 2001   BREAST CA  . Personal history of radiation therapy 2001   BREAST CA  . Radiation 2001   BREAST CA  . S/P chemotherapy, time since greater than 12 weeks 2001   BREAST CA  . Tachycardia   . Thyroid disease   . Tick fever     Patient Active Problem List   Diagnosis Date Noted  . 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 Fever 03/04/2015  . Febrile 03/04/2015  . Chest pain 02/28/2015  . Anxiety 10/23/2014  . Amnesia 10/23/2014  . Mild cognitive disorder 10/23/2014  . BP (high blood pressure) 08/21/2014  . 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 Adult hypothyroidism 11/24/2013  . Awareness of heartbeats 11/24/2013  . Mass of pelvis 11/15/2013  . Bad memory 07/27/2012  . Breast cancer, left ([place]) 04/21/2000    Past Surgical History:  Procedure Laterality Date  . ABDOMINAL HYSTERECTOMY    . BREAST EXCISIONAL BIOPSY Right 2002   EXCISIONAL - NEG  . HERNIA REPAIR    . Mass Removed from Bladder and Both Ovaries    . MASTECTOMY Left 2001   BREAST CA  . THYROIDECTOMY      OB History   None      Home Medications    Prior to Admission medications   Medication Sig Start Date End Date Taking? Authorizing Provider  apixaban (ELIQUIS) 5 MG TABS tablet Take 1 tablet (5 mg total) by mouth 2 (two) times daily. Patient taking  differently: Take 5 mg by mouth daily.  03/12/15  Yes Mody, Ulice Bold, MD  hydrochlorothiazide (HYDRODIURIL) 25 MG tablet Take 25 mg by mouth daily as needed (swelling).   Yes [provider]  levothyroxine (SYNTHROID, LEVOTHROID) 50 MCG tablet Take 50 mcg by mouth every morning. (brand name only)   Yes [provider]  metoprolol succinate (TOPROL-XL) 25 MG 24 hr tablet Take 25 mg by mouth daily.   Yes [provider]  omeprazole (PRILOSEC) 20 MG capsule Take by mouth. 07/03/15  Yes [provider]  apixaban (ELIQUIS) 5 MG TABS tablet Take 2 tablets (10 mg total) by mouth 2 (two) times daily. 03/07/15 03/12/15  Bettey Costa, MD  traMADol Veatrice Bourbon) 50 MG tablet 1-2 tabs po q 8 hours prn 06/14/18   Norval Gable, MD    Family History Family History  Problem Relation Age of Onset  . Breast cancer Mother 25    Social History Social History   Tobacco Use  . Smoking status: Never Smoker  . Smokeless tobacco: Never Used  Substance Use Topics  . Alcohol use: No  . Drug use: No     Allergies   Pyridoxine; Hydrocodone; Bee venom; Motrin [ibuprofen]; Oxycodone; Latex; and Nickel  Review of Systems Review of Systems   Physical Exam Triage Vital Signs ED Triage Vitals  Enc Vitals Group     BP 06/14/18 1909 (!) 143/85     Pulse Rate 06/14/18 1909 70     Resp 06/14/18 1909 18     Temp 06/14/18 1909 98.3 F (36.8 C)     Temp Source 06/14/18 1909 Oral     SpO2 06/14/18 1909 100 %     Weight 06/14/18 1906 141 lb (64 kg)     Height 06/14/18 1906 5\' 4"  (1.626 m)     Head Circumference --      Peak Flow --      Pain Score 06/14/18 1906 8     Pain Loc --      Pain Edu? --      Excl. in [place]? --    No data found.  Updated Vital Signs BP (!) 143/85 (BP Location: Right Arm)   Pulse 70   Temp 98.3 F (36.8 C) (Oral)   Resp 18   Ht 5\' 4"  (1.626 m)   Wt 64 kg   SpO2 100%   BMI 24.20 kg/m   Visual Acuity Right Eye Distance:   Left Eye Distance:     Bilateral Distance:    Right Eye Near:   Left Eye Near:    Bilateral Near:     Physical Exam  Constitutional: She appears well-developed and well-nourished. No distress.  Musculoskeletal:       Left knee: She exhibits decreased range of motion, swelling and bony tenderness (over proximal tibia). She exhibits no effusion, no ecchymosis, no deformity, no laceration, no erythema, normal alignment, no LCL laxity, normal patellar mobility, normal meniscus and no MCL laxity. Tenderness found. Patellar tendon tenderness noted. No medial joint line, no lateral joint line, no MCL and no LCL tenderness noted.  Skin: She is not diaphoretic.  Nursing note and vitals reviewed.    UC Treatments / Results  Labs (all labs ordered are listed, but only abnormal results are displayed) Labs Reviewed - No data to display  EKG None  Radiology Dg Tibia/fibula Left  Result Date: 06/14/2018 CLINICAL DATA:  Left knee pain after fall. EXAM: LEFT TIBIA AND FIBULA - 2 VIEW COMPARISON:  None. FINDINGS: The fibula appears normal. No soft tissue abnormality is noted. Mildly displaced longitudinal fracture is seen involving the medial portion of the proximal left tibia; intra-articular extension cannot be excluded. IMPRESSION: Mildly displaced longitudinal fracture seen involving the medial portion of proximal left tibia. CT scan of the left knee is recommended to evaluate for intra-articular extension. Electronically Signed   By: Marijo Conception, M.D.   On: 06/14/2018 20:23   Dg Knee Complete 4 Views Left  Result Date: 06/14/2018 CLINICAL DATA:  Fall with left knee pain and swelling EXAM: LEFT KNEE - COMPLETE 4+ VIEW COMPARISON:  None. FINDINGS: Large suprapatellar lipohemarthrosis. Medial tibial plateau vertical fracture with 3 mm medial displacement of the dominant fracture fragment. No dislocation. No suspicious focal osseous lesion. 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 No radiopaque foreign body. IMPRESSION: 1. Medial tibial plateau fracture with mild displacement. 2. Large suprapatellar lipohemarthrosis. 3. 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 Electronically Signed   By: Ilona Sorrel M.D.   On: 06/14/2018 20:23    Procedures Procedures (including critical care time)  Medications Ordered in UC Medications  acetaminophen (TYLENOL) tablet 1,000 mg (1,000 mg Oral Given 06/14/18 2051)  Initial Impression / Assessment and Plan / UC Course  I have reviewed the triage vital signs and the nursing notes.  Pertinent labs & imaging results that were available during my care of the patient were reviewed by me and considered in my medical decision making (see chart for details).      Final Clinical Impressions(s) / UC Diagnoses   Final diagnoses:  Displaced fracture of medial condyle of left tibia, initial encounter for open fracture type I or II     Discharge Instructions     No weight bearing Call orthopedist tomorrow morning for follow up appointment this week    ED Prescriptions    Medication Sig Dispense Auth. Provider   traMADol (ULTRAM) 50 MG tablet 1-2 tabs po q 8 hours prn 20 tablet Jaylena Holloway, MD     1. x-ray results and diagnosis reviewed with patient 2. rx as per orders above; reviewed possible side effects, interactions, risks and benefits  3. Recommend supportive treatment with rest, elevate 4. Follow up with orthopedist this week; discussed with Dr. Mack Guise (orthopedit on call) 5. Follow-up here prn  Controlled Substance Prescriptions [place] Controlled Substance Registry consulted? Not Applicable   Norval Gable, MD 06/14/18 2052

## 2018-06-14 NOTE — Discharge Instructions (Addendum)
No weight bearing Call orthopedist tomorrow morning for follow up appointment this week

## 2018-06-14 NOTE — ED Triage Notes (Signed)
Patient c/o fall today hitting her left knee. Patient now has pain and swelling to her knee and unable to bear weight.

## 2018-06-15 DIAGNOSIS — S82115A Nondisplaced fracture of left tibial spine, initial encounter for closed fracture: Secondary | ICD-10-CM | POA: Diagnosis not present

## 2018-06-22 DIAGNOSIS — S82115A Nondisplaced fracture of left tibial spine, initial encounter for closed fracture: Secondary | ICD-10-CM | POA: Diagnosis not present

## 2018-06-28 ENCOUNTER — Inpatient Hospital Stay
Admission: EM | Admit: 2018-06-28 | Discharge: 2018-07-01 | DRG: 481 | Disposition: A | Discharge status: Skilled Nursing Facility | Payer: PPO | Attending: Internal Medicine | Admitting: Internal Medicine

## 2018-06-28 ENCOUNTER — Emergency Department: Payer: PPO

## 2018-06-28 ENCOUNTER — Other Ambulatory Visit: Payer: Self-pay

## 2018-06-28 DIAGNOSIS — R42 Dizziness and giddiness: Secondary | ICD-10-CM | POA: Diagnosis not present

## 2018-06-28 DIAGNOSIS — S82142A Displaced bicondylar fracture of left tibia, initial encounter for closed fracture: Secondary | ICD-10-CM | POA: Diagnosis present

## 2018-06-28 DIAGNOSIS — R609 Edema, unspecified: Secondary | ICD-10-CM

## 2018-06-28 DIAGNOSIS — W1830XA Fall on same level, unspecified, initial encounter: Secondary | ICD-10-CM | POA: Diagnosis present

## 2018-06-28 DIAGNOSIS — M25551 Pain in right hip: Secondary | ICD-10-CM | POA: Diagnosis not present

## 2018-06-28 DIAGNOSIS — Z7901 Long term (current) use of anticoagulants: Secondary | ICD-10-CM | POA: Diagnosis not present

## 2018-06-28 DIAGNOSIS — S72142A Displaced intertrochanteric fracture of left femur, initial encounter for closed fracture: Principal | ICD-10-CM | POA: Diagnosis present

## 2018-06-28 DIAGNOSIS — Z86711 Personal history of pulmonary embolism: Secondary | ICD-10-CM

## 2018-06-28 DIAGNOSIS — R55 Syncope and collapse: Secondary | ICD-10-CM | POA: Diagnosis present

## 2018-06-28 DIAGNOSIS — I10 Essential (primary) hypertension: Secondary | ICD-10-CM | POA: Diagnosis present

## 2018-06-28 DIAGNOSIS — Z79899 Other long term (current) drug therapy: Secondary | ICD-10-CM

## 2018-06-28 DIAGNOSIS — D62 Acute posthemorrhagic anemia: Secondary | ICD-10-CM | POA: Diagnosis not present

## 2018-06-28 DIAGNOSIS — I82412 Acute embolism and thrombosis of left femoral vein: Secondary | ICD-10-CM | POA: Diagnosis not present

## 2018-06-28 DIAGNOSIS — S0990XA Unspecified injury of head, initial encounter: Secondary | ICD-10-CM | POA: Diagnosis not present

## 2018-06-28 DIAGNOSIS — M6281 Muscle weakness (generalized): Secondary | ICD-10-CM | POA: Diagnosis not present

## 2018-06-28 DIAGNOSIS — E890 Postprocedural hypothyroidism: Secondary | ICD-10-CM | POA: Diagnosis present

## 2018-06-28 DIAGNOSIS — F419 Anxiety disorder, unspecified: Secondary | ICD-10-CM | POA: Diagnosis not present

## 2018-06-28 DIAGNOSIS — Y92238 Other place in hospital as the place of occurrence of the external cause: Secondary | ICD-10-CM | POA: Diagnosis present

## 2018-06-28 DIAGNOSIS — S7292XA Unspecified fracture of left femur, initial encounter for closed fracture: Secondary | ICD-10-CM | POA: Diagnosis not present

## 2018-06-28 DIAGNOSIS — Z90710 Acquired absence of both cervix and uterus: Secondary | ICD-10-CM | POA: Diagnosis not present

## 2018-06-28 DIAGNOSIS — Z803 Family history of malignant neoplasm of breast: Secondary | ICD-10-CM

## 2018-06-28 DIAGNOSIS — Z7401 Bed confinement status: Secondary | ICD-10-CM | POA: Diagnosis not present

## 2018-06-28 DIAGNOSIS — S299XXA Unspecified injury of thorax, initial encounter: Secondary | ICD-10-CM | POA: Diagnosis not present

## 2018-06-28 DIAGNOSIS — Z7983 Long term (current) use of bisphosphonates: Secondary | ICD-10-CM | POA: Diagnosis not present

## 2018-06-28 DIAGNOSIS — D72829 Elevated white blood cell count, unspecified: Secondary | ICD-10-CM | POA: Diagnosis present

## 2018-06-28 DIAGNOSIS — Z419 Encounter for procedure for purposes other than remedying health state, unspecified: Secondary | ICD-10-CM

## 2018-06-28 DIAGNOSIS — S72142D Displaced intertrochanteric fracture of left femur, subsequent encounter for closed fracture with routine healing: Secondary | ICD-10-CM | POA: Diagnosis not present

## 2018-06-28 DIAGNOSIS — Z923 Personal history of irradiation: Secondary | ICD-10-CM | POA: Diagnosis not present

## 2018-06-28 DIAGNOSIS — Z853 Personal history of malignant neoplasm of breast: Secondary | ICD-10-CM

## 2018-06-28 DIAGNOSIS — Z79890 Hormone replacement therapy (postmenopausal): Secondary | ICD-10-CM | POA: Diagnosis not present

## 2018-06-28 DIAGNOSIS — S3289XA Fracture of other parts of pelvis, initial encounter for closed fracture: Secondary | ICD-10-CM | POA: Diagnosis not present

## 2018-06-28 DIAGNOSIS — E039 Hypothyroidism, unspecified: Secondary | ICD-10-CM | POA: Diagnosis not present

## 2018-06-28 DIAGNOSIS — R262 Difficulty in walking, not elsewhere classified: Secondary | ICD-10-CM | POA: Diagnosis not present

## 2018-06-28 DIAGNOSIS — Z9012 Acquired absence of left breast and nipple: Secondary | ICD-10-CM

## 2018-06-28 DIAGNOSIS — E785 Hyperlipidemia, unspecified: Secondary | ICD-10-CM | POA: Diagnosis not present

## 2018-06-28 DIAGNOSIS — Z9221 Personal history of antineoplastic chemotherapy: Secondary | ICD-10-CM | POA: Diagnosis not present

## 2018-06-28 DIAGNOSIS — S72002A Fracture of unspecified part of neck of left femur, initial encounter for closed fracture: Secondary | ICD-10-CM | POA: Diagnosis not present

## 2018-06-28 DIAGNOSIS — I2699 Other pulmonary embolism without acute cor pulmonale: Secondary | ICD-10-CM | POA: Diagnosis not present

## 2018-06-28 DIAGNOSIS — K5900 Constipation, unspecified: Secondary | ICD-10-CM | POA: Diagnosis not present

## 2018-06-28 DIAGNOSIS — S82192A Other fracture of upper end of left tibia, initial encounter for closed fracture: Secondary | ICD-10-CM | POA: Diagnosis not present

## 2018-06-28 DIAGNOSIS — Z86718 Personal history of other venous thrombosis and embolism: Secondary | ICD-10-CM

## 2018-06-28 DIAGNOSIS — W19XXXA Unspecified fall, initial encounter: Secondary | ICD-10-CM | POA: Diagnosis not present

## 2018-06-28 DIAGNOSIS — R000 Tachycardia, unspecified: Secondary | ICD-10-CM | POA: Diagnosis not present

## 2018-06-28 DIAGNOSIS — M1990 Unspecified osteoarthritis, unspecified site: Secondary | ICD-10-CM | POA: Diagnosis not present

## 2018-06-28 MED ORDER — MORPHINE SULFATE (PF) 2 MG/ML IV SOLN
2.0000 mg | Freq: Once | INTRAVENOUS | Status: AC
  Administered 2018-06-28: 2 mg via INTRAVENOUS
  Filled 2018-06-28: qty 1

## 2018-06-28 MED ORDER — ONDANSETRON HCL 4 MG/2ML IJ SOLN
4.0000 mg | Freq: Once | INTRAMUSCULAR | Status: AC
  Administered 2018-06-28: 4 mg via INTRAVENOUS
  Filled 2018-06-28: qty 2

## 2018-06-28 MED ORDER — MORPHINE SULFATE (PF) 4 MG/ML IV SOLN
INTRAVENOUS | Status: AC
  Administered 2018-06-29: 4 mg via INTRAVENOUS
  Filled 2018-06-28: qty 1

## 2018-06-28 NOTE — ED Provider Notes (Signed)
[facility] Emergency Department Provider Note    None    (approximate)  I have reviewed the triage vital signs and the nursing notes.   HISTORY  Chief Complaint Fall    HPI Debra Patel is a 77 y.o. female with below list of chronic medical conditions including recent left tibia fracture presents to the emergency department following fall with resultant 10 out of 10 left hip pain.  Patient states that she felt dizzy today and felt dizziness upon standing tonight followed by falling.  Patient denies any head injury.  Patient denies any nausea vomiting diarrhea constipation.  Patient denies any chest pain or shortness of breath.  Patient denies any headache   Past Medical History:  Diagnosis Date  . BP (high blood pressure) 08/21/2014  . Brain infection   . Breast cancer ([place]) 2001   Left side mastectomy  . Personal history of chemotherapy 2001   BREAST CA  . Personal history of radiation therapy 2001   BREAST CA  . Radiation 2001   BREAST CA  . S/P chemotherapy, time since greater than 12 weeks 2001   BREAST CA  . Tachycardia   . Thyroid disease   . Tick fever     Patient Active Problem List   Diagnosis Date Noted  . 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 Fever 03/04/2015  . Febrile 03/04/2015  . Chest pain 02/28/2015  . Anxiety 10/23/2014  . Amnesia 10/23/2014  . Mild cognitive disorder 10/23/2014  . BP (high blood pressure) 08/21/2014  . 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 Adult hypothyroidism 11/24/2013  . Awareness of heartbeats 11/24/2013  . Mass of pelvis 11/15/2013  . Bad memory 07/27/2012  . Breast cancer, left ([place]) 04/21/2000    Past Surgical History:  Procedure Laterality Date  . ABDOMINAL HYSTERECTOMY    . BREAST EXCISIONAL BIOPSY Right 2002   EXCISIONAL - NEG  . HERNIA REPAIR    . Mass Removed from Bladder and Both Ovaries    . MASTECTOMY Left 2001   BREAST CA  . THYROIDECTOMY      Prior to  Admission medications   Medication Sig Start Date End Date Taking? Authorizing Provider  acetaminophen (TYLENOL) 500 MG tablet Take 1,000 mg by mouth every 6 (six) hours as needed for mild pain.   Yes [provider]  alendronate (FOSAMAX) 70 MG tablet Take 70 mg by mouth once a week. Take with a full glass of water on an empty stomach.   Yes [provider]  Calcium Carbonate-Vitamin D 600-400 MG-UNIT tablet Take 1 tablet by mouth daily. 06/13/18  Yes [provider]  levothyroxine (SYNTHROID, LEVOTHROID) 50 MCG tablet Take 50 mcg by mouth every morning. (brand name only)   Yes [provider]  traMADol (ULTRAM) 50 MG tablet 1-2 tabs po q 8 hours prn Patient taking differently: Take 50-100 mg by mouth every 8 (eight) hours as needed for moderate pain or severe pain.  06/14/18  Yes Norval Gable, MD  apixaban (ELIQUIS) 5 MG TABS tablet Take 1 tablet (5 mg total) by mouth 2 (two) times daily. Patient not taking: Reported on 06/29/2018 03/12/15   Bettey Costa, MD  apixaban (ELIQUIS) 5 MG TABS tablet Take 2 tablets (10 mg total) by mouth 2 (two) times daily. 03/07/15 03/12/15  Bettey Costa, MD  ondansetron (ZOFRAN ODT) 8 MG disintegrating tablet Take 1 tablet (8 mg total) by mouth every 8 (eight) hours as needed. Patient not  taking: Reported on 06/29/2018 06/14/18   Norval Gable, MD    Allergies Pyridoxine; Hydrocodone; Bee venom; Motrin [ibuprofen]; Oxycodone; Latex; and Nickel  Family History  Problem Relation Age of Onset  . Breast cancer Mother 74    Social History Social History   Tobacco Use  . Smoking status: Never Smoker  . Smokeless tobacco: Never Used  Substance Use Topics  . Alcohol use: No  . Drug use: No    Review of Systems Constitutional: No fever/chills Eyes: No visual changes. ENT: No sore throat. Cardiovascular: Denies chest pain. Respiratory: Denies shortness of breath. Gastrointestinal: No abdominal pain.  No nausea, no vomiting.   No diarrhea.  No constipation. Genitourinary: Negative for dysuria. Musculoskeletal: Positive for left hip pain Integumentary: Negative for rash. Neurological: Negative for headaches, focal weakness or numbness.  Positive for dizziness   ____________________________________________   PHYSICAL EXAM:  VITAL SIGNS: ED Triage Vitals  Enc Vitals Group     BP 06/28/18 2256 114/83     Pulse Rate 06/28/18 2256 83     Resp 06/28/18 2256 18     Temp 06/28/18 2256 98.5 F (36.9 C)     Temp Source 06/28/18 2256 Oral     SpO2 06/28/18 2256 98 %     Weight 06/28/18 2257 64.4 kg (142 lb)     Height 06/28/18 2257 1.626 m (5\' 4" )     Head Circumference --      Peak Flow --      Pain Score 06/28/18 2257 9     Pain Loc --      Pain Edu? --      Excl. in [place]? --     Constitutional: Alert and oriented.  Apparent discomfort Eyes: Conjunctivae are normal. PERRL. EOMI. Head: Atraumatic. Mouth/Throat: Mucous membranes are moist.  Oropharynx non-erythematous. Neck: No stridor.  No meningeal signs.  No cervical spine tenderness to palpation. Cardiovascular: Normal rate, regular rhythm. Good peripheral circulation. Grossly normal heart sounds. Respiratory: Normal respiratory effort.  No retractions. Lungs CTAB. Gastrointestinal: Soft and nontender. No distention.  Musculoskeletal: Left hip pain with gentle palpation.  Left leg shortening comparison to right. Neurologic:  Normal speech and language. No gross focal neurologic deficits are appreciated.  Skin:  Skin is warm, dry and intact. No rash noted. Psychiatric: Mood and affect are normal. Speech and behavior are normal.  ____________________________________________   LABS (all labs ordered are listed, but only abnormal results are displayed)  Labs Reviewed - No data to display ____________________________________________  EKG  ED ECG REPORT I, [place], the attending physician, personally viewed and interpreted this ECG.    Date: 06/28/2018  EKG Time: 10:56 PM  Rate: 87  Rhythm: Normal sinus rhythm  Axis: Normal  Intervals: Normal  ST&T Change: None  ____________________________________________  RADIOLOGY I, [place] BROWN, personally viewed and evaluated these images (plain radiographs) as part of my medical decision making, as well as reviewing the written report by the radiologist.  ED MD interpretation: Erythematous lung changes no acute evidence of active pulmonary disease on chest x-ray.  Acute posttraumatic fractures of the left tibial metaphysis on left tib-fib x-ray  Acute posttraumatic left intertrochanteric hip fracture on left hip x-ray   Official radiology report(s): Dg Chest 1 View  Result Date: 06/29/2018 CLINICAL DATA:  Left hip pain after a fall today. History of breast cancer. EXAM: CHEST  1 VIEW COMPARISON:  03/04/2015 FINDINGS: Emphysematous changes in the lungs. Normal heart size and pulmonary vascularity. No focal  airspace disease or consolidation in the lungs. No blunting of costophrenic angles. No pneumothorax. Mediastinal contours appear intact. IMPRESSION: Emphysematous changes in the lungs. No evidence of active pulmonary disease. Electronically Signed   By: Lucienne Capers M.D.   On: 06/29/2018 00:40   Dg Tibia/fibula Left  Result Date: 06/29/2018 CLINICAL DATA:  Pain after a fall today. EXAM: LEFT TIBIA AND FIBULA - 2 VIEW COMPARISON:  None. FINDINGS: Patient positioning limits the examination. Acute probably comminuted fractures of the proximal left tibial metaphysis with some impaction of fracture fragments. No obvious extension to the knee joint. Obliquity on the AP view limits evaluation of the tibial plateau and femoral condylar regions. Degenerative changes in the knee. Fibula appears intact. IMPRESSION: Acute posttraumatic fractures of the proximal left tibial metaphysis. Patient positioning limits evaluation of the knee joint. Electronically Signed   By: Lucienne Capers M.D.   On: 06/29/2018 00:39   Ct Head Wo Contrast  Result Date: 06/28/2018 CLINICAL DATA:  Dizziness with resultant fall. EXAM: CT HEAD WITHOUT CONTRAST TECHNIQUE: Contiguous axial images were obtained from the base of the skull through the vertex without intravenous contrast. COMPARISON:  03/04/2015 FINDINGS: Brain: Stable mild superficial and central atrophy with chronic minimal small vessel ischemic disease of periventricular white matter. No intra-axial mass nor extra-axial fluid collections. Midline fourth ventricle and basal cisterns without effacement. Brainstem and cerebellum are unremarkable. No acute intracranial hemorrhage or large vascular territory infarct is identified. Vascular: No hyperdense vessels. Atherosclerosis of the carotid siphons. Skull: Negative for fracture or suspicious osseous lesions. Sinuses/Orbits: No acute finding. Other: None. IMPRESSION: Stable mild atrophy and chronic small vessel ischemia. No acute intracranial abnormality. Electronically Signed   By: Ashley Royalty M.D.   On: 06/28/2018 23:49   Dg Hip Unilat W Or Wo Pelvis 2-3 Views Left  Result Date: 06/29/2018 CLINICAL DATA:  Left hip pain after a fall today. Also fell 2 weeks ago. EXAM: DG HIP (WITH OR WITHOUT PELVIS) 2-3V LEFT COMPARISON:  07/26/2016 FINDINGS: Acute inter trochanteric fracture of the proximal left femur without significant displacement. No dislocation of the left hip. Pelvis appears intact. SI joints and symphysis pubis are not displaced. Degenerative changes in the lower lumbar spine and in both hips. Soft tissues are unremarkable. IMPRESSION: Acute posttraumatic intertrochanteric fracture of the proximal left femur. Electronically Signed   By: Lucienne Capers M.D.   On: 06/29/2018 00:38    ____________________________________________   Procedures   ____________________________________________   INITIAL IMPRESSION / ASSESSMENT AND PLAN / ED COURSE  As part of my medical decision  making, I reviewed the following data within the electronic MEDICAL RECORD NUMBER   77 year old female presenting with above-stated history and physical exam following fall which was preceded by dizziness.  Start for possible left hip fracture and as such x-ray was performed which revealed a left intertrochanteric hip fracture.  Patient with a known proximal tibial fracture which was noted on x-ray.  Patient given IV fentanyl via EMS current pain score 9 out of 10 in the such patient given IV morphine with some improvement of pain.  Patient discussed with Dr. Harlow Mares as she has been evaluated by Dr. Christia Reading for tibial fracture.  Patient also discussed with Dr. Jannifer Franklin for hospital admission further evaluation and management secondary to dizziness and left hip fracture. ____________________________________________  FINAL CLINICAL IMPRESSION(S) / ED DIAGNOSES  Final diagnoses:  Closed displaced intertrochanteric fracture of left femur, initial encounter ([place])     MEDICATIONS GIVEN DURING THIS VISIT:  Medications  morphine 2 MG/ML injection 2 mg (2 mg Intravenous Given 06/28/18 2321)  ondansetron (ZOFRAN) injection 4 mg (4 mg Intravenous Given 06/28/18 2321)  morphine 4 MG/ML injection 4 mg (4 mg Intravenous Given 06/29/18 0014)  morphine 2 MG/ML injection 2 mg (2 mg Intravenous Given 06/29/18 0138)     ED Discharge Orders    None       Note:  This document was prepared using Dragon voice recognition software and may include unintentional dictation errors.    Gregor Hams, MD 06/29/18 [phone]

## 2018-06-28 NOTE — ED Triage Notes (Signed)
Pt arrived via [facility] from home with c/o fall. EMS states that pt has a hx of falling and fracturing left tibia about two weeks ago. EMS states the pt went to put some clothes down and felt dizzy and fell on the ground. EMS states pt now with left pelvic pain. EMS gave 100 mcg of fentanyl; 50 mcg in house, 50 mcg en route.

## 2018-06-28 NOTE — ED Notes (Signed)
Patient transported to CT 

## 2018-06-29 ENCOUNTER — Encounter: Payer: Self-pay | Admitting: Anesthesiology

## 2018-06-29 ENCOUNTER — Inpatient Hospital Stay
Admit: 2018-06-29 | Discharge: 2018-06-29 | Disposition: A | Discharge status: Home / Self Care | Payer: PPO | Attending: Orthopedic Surgery | Admitting: Orthopedic Surgery

## 2018-06-29 ENCOUNTER — Inpatient Hospital Stay: Payer: PPO

## 2018-06-29 ENCOUNTER — Inpatient Hospital Stay: Payer: PPO | Admitting: Anesthesiology

## 2018-06-29 ENCOUNTER — Encounter
Admission: EM | Disposition: A | Discharge status: Skilled Nursing Facility | Payer: Self-pay | Source: Home / Self Care | Attending: Internal Medicine

## 2018-06-29 DIAGNOSIS — Z79899 Other long term (current) drug therapy: Secondary | ICD-10-CM | POA: Diagnosis not present

## 2018-06-29 DIAGNOSIS — E890 Postprocedural hypothyroidism: Secondary | ICD-10-CM | POA: Diagnosis present

## 2018-06-29 DIAGNOSIS — Z7983 Long term (current) use of bisphosphonates: Secondary | ICD-10-CM | POA: Diagnosis not present

## 2018-06-29 DIAGNOSIS — Z79890 Hormone replacement therapy (postmenopausal): Secondary | ICD-10-CM | POA: Diagnosis not present

## 2018-06-29 DIAGNOSIS — S82142A Displaced bicondylar fracture of left tibia, initial encounter for closed fracture: Secondary | ICD-10-CM | POA: Diagnosis present

## 2018-06-29 DIAGNOSIS — Z7901 Long term (current) use of anticoagulants: Secondary | ICD-10-CM | POA: Diagnosis not present

## 2018-06-29 DIAGNOSIS — Z86718 Personal history of other venous thrombosis and embolism: Secondary | ICD-10-CM | POA: Diagnosis not present

## 2018-06-29 DIAGNOSIS — D72829 Elevated white blood cell count, unspecified: Secondary | ICD-10-CM | POA: Diagnosis present

## 2018-06-29 DIAGNOSIS — Z90710 Acquired absence of both cervix and uterus: Secondary | ICD-10-CM | POA: Diagnosis not present

## 2018-06-29 DIAGNOSIS — D62 Acute posthemorrhagic anemia: Secondary | ICD-10-CM | POA: Diagnosis not present

## 2018-06-29 DIAGNOSIS — Z9012 Acquired absence of left breast and nipple: Secondary | ICD-10-CM | POA: Diagnosis not present

## 2018-06-29 DIAGNOSIS — R55 Syncope and collapse: Secondary | ICD-10-CM | POA: Diagnosis present

## 2018-06-29 DIAGNOSIS — Z86711 Personal history of pulmonary embolism: Secondary | ICD-10-CM | POA: Diagnosis not present

## 2018-06-29 DIAGNOSIS — I10 Essential (primary) hypertension: Secondary | ICD-10-CM | POA: Diagnosis present

## 2018-06-29 DIAGNOSIS — Z853 Personal history of malignant neoplasm of breast: Secondary | ICD-10-CM | POA: Diagnosis not present

## 2018-06-29 DIAGNOSIS — W1830XA Fall on same level, unspecified, initial encounter: Secondary | ICD-10-CM | POA: Diagnosis present

## 2018-06-29 DIAGNOSIS — Z923 Personal history of irradiation: Secondary | ICD-10-CM | POA: Diagnosis not present

## 2018-06-29 DIAGNOSIS — Z9221 Personal history of antineoplastic chemotherapy: Secondary | ICD-10-CM | POA: Diagnosis not present

## 2018-06-29 DIAGNOSIS — Z803 Family history of malignant neoplasm of breast: Secondary | ICD-10-CM | POA: Diagnosis not present

## 2018-06-29 DIAGNOSIS — S72142A Displaced intertrochanteric fracture of left femur, initial encounter for closed fracture: Secondary | ICD-10-CM | POA: Diagnosis present

## 2018-06-29 DIAGNOSIS — Y92238 Other place in hospital as the place of occurrence of the external cause: Secondary | ICD-10-CM | POA: Diagnosis present

## 2018-06-29 HISTORY — PX: INTRAMEDULLARY (IM) NAIL INTERTROCHANTERIC: SHX5875

## 2018-06-29 LAB — HEMOGLOBIN A1C
Hgb A1c MFr Bld: 5.5 % (ref 4.8–5.6)
Mean Plasma Glucose: 111.15 mg/dL

## 2018-06-29 LAB — CBC
HCT: 42.3 % (ref 36.0–46.0)
Hemoglobin: 14.3 g/dL (ref 12.0–15.0)
MCH: 28.9 pg (ref 26.0–34.0)
MCHC: 33.8 g/dL (ref 30.0–36.0)
MCV: 85.6 fL (ref 80.0–100.0)
PLATELETS: 255 10*3/uL (ref 150–400)
RBC: 4.94 MIL/uL (ref 3.87–5.11)
RDW: 14.5 % (ref 11.5–15.5)
WBC: 13.4 10*3/uL — ABNORMAL HIGH (ref 4.0–10.5)
nRBC: 0 % (ref 0.0–0.2)

## 2018-06-29 LAB — COMPREHENSIVE METABOLIC PANEL
ALK PHOS: 71 U/L (ref 38–126)
ALT: 17 U/L (ref 0–44)
ANION GAP: 11 (ref 5–15)
AST: 23 U/L (ref 15–41)
Albumin: 4.1 g/dL (ref 3.5–5.0)
BUN: 23 mg/dL (ref 8–23)
CALCIUM: 9.4 mg/dL (ref 8.9–10.3)
CHLORIDE: 104 mmol/L (ref 98–111)
CO2: 23 mmol/L (ref 22–32)
Creatinine, Ser: 0.77 mg/dL (ref 0.44–1.00)
GFR calc non Af Amer: >60 mL/min (ref >60)
Glucose, Bld: 176 mg/dL — ABNORMAL HIGH (ref 70–99)
Potassium: 3.7 mmol/L (ref 3.5–5.1)
SODIUM: 138 mmol/L (ref 135–145)
Total Bilirubin: 0.6 mg/dL (ref 0.3–1.2)
Total Protein: 7.1 g/dL (ref 6.5–8.1)

## 2018-06-29 LAB — SURGICAL PCR SCREEN
MRSA, PCR: NEGATIVE
Staphylococcus aureus: NEGATIVE

## 2018-06-29 LAB — TSH: TSH: 4.278 u[IU]/mL (ref 0.350–4.500)

## 2018-06-29 LAB — TROPONIN I: Troponin I: <0.03 ng/mL (ref <0.03)

## 2018-06-29 SURGERY — FIXATION, FRACTURE, INTERTROCHANTERIC, WITH INTRAMEDULLARY ROD
Anesthesia: Spinal | Laterality: Left

## 2018-06-29 MED ORDER — ONDANSETRON HCL 4 MG PO TABS: 4.0000 mg | ORAL_TABLET | Freq: Four times a day (QID) | ORAL | Status: DC | PRN

## 2018-06-29 MED ORDER — PROPOFOL 500 MG/50ML IV EMUL
INTRAVENOUS | Status: AC
  Filled 2018-06-29: qty 50

## 2018-06-29 MED ORDER — CEFAZOLIN SODIUM-DEXTROSE 2-4 GM/100ML-% IV SOLN
INTRAVENOUS | Status: AC
  Filled 2018-06-29: qty 100

## 2018-06-29 MED ORDER — SODIUM CHLORIDE FLUSH 0.9 % IV SOLN
INTRAVENOUS | Status: AC
  Filled 2018-06-29: qty 10

## 2018-06-29 MED ORDER — ENOXAPARIN SODIUM 40 MG/0.4ML ~~LOC~~ SOLN
40.0000 mg | SUBCUTANEOUS | Status: DC
  Administered 2018-06-30: 40 mg via SUBCUTANEOUS
  Filled 2018-06-29 (×2): qty 0.4

## 2018-06-29 MED ORDER — FENTANYL CITRATE (PF) 100 MCG/2ML IJ SOLN
INTRAMUSCULAR | Status: AC
  Filled 2018-06-29: qty 2

## 2018-06-29 MED ORDER — LEVOTHYROXINE SODIUM 50 MCG PO TABS
50.0000 ug | ORAL_TABLET | Freq: Every day | ORAL | Status: DC
  Administered 2018-06-29 – 2018-07-01 (×3): 50 ug via ORAL
  Filled 2018-06-29 (×3): qty 1

## 2018-06-29 MED ORDER — ONDANSETRON HCL 4 MG/2ML IJ SOLN
INTRAMUSCULAR | Status: AC
  Filled 2018-06-29: qty 2

## 2018-06-29 MED ORDER — ACETAMINOPHEN 650 MG RE SUPP: 650.0000 mg | Freq: Four times a day (QID) | RECTAL | Status: DC | PRN

## 2018-06-29 MED ORDER — CALCIUM CARBONATE-VITAMIN D 500-200 MG-UNIT PO TABS
1.0000 | ORAL_TABLET | Freq: Every day | ORAL | Status: DC
  Administered 2018-06-30 – 2018-07-01 (×2): 1 via ORAL
  Filled 2018-06-29 (×2): qty 1

## 2018-06-29 MED ORDER — CYCLOBENZAPRINE HCL 10 MG PO TABS
10.0000 mg | ORAL_TABLET | Freq: Three times a day (TID) | ORAL | Status: DC | PRN
  Administered 2018-06-29 – 2018-06-30 (×4): 10 mg via ORAL
  Filled 2018-06-29 (×4): qty 1

## 2018-06-29 MED ORDER — MORPHINE SULFATE (PF) 4 MG/ML IV SOLN
4.0000 mg | Freq: Once | INTRAVENOUS | Status: AC
  Administered 2018-06-29: 4 mg via INTRAVENOUS
  Filled 2018-06-29: qty 1

## 2018-06-29 MED ORDER — ONDANSETRON HCL 4 MG/2ML IJ SOLN: 4.0000 mg | Freq: Four times a day (QID) | INTRAMUSCULAR | Status: DC | PRN

## 2018-06-29 MED ORDER — SODIUM CHLORIDE 0.9 % IV SOLN
INTRAVENOUS | Status: DC | PRN
  Administered 2018-06-29: 30 ug/min via INTRAVENOUS

## 2018-06-29 MED ORDER — SODIUM CHLORIDE 0.9 % IR SOLN
Status: DC | PRN
  Administered 2018-06-29: 1000 mL

## 2018-06-29 MED ORDER — LACTATED RINGERS IV SOLN: INTRAVENOUS | Status: DC

## 2018-06-29 MED ORDER — EPHEDRINE SULFATE 50 MG/ML IJ SOLN
INTRAMUSCULAR | Status: DC | PRN
  Administered 2018-06-29: 5 mg via INTRAVENOUS

## 2018-06-29 MED ORDER — LACTATED RINGERS IV SOLN
INTRAVENOUS | Status: DC | PRN
  Administered 2018-06-29: 12:00:00 via INTRAVENOUS

## 2018-06-29 MED ORDER — PROPOFOL 10 MG/ML IV BOLUS
INTRAVENOUS | Status: DC | PRN
  Administered 2018-06-29: 10 mg via INTRAVENOUS
  Administered 2018-06-29: 20 mg via INTRAVENOUS
  Administered 2018-06-29: 10 mg via INTRAVENOUS

## 2018-06-29 MED ORDER — PHENYLEPHRINE HCL 10 MG/ML IJ SOLN
INTRAMUSCULAR | Status: DC | PRN
  Administered 2018-06-29 (×7): 100 ug via INTRAVENOUS

## 2018-06-29 MED ORDER — FENTANYL CITRATE (PF) 100 MCG/2ML IJ SOLN: 25.0000 ug | INTRAMUSCULAR | Status: DC | PRN

## 2018-06-29 MED ORDER — ONDANSETRON HCL 4 MG/2ML IJ SOLN
4.0000 mg | Freq: Four times a day (QID) | INTRAMUSCULAR | Status: DC | PRN
  Administered 2018-06-30: 4 mg via INTRAVENOUS
  Filled 2018-06-29: qty 2

## 2018-06-29 MED ORDER — DOCUSATE SODIUM 100 MG PO CAPS
100.0000 mg | ORAL_CAPSULE | Freq: Two times a day (BID) | ORAL | Status: DC
  Administered 2018-06-29 – 2018-07-01 (×4): 100 mg via ORAL
  Filled 2018-06-29 (×4): qty 1

## 2018-06-29 MED ORDER — LIDOCAINE HCL (PF) 2 % IJ SOLN
INTRAMUSCULAR | Status: AC
  Filled 2018-06-29: qty 10

## 2018-06-29 MED ORDER — MIDAZOLAM HCL 2 MG/2ML IJ SOLN
INTRAMUSCULAR | Status: AC
  Filled 2018-06-29: qty 2

## 2018-06-29 MED ORDER — LIDOCAINE HCL (CARDIAC) PF 100 MG/5ML IV SOSY
PREFILLED_SYRINGE | INTRAVENOUS | Status: DC | PRN
  Administered 2018-06-29: 40 mg via INTRAVENOUS

## 2018-06-29 MED ORDER — ONDANSETRON HCL 4 MG/2ML IJ SOLN
INTRAMUSCULAR | Status: DC | PRN
  Administered 2018-06-29: 4 mg via INTRAVENOUS

## 2018-06-29 MED ORDER — CEFAZOLIN SODIUM-DEXTROSE 1-4 GM/50ML-% IV SOLN
1.0000 g | Freq: Four times a day (QID) | INTRAVENOUS | Status: AC
  Administered 2018-06-29 – 2018-06-30 (×3): 1 g via INTRAVENOUS
  Filled 2018-06-29 (×3): qty 50

## 2018-06-29 MED ORDER — ONDANSETRON HCL 4 MG/2ML IJ SOLN: 4.0000 mg | Freq: Once | INTRAMUSCULAR | Status: DC | PRN

## 2018-06-29 MED ORDER — MORPHINE SULFATE (PF) 4 MG/ML IV SOLN: 4.0000 mg | INTRAVENOUS | Status: DC | PRN

## 2018-06-29 MED ORDER — METOCLOPRAMIDE HCL 5 MG/ML IJ SOLN: 5.0000 mg | Freq: Three times a day (TID) | INTRAMUSCULAR | Status: DC | PRN

## 2018-06-29 MED ORDER — MORPHINE SULFATE (PF) 2 MG/ML IV SOLN
2.0000 mg | Freq: Once | INTRAVENOUS | Status: AC
  Administered 2018-06-29: 2 mg via INTRAVENOUS

## 2018-06-29 MED ORDER — DOCUSATE SODIUM 100 MG PO CAPS: 100.0000 mg | ORAL_CAPSULE | Freq: Two times a day (BID) | ORAL | Status: DC

## 2018-06-29 MED ORDER — ALENDRONATE SODIUM 70 MG PO TABS: 70.0000 mg | ORAL_TABLET | ORAL | Status: DC

## 2018-06-29 MED ORDER — OXYCODONE HCL 5 MG PO TABS
5.0000 mg | ORAL_TABLET | Freq: Four times a day (QID) | ORAL | Status: DC | PRN
  Administered 2018-06-29 – 2018-07-01 (×7): 5 mg via ORAL
  Filled 2018-06-29 (×7): qty 1

## 2018-06-29 MED ORDER — MORPHINE SULFATE (PF) 4 MG/ML IV SOLN
4.0000 mg | Freq: Once | INTRAVENOUS | Status: AC
  Administered 2018-06-29: 4 mg via INTRAVENOUS

## 2018-06-29 MED ORDER — FENTANYL CITRATE (PF) 100 MCG/2ML IJ SOLN
INTRAMUSCULAR | Status: DC | PRN
  Administered 2018-06-29: 50 ug via INTRAVENOUS

## 2018-06-29 MED ORDER — ACETAMINOPHEN 325 MG PO TABS
650.0000 mg | ORAL_TABLET | Freq: Four times a day (QID) | ORAL | Status: DC | PRN
  Administered 2018-06-29: 650 mg via ORAL
  Filled 2018-06-29: qty 2

## 2018-06-29 MED ORDER — MORPHINE SULFATE (PF) 2 MG/ML IV SOLN: 2.0000 mg | INTRAVENOUS | Status: DC | PRN

## 2018-06-29 MED ORDER — MORPHINE SULFATE (PF) 2 MG/ML IV SOLN
INTRAVENOUS | Status: AC
  Administered 2018-06-29: 2 mg via INTRAVENOUS
  Filled 2018-06-29: qty 1

## 2018-06-29 MED ORDER — METOCLOPRAMIDE HCL 10 MG PO TABS: 5.0000 mg | ORAL_TABLET | Freq: Three times a day (TID) | ORAL | Status: DC | PRN

## 2018-06-29 MED ORDER — CEFAZOLIN SODIUM-DEXTROSE 2-4 GM/100ML-% IV SOLN
2.0000 g | INTRAVENOUS | Status: AC
  Administered 2018-06-29: 2 g via INTRAVENOUS
  Filled 2018-06-29 (×2): qty 100

## 2018-06-29 MED ORDER — SODIUM CHLORIDE 0.9 % IV BOLUS
250.0000 mL | Freq: Once | INTRAVENOUS | Status: AC
  Administered 2018-06-29: 250 mL via INTRAVENOUS

## 2018-06-29 MED ORDER — PROPOFOL 500 MG/50ML IV EMUL
INTRAVENOUS | Status: DC | PRN
  Administered 2018-06-29: 50 ug/kg/min via INTRAVENOUS

## 2018-06-29 MED ORDER — MIDAZOLAM HCL 5 MG/5ML IJ SOLN
INTRAMUSCULAR | Status: DC | PRN
  Administered 2018-06-29: 1 mg via INTRAVENOUS

## 2018-06-29 SURGICAL SUPPLY — 37 items
BLADE IM NL HLCL 110X10.35X (Anchor) IMPLANT
BLADE TFNA HELICAL 110 (Anchor) ×1 IMPLANT
BNDG COHESIVE 4X5 TAN STRL (GAUZE/BANDAGES/DRESSINGS) IMPLANT
BRUSH SCRUB EZ  4% CHG (MISCELLANEOUS) ×2
BRUSH SCRUB EZ 4% CHG (MISCELLANEOUS) ×2 IMPLANT
CANISTER SUCT 1200ML W/VALVE (MISCELLANEOUS) ×2 IMPLANT
CHLORAPREP W/TINT 26ML (MISCELLANEOUS) ×2 IMPLANT
COVER WAND RF STERILE (DRAPES) ×2 IMPLANT
DRAPE SHEET LG 3/4 BI-LAMINATE (DRAPES) ×2 IMPLANT
DRAPE U-SHAPE 47X51 STRL (DRAPES) ×2 IMPLANT
DRSG OPSITE POSTOP 3X4 (GAUZE/BANDAGES/DRESSINGS) ×2 IMPLANT
DRSG OPSITE POSTOP 4X6 (GAUZE/BANDAGES/DRESSINGS) IMPLANT
DRSG OPSITE POSTOP 4X8 (GAUZE/BANDAGES/DRESSINGS) IMPLANT
ELECT REM PT RETURN 9FT ADLT (ELECTROSURGICAL) ×2
ELECTRODE REM PT RTRN 9FT ADLT (ELECTROSURGICAL) ×1 IMPLANT
GAUZE PETRO XEROFOAM 1X8 (MISCELLANEOUS) ×2 IMPLANT
GLOVE INDICATOR 8.0 STRL GRN (GLOVE) ×2 IMPLANT
GLOVE PROTEXIS LATEX SZ 7.5 (GLOVE) ×4 IMPLANT
GLOVE SURG LATEX 7.5 PF (GLOVE) IMPLANT
GOWN STRL REUS W/ TWL LRG LVL3 (GOWN DISPOSABLE) ×1 IMPLANT
GOWN STRL REUS W/ TWL XL LVL3 (GOWN DISPOSABLE) ×1 IMPLANT
GOWN STRL REUS W/TWL LRG LVL3 (GOWN DISPOSABLE) ×1
GOWN STRL REUS W/TWL XL LVL3 (GOWN DISPOSABLE) ×1
GUIDEWIRE 3.2X400 (WIRE) ×2 IMPLANT
KIT PATIENT CARE HANA TABLE (KITS) ×2 IMPLANT
KIT TURNOVER CYSTO (KITS) ×2 IMPLANT
MAT ABSORB  FLUID 56X50 GRAY (MISCELLANEOUS) ×1
MAT ABSORB FLUID 56X50 GRAY (MISCELLANEOUS) ×1 IMPLANT
NAIL CANN TFNA 9 130D 380 (Nail) ×1 IMPLANT
NS IRRIG 1000ML POUR BTL (IV SOLUTION) ×2 IMPLANT
PACK HIP COMPR (MISCELLANEOUS) ×2 IMPLANT
REAMER ROD DEEP FLUTE 2.5X950 (INSTRUMENTS) ×1 IMPLANT
STAPLER SKIN PROX 35W (STAPLE) ×2 IMPLANT
SUT VIC AB 0 CT1 36 (SUTURE) ×2 IMPLANT
SUT VIC AB 2-0 CT1 27 (SUTURE) ×1
SUT VIC AB 2-0 CT1 TAPERPNT 27 (SUTURE) ×1 IMPLANT
TOWEL OR 17X26 4PK STRL BLUE (TOWEL DISPOSABLE) ×2 IMPLANT

## 2018-06-29 NOTE — Anesthesia Preprocedure Evaluation (Signed)
Anesthesia Evaluation  Patient identified by MRN, date of birth, ID band Patient awake    Reviewed: Allergy & Precautions, NPO status , Patient's Chart, lab work & pertinent test results, reviewed documented beta blocker date and time   Airway Mallampati: II  TM Distance: >3 FB     Dental  (+) Chipped   Pulmonary           Cardiovascular hypertension, Pt. on medications      Neuro/Psych Anxiety    GI/Hepatic   Endo/Other  Hypothyroidism   Renal/GU      Musculoskeletal  (+) Arthritis ,   Abdominal   Peds  Hematology   Anesthesia Other Findings Frequent PVCs. EF 50. EKG ok. Dr. Ubaldo Glassing oks.  Reproductive/Obstetrics                             Anesthesia Physical Anesthesia Plan  ASA: III  Anesthesia Plan: Spinal   Post-op Pain Management:    Induction:   PONV Risk Score and Plan:   Airway Management Planned:   Additional Equipment:   Intra-op Plan:   Post-operative Plan:   Informed Consent: I have reviewed the patients History and Physical, chart, labs and discussed the procedure including the risks, benefits and alternatives for the proposed anesthesia with the patient or authorized representative who has indicated his/her understanding and acceptance.     Plan Discussed with: CRNA  Anesthesia Plan Comments:         Anesthesia Quick Evaluation

## 2018-06-29 NOTE — Progress Notes (Signed)
Notified MD of BP. New orders for 250 NS bolus of one hour. Orders placed and bolus started.

## 2018-06-29 NOTE — Progress Notes (Signed)
[place] at [facility]                                                                                                                                                                                  Patient Demographics   Debra Patel, is a 77 y.o. female, DOB - April 09, 1941, [place] date - 06/28/2018   Admitting Physician Harrie Foreman, MD  Outpatient Primary MD for the patient is Cicale, Jim Like, NP   LOS - 0  Subjective: Patient may have had syncope she is not sure what happened.  Currently asymptomatic. Second fall recently. Complains of hip pain.  Review of Systems:   CONSTITUTIONAL: No documented fever. No fatigue, weakness. No weight gain, no weight loss.  EYES: No blurry or double vision.  ENT: No tinnitus. No postnasal drip. No redness of the oropharynx.  RESPIRATORY: No cough, no wheeze, no hemoptysis. No dyspnea.  CARDIOVASCULAR: No chest pain. No orthopnea. No palpitations. No syncope.  GASTROINTESTINAL: No nausea, no vomiting or diarrhea. No abdominal pain. No melena or hematochezia.  GENITOURINARY: No dysuria or hematuria.  ENDOCRINE: No polyuria or nocturia. No heat or cold intolerance.  HEMATOLOGY: No anemia. No bruising. No bleeding.  INTEGUMENTARY: No rashes. No lesions.  MUSCULOSKELETAL: No arthritis. No swelling. No gout.  Positive hip pain NEUROLOGIC: No numbness, tingling, or ataxia. No seizure-type activity.  PSYCHIATRIC: No anxiety. No insomnia. No ADD.    Vitals:   Vitals:   06/29/18 0300 06/29/18 0404 06/29/18 0738 06/29/18 1142  BP: 131/76 (!) 148/71 (!) 109/58 119/70  Pulse: (!) 101 87 81 94  Resp: (!) 22 14 18 17   Temp:  98 F (36.7 C) 98.4 F (36.9 C) 99.4 F (37.4 C)  TempSrc:  Oral Oral Tympanic  SpO2: 98% 100% 98% 100%  Weight:    67.9 kg  Height:    5\' 4"  (1.626 m)    Wt Readings from Last 3 Encounters:  06/29/18 67.9 kg  06/14/18 64 kg  10/04/17 67.6 kg     Intake/Output  Summary (Last 24 hours) at 06/29/2018 1347 Last data filed at 06/29/2018 1344 Gross per 24 hour  Intake -  Output 50 ml  Net -50 ml    Physical Exam:   GENERAL: Pleasant-appearing in no apparent distress.  HEAD, EYES, EARS, NOSE AND THROAT: Atraumatic, normocephalic. Extraocular muscles are intact. Pupils equal and reactive to light. Sclerae anicteric. No conjunctival injection. No oro-pharyngeal erythema.  NECK: Supple. There is no jugular venous distention. No bruits, no lymphadenopathy, no thyromegaly.  HEART: Regular rate and rhythm,. No murmurs, no rubs, no clicks.  LUNGS: Clear to auscultation bilaterally. No rales or  rhonchi. No wheezes.  ABDOMEN: Soft, flat, nontender, nondistended. Has good bowel sounds. No hepatosplenomegaly appreciated.  EXTREMITIES: No evidence of any cyanosis, clubbing, or peripheral edema.  +2 pedal and radial pulses bilaterally.  NEUROLOGIC: The patient is alert, awake, and oriented x3 with no focal motor or sensory deficits appreciated bilaterally.  SKIN: Moist and warm with no rashes appreciated.  Psych: Not anxious, depressed LN: No inguinal LN enlargement    Antibiotics   Anti-infectives (From admission, onward)   Start     Dose/Rate Route Frequency Ordered Stop   06/29/18 1325  50,000 units bacitracin in 0.9% normal saline 250 mL irrigation  Status:  Discontinued       As needed 06/29/18 1325 06/29/18 1345   06/29/18 1213  ceFAZolin (ANCEF) 2-4 GM/100ML-% IVPB    Note to Pharmacy:  Milinda Cave   : cabinet override      06/29/18 1213 06/29/18 1230   06/29/18 1028  [MAR Hold]  ceFAZolin (ANCEF) IVPB 2g/100 mL premix     (MAR Hold since Wed 06/29/2018 at 1138. Reason: Transfer to a Procedural area.)   2 g 200 mL/hr over 30 Minutes Intravenous 30 min pre-op 06/29/18 1028 06/29/18 1230      Medications   Scheduled Meds: . [MAR Hold] calcium-vitamin D  1 tablet Oral Daily  . [MAR Hold] docusate sodium  100 mg Oral BID  . [MAR Hold]  levothyroxine  50 mcg Oral QAC breakfast  . sodium chloride flush       Continuous Infusions: PRN Meds:.[MAR Hold] acetaminophen **OR** [MAR Hold] acetaminophen, [MAR Hold]  morphine injection, [MAR Hold]  morphine injection, [MAR Hold] ondansetron **OR** [MAR Hold] ondansetron (ZOFRAN) IV, [MAR Hold] oxyCODONE   Data Review:   Micro Results Recent Results (from the past 240 hour(s))  Surgical pcr screen     Status: None   Collection Time: 06/29/18  4:29 AM  Result Value Ref Range Status   MRSA, PCR NEGATIVE NEGATIVE Final   Staphylococcus aureus NEGATIVE NEGATIVE Final    Comment: (NOTE) The Xpert SA Assay (FDA approved for NASAL specimens in patients 39 years of age and older), is one component of a comprehensive surveillance program. It is not intended to diagnose infection nor to guide or monitor treatment. Performed at [facility], [address], [place], [place] [id number]     Radiology Reports Dg Chest 1 View  Result Date: 06/29/2018 CLINICAL DATA:  Left hip pain after a fall today. History of breast cancer. EXAM: CHEST  1 VIEW COMPARISON:  03/04/2015 FINDINGS: Emphysematous changes in the lungs. Normal heart size and pulmonary vascularity. No focal airspace disease or consolidation in the lungs. No blunting of costophrenic angles. No pneumothorax. Mediastinal contours appear intact. IMPRESSION: Emphysematous changes in the lungs. No evidence of active pulmonary disease. Electronically Signed   By: Lucienne Capers M.D.   On: 06/29/2018 00:40   Dg Tibia/fibula Left  Result Date: 06/29/2018 CLINICAL DATA:  Pain after a fall today. EXAM: LEFT TIBIA AND FIBULA - 2 VIEW COMPARISON:  None. FINDINGS: Patient positioning limits the examination. Acute probably comminuted fractures of the proximal left tibial metaphysis with some impaction of fracture fragments. No obvious extension to the knee joint. Obliquity on the AP view limits evaluation of the tibial plateau and  femoral condylar regions. Degenerative changes in the knee. Fibula appears intact. IMPRESSION: Acute posttraumatic fractures of the proximal left tibial metaphysis. Patient positioning limits evaluation of the knee joint. Electronically Signed   By: Gwyndolyn Saxon  Gerilyn Nestle M.D.   On: 06/29/2018 00:39   Dg Tibia/fibula Left  Result Date: 06/14/2018 CLINICAL DATA:  Left knee pain after fall. EXAM: LEFT TIBIA AND FIBULA - 2 VIEW COMPARISON:  None. FINDINGS: The fibula appears normal. No soft tissue abnormality is noted. Mildly displaced longitudinal fracture is seen involving the medial portion of the proximal left tibia; intra-articular extension cannot be excluded. IMPRESSION: Mildly displaced longitudinal fracture seen involving the medial portion of proximal left tibia. CT scan of the left knee is recommended to evaluate for intra-articular extension. Electronically Signed   By: Marijo Conception, M.D.   On: 06/14/2018 20:23   Ct Head Wo Contrast  Result Date: 06/28/2018 CLINICAL DATA:  Dizziness with resultant fall. EXAM: CT HEAD WITHOUT CONTRAST TECHNIQUE: Contiguous axial images were obtained from the base of the skull through the vertex without intravenous contrast. COMPARISON:  03/04/2015 FINDINGS: Brain: Stable mild superficial and central atrophy with chronic minimal small vessel ischemic disease of periventricular white matter. No intra-axial mass nor extra-axial fluid collections. Midline fourth ventricle and basal cisterns without effacement. Brainstem and cerebellum are unremarkable. No acute intracranial hemorrhage or large vascular territory infarct is identified. Vascular: No hyperdense vessels. Atherosclerosis of the carotid siphons. Skull: Negative for fracture or suspicious osseous lesions. Sinuses/Orbits: No acute finding. Other: None. IMPRESSION: Stable mild atrophy and chronic small vessel ischemia. No acute intracranial abnormality. Electronically Signed   By: Ashley Royalty M.D.   On: 06/28/2018  23:49   Dg Knee Complete 4 Views Left  Result Date: 06/14/2018 CLINICAL DATA:  Fall with left knee pain and swelling EXAM: LEFT KNEE - COMPLETE 4+ VIEW COMPARISON:  None. FINDINGS: Large suprapatellar lipohemarthrosis. Medial tibial plateau vertical fracture with 3 mm medial displacement of the dominant fracture fragment. No dislocation. No suspicious focal osseous lesion. 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 No radiopaque foreign body. IMPRESSION: 1. Medial tibial plateau fracture with mild displacement. 2. Large suprapatellar lipohemarthrosis. 3. 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 Electronically Signed   By: Ilona Sorrel M.D.   On: 06/14/2018 20:23   Dg Hip Unilat W Or Wo Pelvis 2-3 Views Left  Result Date: 06/29/2018 CLINICAL DATA:  Left hip pain after a fall today. Also fell 2 weeks ago. EXAM: DG HIP (WITH OR WITHOUT PELVIS) 2-3V LEFT COMPARISON:  07/26/2016 FINDINGS: Acute inter trochanteric fracture of the proximal left femur without significant displacement. No dislocation of the left hip. Pelvis appears intact. SI joints and symphysis pubis are not displaced. Degenerative changes in the lower lumbar spine and in both hips. Soft tissues are unremarkable. IMPRESSION: Acute posttraumatic intertrochanteric fracture of the proximal left femur. Electronically Signed   By: Lucienne Capers M.D.   On: 06/29/2018 00:38     CBC Recent Labs  Lab 06/29/18 0231  WBC 13.4*  HGB 14.3  HCT 42.3  PLT 255  MCV 85.6  MCH 28.9  MCHC 33.8  RDW 14.5    Chemistries  Recent Labs  Lab 06/29/18 0231  NA 138  K 3.7  CL 104  CO2 23  GLUCOSE 176*  BUN 23  CREATININE 0.77  CALCIUM 9.4  AST 23  ALT 17  ALKPHOS 71  BILITOT 0.6   ------------------------------------------------------------------------------------------------------------------ estimated creatinine clearance is 56.7 mL/min (by C-G formula  based on SCr of 0.77 mg/dL). ------------------------------------------------------------------------------------------------------------------ Recent Labs    06/29/18 0231  HGBA1C 5.5   ------------------------------------------------------------------------------------------------------------------ No results for input(s): CHOL, HDL, LDLCALC, TRIG, CHOLHDL, LDLDIRECT in the last  72 hours. ------------------------------------------------------------------------------------------------------------------ Recent Labs    06/29/18 0231  TSH 4.278   ------------------------------------------------------------------------------------------------------------------ No results for input(s): VITAMINB12, FOLATE, FERRITIN, TIBC, IRON, RETICCTPCT in the last 72 hours.  Coagulation profile No results for input(s): INR, PROTIME in the last 168 hours.  No results for input(s): DDIMER in the last 72 hours.  Cardiac Enzymes Recent Labs  Lab 06/29/18 0231  TROPONINI <0.03   ------------------------------------------------------------------------------------------------------------------ Invalid input(s): POCBNP    Assessment & Plan   This is a 77 year old female admitted for syncope. 1.    Possible syncope: Monitor telemetry.    Echocardiogram pending cardiology consult has been done  2.  Hip fracture: Patient awaiting surgery no contraindication to surgery patient can have echo post procedure 3.  Hypothyroidism: Check TSH: Continue Synthroid  4.  DVT prophylaxis: SCDs recommend Lovenox post procedure due to previous history of DVT 5.    Leukocytosis likely reactive follow CBC   Harrie Foreman, MD 06/29/2018, 2:46 AM     Code Status Orders  (From admission, onward)         Start     Ordered   06/29/18 0401  Full code  Continuous     06/29/18 0400        Code Status History    Date Active Date Inactive Code Status Order ID Comments User Context   03/04/2015 1013 03/07/2015  1330 Full Code [id number]  Bettey Costa, MD Inpatient           Consults ortho, cards  DVT Prophylaxis scds  Lab Results  Component Value Date   PLT 255 06/29/2018     Time Spent in minutes  46min 11am to 1145am Greater than 50% of time spent in care coordination and counseling patient regarding the condition and plan of care.   Dustin Flock M.D on 06/29/2018 at 1:47 PM  Between 7am to 6pm - Pager - [phone]  After 6pm go to [email or web address] - Proofreader  [employer]   Office  [phone]

## 2018-06-29 NOTE — Clinical Social Work Note (Signed)
Clinical Social Work Assessment  Patient Details  Name: Debra Patel MRN: [id number] Date of Birth: 1941-01-19  Date of referral:  06/29/18               Reason for consult:  Facility Placement                Permission sought to share information with:  Chartered certified accountant granted to share information::  Yes, Verbal Permission Granted  Name::      [place]::   [place]   Relationship::     Contact Information:     Housing/Transportation Living arrangements for the past 2 months:  [place] of Information:  Patient, Adult Children Patient Interpreter Needed:  None Criminal Activity/Legal Involvement Pertinent to Current Situation/Hospitalization:  No - Comment as needed Significant Relationships:  Adult Children Lives with:  Self Do you feel safe going back to the place where you live?  Yes Need for family participation in patient care:  Yes (Comment)  Care giving concerns:  Patient lives in [place] alone.    Social Worker assessment / plan:  Holiday representative ([place]) reviewed chart and noted that patient has a hip fracture. Surgery and PT are pending. CSW met with patient and her son Wille Glaser and daughter Elmo Putt were at bedside. Patient was alert and oriented X4 and was laying in the bed. CSW introduced self and explained role of CSW department. Per patient she lives alone and has 2 adult children. CSW explained that after surgery PT will evaluate patient and make a recommendation of home health or SNF. Per patient she prefers to go SNF. CSW made patient aware that Health Team will have to approve SNF and she will have a co-pay at SNF $10-$20 per day. Patient verbalized her understanding and is agreeable to SNF search in [place]. FL2 complete and faxed out. CSW will continue to follow and assist as needed.   Employment status:  Retired Nurse, adult PT Recommendations:  Not  assessed at this time [place] / Referral to community resources:  [place]  Patient/Family's Response to care:  Patient is agreeable to [facility] in [place].   Patient/Family's Understanding of and Emotional Response to Diagnosis, Current Treatment, and Prognosis:  Patient was very pleasant and thanked CSW for assistance.   Emotional Assessment Appearance:  Appears stated age Attitude/Demeanor/Rapport:    Affect (typically observed):  Accepting, Adaptable, Pleasant Orientation:  Oriented to Self, Oriented to Place, Oriented to  Time, Oriented to Situation Alcohol / Substance use:  Not Applicable Psych involvement (Current and /or in the community):  No (Comment)  Discharge Needs  Concerns to be addressed:  Discharge Planning Concerns Readmission within the last 30 days:  No Current discharge risk:  Dependent with Mobility Barriers to Discharge:  Continued Medical Work up   [employer], [name], LCSW 06/29/2018, 12:00 PM

## 2018-06-29 NOTE — Transfer of Care (Signed)
Immediate Anesthesia Transfer of Care Note  Patient: Debra Patel  Procedure(s) Performed: INTRAMEDULLARY (IM) NAIL INTERTROCHANTRIC- TFNA (Left )  Patient Location: PACU  Anesthesia Type:MAC  Level of Consciousness: awake  Airway & Oxygen Therapy: Patient Spontanous Breathing and Patient connected to nasal cannula oxygen  Post-op Assessment: Report given to RN and Post -op Vital signs reviewed and stable  Post vital signs: Reviewed and stable  Last Vitals:  Vitals Value Taken Time  BP 102/68 06/29/2018  1:53 PM  Temp 36.6 C 06/29/2018  1:51 PM  Pulse 93 06/29/2018  1:55 PM  Resp 15 06/29/2018  1:55 PM  SpO2 100 % 06/29/2018  1:55 PM  Vitals shown include unvalidated device data.  Last Pain:  Vitals:   06/29/18 1351  TempSrc:   PainSc: Asleep         Complications: No apparent anesthesia complications

## 2018-06-29 NOTE — H&P (Signed)
The patient has been re-examined, and the chart reviewed, and there have been no interval changes to the documented history and physical.  Plan a left hip trochanteric femoral nailing today.  Anesthesia is not consulted regarding a peripheral nerve block for post-operative pain.  The risks, benefits, and alternatives have been discussed at length, and the patient is willing to proceed.

## 2018-06-29 NOTE — Progress Notes (Signed)
*  PRELIMINARY RESULTS* Echocardiogram 2D Echocardiogram has been performed.  Debra Patel 06/29/2018, 4:48 PM

## 2018-06-29 NOTE — H&P (Signed)
Debra Patel is an 77 y.o. female.   Chief Complaint: Fall HPI: The patient with past medical history of needed breast cancer and recent tibial fracture presents the emergency department after a fall.  The patient remembers feeling dizzy as she stood at the side of the bed.  She fell and immediately felt pain in her left thigh.  X-ray in the emergency department revealed proximal intertrochanteric fracture of her left femur.  She denies palpitations, chest pain or shortness of breath prior to falling.  She admits to taking a Dramamine sometime earlier which may have contributed to her unsteadiness.  Nonetheless, due to her traumatic fall in the emergency department staff call hospitalist service for further evaluation of syncope.  Past Medical History:  Diagnosis Date  . BP (high blood pressure) 08/21/2014  . Brain infection   . Breast cancer ([place]) 2001   Left side mastectomy  . Personal history of chemotherapy 2001   BREAST CA  . Personal history of radiation therapy 2001   BREAST CA  . Radiation 2001   BREAST CA  . S/P chemotherapy, time since greater than 12 weeks 2001   BREAST CA  . Tachycardia   . Thyroid disease   . Tick fever     Past Surgical History:  Procedure Laterality Date  . ABDOMINAL HYSTERECTOMY    . BREAST EXCISIONAL BIOPSY Right 2002   EXCISIONAL - NEG  . HERNIA REPAIR    . Mass Removed from Bladder and Both Ovaries    . MASTECTOMY Left 2001   BREAST CA  . THYROIDECTOMY      Family History  Problem Relation Age of Onset  . Breast cancer Mother 51   Social History:  reports that she has never smoked. She has never used smokeless tobacco. She reports that she does not drink alcohol or use drugs.  Allergies:  Allergies  Allergen Reactions  . Pyridoxine Anaphylaxis  . Hydrocodone Nausea Only and Nausea And Vomiting  . Bee Venom Other (See Comments)  . Motrin [Ibuprofen] Other (See Comments)    depression  . Oxycodone Nausea Only  . Latex Rash  .  Nickel Rash     (Not in a hospital admission)  Results for orders placed or performed during the hospital encounter of 06/28/18 (from the past 48 hour(s))  CBC     Status: Abnormal   Collection Time: 06/29/18  2:31 AM  Result Value Ref Range   WBC 13.4 (H) 4.0 - 10.5 K/uL   RBC 4.94 3.87 - 5.11 MIL/uL   Hemoglobin 14.3 12.0 - 15.0 g/dL   HCT 42.3 36.0 - 46.0 %   MCV 85.6 80.0 - 100.0 fL   MCH 28.9 26.0 - 34.0 pg   MCHC 33.8 30.0 - 36.0 g/dL   RDW 14.5 11.5 - 15.5 %   Platelets 255 150 - 400 K/uL   nRBC 0.0 0.0 - 0.2 %    Comment: Performed at [facility], [address], [place], [place] [address]   Dg Chest 1 View  Result Date: 06/29/2018 CLINICAL DATA:  Left hip pain after a fall today. History of breast cancer. EXAM: CHEST  1 VIEW COMPARISON:  03/04/2015 FINDINGS: Emphysematous changes in the lungs. Normal heart size and pulmonary vascularity. No focal airspace disease or consolidation in the lungs. No blunting of costophrenic angles. No pneumothorax. Mediastinal contours appear intact. IMPRESSION: Emphysematous changes in the lungs. No evidence of active pulmonary disease. Electronically Signed   By: Oren Beckmann.D.  On: 06/29/2018 00:40   Dg Tibia/fibula Left  Result Date: 06/29/2018 CLINICAL DATA:  Pain after a fall today. EXAM: LEFT TIBIA AND FIBULA - 2 VIEW COMPARISON:  None. FINDINGS: Patient positioning limits the examination. Acute probably comminuted fractures of the proximal left tibial metaphysis with some impaction of fracture fragments. No obvious extension to the knee joint. Obliquity on the AP view limits evaluation of the tibial plateau and femoral condylar regions. Degenerative changes in the knee. Fibula appears intact. IMPRESSION: Acute posttraumatic fractures of the proximal left tibial metaphysis. Patient positioning limits evaluation of the knee joint. Electronically Signed   By: Lucienne Capers M.D.   On: 06/29/2018 00:39   Ct Head Wo  Contrast  Result Date: 06/28/2018 CLINICAL DATA:  Dizziness with resultant fall. EXAM: CT HEAD WITHOUT CONTRAST TECHNIQUE: Contiguous axial images were obtained from the base of the skull through the vertex without intravenous contrast. COMPARISON:  03/04/2015 FINDINGS: Brain: Stable mild superficial and central atrophy with chronic minimal small vessel ischemic disease of periventricular white matter. No intra-axial mass nor extra-axial fluid collections. Midline fourth ventricle and basal cisterns without effacement. Brainstem and cerebellum are unremarkable. No acute intracranial hemorrhage or large vascular territory infarct is identified. Vascular: No hyperdense vessels. Atherosclerosis of the carotid siphons. Skull: Negative for fracture or suspicious osseous lesions. Sinuses/Orbits: No acute finding. Other: None. IMPRESSION: Stable mild atrophy and chronic small vessel ischemia. No acute intracranial abnormality. Electronically Signed   By: Ashley Royalty M.D.   On: 06/28/2018 23:49   Dg Hip Unilat W Or Wo Pelvis 2-3 Views Left  Result Date: 06/29/2018 CLINICAL DATA:  Left hip pain after a fall today. Also fell 2 weeks ago. EXAM: DG HIP (WITH OR WITHOUT PELVIS) 2-3V LEFT COMPARISON:  07/26/2016 FINDINGS: Acute inter trochanteric fracture of the proximal left femur without significant displacement. No dislocation of the left hip. Pelvis appears intact. SI joints and symphysis pubis are not displaced. Degenerative changes in the lower lumbar spine and in both hips. Soft tissues are unremarkable. IMPRESSION: Acute posttraumatic intertrochanteric fracture of the proximal left femur. Electronically Signed   By: Lucienne Capers M.D.   On: 06/29/2018 00:38    Review of Systems  Constitutional: Negative for chills and fever.  HENT: Negative for sore throat and tinnitus.   Eyes: Negative for blurred vision and redness.  Respiratory: Negative for cough and shortness of breath.   Cardiovascular: Negative for  chest pain, palpitations, orthopnea and PND.  Gastrointestinal: Negative for abdominal pain, diarrhea, nausea and vomiting.  Genitourinary: Negative for dysuria, frequency and urgency.  Musculoskeletal: Positive for falls. Negative for joint pain and myalgias.  Skin: Negative for rash.       No lesions  Neurological: Positive for loss of consciousness. Negative for speech change, focal weakness and weakness.  Endo/Heme/Allergies: Does not bruise/bleed easily.       No temperature intolerance  Psychiatric/Behavioral: Negative for depression and suicidal ideas.    Blood pressure 138/79, pulse (!) 105, temperature 98.5 F (36.9 C), temperature source Oral, resp. rate 20, height 5\' 4"  (1.626 m), weight 64.4 kg, SpO2 98 %. Physical Exam  Vitals reviewed. Constitutional: She is oriented to person, place, and time. She appears well-developed and well-nourished. No distress.  HENT:  Head: Normocephalic and atraumatic.  Mouth/Throat: Oropharynx is clear and moist.  Eyes: Pupils are equal, round, and reactive to light. Conjunctivae and EOM are normal. No scleral icterus.  Neck: Normal range of motion. Neck supple. No JVD present. No tracheal deviation  present. No thyromegaly present.  Cardiovascular: Normal rate, regular rhythm and normal heart sounds. Exam reveals no gallop and no friction rub.  No murmur heard. Respiratory: Effort normal and breath sounds normal.  GI: Soft. Bowel sounds are normal. She exhibits no distension. There is no tenderness.  Genitourinary:  Genitourinary Comments: Deferred  Musculoskeletal: She exhibits deformity.  Lymphadenopathy:    She has no cervical adenopathy.  Neurological: She is alert and oriented to person, place, and time. No cranial nerve deficit. She exhibits normal muscle tone.  Skin: Skin is warm and dry. No rash noted. No erythema.  Psychiatric: She has a normal mood and affect. Her behavior is normal. Judgment and thought content normal.      Assessment/Plan This is a 77 year old female admitted for syncope. 1.  Syncope: Monitor telemetry.  Obtain echocardiogram.  Consult cardiology. 2.  Hip fracture: Consult orthopedic surgery 3.  Hypothyroidism: Check TSH: Continue Synthroid  4.  DVT prophylaxis: SCDs (the patient is no longer on Eliquis) 5.  GI prophylaxis: None The patient is a full code.  Time spent on admission orders and patient care approximately 45 minutes  Harrie Foreman, MD 06/29/2018, 2:46 AM

## 2018-06-29 NOTE — NC FL2 (Signed)
[place] LEVEL OF CARE SCREENING TOOL     IDENTIFICATION  Patient Name: Debra Patel Birthdate: [birth year] Sex: female Admission Date (Current Location): 06/28/2018  [place] and [place] Number:  Engineering geologist and Address:  [facility], [address] [place], [place] [address]      Provider Number: [id number]  Attending Physician Name and Address:  Dustin Flock, MD  Relative Name and Phone Number:       Current Level of Care: Hospital Recommended Level of Care: [place] Prior Approval Number:    Date Approved/Denied:   PASRR Number: [id number])  Discharge Plan: SNF    Current Diagnoses: Patient Active Problem List   Diagnosis Date Noted  . Syncope 06/29/2018  . 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 Fever 03/04/2015  . Febrile 03/04/2015  . Chest pain 02/28/2015  . Anxiety 10/23/2014  . Amnesia 10/23/2014  . Mild cognitive disorder 10/23/2014  . BP (high blood pressure) 08/21/2014  . 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 Adult hypothyroidism 11/24/2013  . Awareness of heartbeats 11/24/2013  . Mass of pelvis 11/15/2013  . Bad memory 07/27/2012  . Breast cancer, left ([place]) 04/21/2000    Orientation RESPIRATION BLADDER Height & Weight     Self, Time, Situation, Place  O2(2 Liters Oxygen. ) Continent Weight: 149 lb 11.1 oz (67.9 kg) Height:  5\' 4"  (162.6 cm)  BEHAVIORAL SYMPTOMS/MOOD NEUROLOGICAL BOWEL NUTRITION STATUS      Continent Diet(Diet: NPO for surgery to be advanced. )  AMBULATORY STATUS COMMUNICATION OF NEEDS Skin   Extensive Assist Verbally Surgical wounds                       Personal Care Assistance Level of Assistance  Bathing, Feeding, Dressing Bathing Assistance: Limited assistance Feeding assistance: Independent Dressing Assistance: Limited assistance     Functional Limitations Info  Sight, Hearing, Speech Sight Info: Adequate Hearing Info:  Adequate Speech Info: Adequate    SPECIAL CARE FACTORS FREQUENCY  PT (By licensed PT), OT (By licensed OT)     PT Frequency: (5) OT Frequency: (5)            Contractures      Additional Factors Info  Code Status, Allergies Code Status Info: (Full Code. ) Allergies Info: (Pyridoxine, Hydrocodone, Bee Venom, Motrin Ibuprofen, Oxycodone, Latex, Nickel)           Current Medications (06/29/2018):  This is the current hospital active medication list Current Facility-Administered Medications  Medication Dose Route Frequency Provider Last Rate Last Dose  . acetaminophen (TYLENOL) tablet 650 mg  650 mg Oral Q6H PRN Harrie Foreman, MD       Or  . acetaminophen (TYLENOL) suppository 650 mg  650 mg Rectal Q6H PRN Harrie Foreman, MD      . calcium-vitamin D (OSCAL WITH D) 500-200 MG-UNIT per tablet 1 tablet  1 tablet Oral Daily Harrie Foreman, MD      . ceFAZolin (ANCEF) IVPB 2g/100 mL premix  2 g Intravenous 30 min Pre-Op Lovell Sheehan, MD      . docusate sodium (COLACE) capsule 100 mg  100 mg Oral BID Harrie Foreman, MD      . levothyroxine (SYNTHROID, LEVOTHROID) tablet 50 mcg  50 mcg Oral QAC breakfast Harrie Foreman, MD      . morphine 2 MG/ML injection 2 mg  2 mg Intravenous Q4H PRN Harrie Foreman, MD      .  morphine 4 MG/ML injection 4 mg  4 mg Intravenous Q4H PRN Harrie Foreman, MD      . ondansetron [facility]) tablet 4 mg  4 mg Oral Q6H PRN Harrie Foreman, MD       Or  . ondansetron [facility]) injection 4 mg  4 mg Intravenous Q6H PRN Harrie Foreman, MD      . oxyCODONE (Oxy IR/ROXICODONE) immediate release tablet 5 mg  5 mg Oral Q6H PRN Harrie Foreman, MD   5 mg at 06/29/18 1053     Discharge Medications: Please see discharge summary for a list of discharge medications.  Relevant Imaging Results:  Relevant Lab Results:   Additional Information (SSN: [id number])  Debra Patel, Debra Beets, LCSW

## 2018-06-29 NOTE — Consult Note (Signed)
Cardiology Consultation Note    Patient ID: Debra Patel, MRN: [id number], DOB/AGE: Apr 21, 1941 77 y.o. Admit date: 06/28/2018   Date of Consult: 06/29/2018 Primary Physician: Luella Cook, NP Primary Cardiologist: Dr. Saralyn Pilar  Chief Complaint: syncope Reason for Consultation: syncope Requesting MD: Dr. Posey Pronto  HPI: Debra Patel is a 77 y.o. female with history of mitral and tricuspid regurgitation, PVCs as well as a history of pulmonary embolism in the past.  Also has a past medical history of breast carcinoma and a recent tibial fracture in late September.  Her pulmonary embolus occurred in May 2016 and she was on Eliquis for approximately 18 months for this.  She apparently has been quite active.  He has occasional palpitations.  She has a history of an ejection fraction of 50% with echocardiogram in January of this year showing this ejection fraction with moderate MR mild TR.  She presented to the emergency room on this admission after suffering a fall.  She states she feels dizzy as she stood up at the side of her bed.  She fell and felt pain in her thigh.  She was noted to have a proximal intertrochanteric fracture of her left femur.  She denied any chest pain or shortness of breath prior to the event.  She had been taking Dramamine due to some dizziness.  Electric cardiogram in the emergency room revealed sinus rhythm with no dysrhythmia.  Cardiac marker was normal.  Electrolytes were normal as was her serum creatinine.  Her glucose was 176.  She was not anemic.  White count was 13.4.  Chest x-ray revealed emphysematous changes in her lungs.  No active cardiopulmonary disease.  Brain CT revealed mild atrophy with chronic small vessel ischemia with no acute intracranial abnormality.  Patient felt off balance at the time of her fall.  She had stood up from bed and was turning around when she fell off balance causing a fall.  She denies loss of consciousness.  She had been dizzy earlier  in the week and had been taking Dramamine on occasion.  Past Medical History:  Diagnosis Date  . BP (high blood pressure) 08/21/2014  . Brain infection   . Breast cancer ([facility]) 2001   Left side mastectomy  . Personal history of chemotherapy 2001   BREAST CA  . Personal history of radiation therapy 2001   BREAST CA  . Radiation 2001   BREAST CA  . S/P chemotherapy, time since greater than 12 weeks 2001   BREAST CA  . Tachycardia   . Thyroid disease   . Tick fever       Surgical History:  Past Surgical History:  Procedure Laterality Date  . ABDOMINAL HYSTERECTOMY    . BREAST EXCISIONAL BIOPSY Right 2002   EXCISIONAL - NEG  . HERNIA REPAIR    . Mass Removed from Bladder and Both Ovaries    . MASTECTOMY Left 2001   BREAST CA  . THYROIDECTOMY       Home Meds: Prior to Admission medications   Medication Sig Start Date End Date Taking? Authorizing Provider  acetaminophen (TYLENOL) 500 MG tablet Take 1,000 mg by mouth every 6 (six) hours as needed for mild pain.   Yes [provider]  alendronate (FOSAMAX) 70 MG tablet Take 70 mg by mouth once a week. Take with a full glass of water on an empty stomach.   Yes [provider]  Calcium Carbonate-Vitamin D 600-400 MG-UNIT tablet Take 1 tablet by  mouth daily. 06/13/18  Yes [provider]  levothyroxine (SYNTHROID, LEVOTHROID) 50 MCG tablet Take 50 mcg by mouth every morning. (brand name only)   Yes [provider]  traMADol (ULTRAM) 50 MG tablet 1-2 tabs po q 8 hours prn Patient taking differently: Take 50-100 mg by mouth every 8 (eight) hours as needed for moderate pain or severe pain.  06/14/18  Yes Norval Gable, MD  apixaban (ELIQUIS) 5 MG TABS tablet Take 2 tablets (10 mg total) by mouth 2 (two) times daily. 03/07/15 03/12/15  Bettey Costa, MD    Inpatient Medications:  . calcium-vitamin D  1 tablet Oral Daily  . docusate sodium  100 mg Oral BID  . levothyroxine  50 mcg Oral QAC breakfast      Allergies:  Allergies  Allergen Reactions  . Pyridoxine Anaphylaxis  . Hydrocodone Nausea Only and Nausea And Vomiting  . Bee Venom Other (See Comments)  . Motrin [Ibuprofen] Other (See Comments)    depression  . Oxycodone Nausea Only  . Latex Rash  . Nickel Rash    Social History   Socioeconomic History  . Marital status: Widowed    Spouse name: Not on file  . Number of children: Not on file  . Years of education: Not on file  . Highest education level: Not on file  Occupational History  . Not on file  Social Needs  . Financial resource strain: Not on file  . Food insecurity:    Worry: Not on file    Inability: Not on file  . Transportation needs:    Medical: Not on file    Non-medical: Not on file  Tobacco Use  . Smoking status: Never Smoker  . Smokeless tobacco: Never Used  Substance and Sexual Activity  . Alcohol use: No  . Drug use: No  . Sexual activity: Not on file  Lifestyle  . Physical activity:    Days per week: Not on file    Minutes per session: Not on file  . Stress: Not on file  Relationships  . Social connections:    Talks on phone: Not on file    Gets together: Not on file    Attends religious service: Not on file    Active member of club or organization: Not on file    Attends meetings of clubs or organizations: Not on file    Relationship status: Not on file  . Intimate partner violence:    Fear of current or ex partner: Not on file    Emotionally abused: Not on file    Physically abused: Not on file    Forced sexual activity: Not on file  Other Topics Concern  . Not on file  Social History Narrative  . Not on file     Family History  Problem Relation Age of Onset  . Breast cancer Mother 79     Review of Systems: A 12-system review of systems was performed and is negative except as noted in the HPI.  Labs: Recent Labs    06/29/18 0231  TROPONINI <0.03   Lab Results  Component Value Date   WBC 13.4 (H) 06/29/2018    HGB 14.3 06/29/2018   HCT 42.3 06/29/2018   MCV 85.6 06/29/2018   PLT 255 06/29/2018    Recent Labs  Lab 06/29/18 0231  NA 138  K 3.7  CL 104  CO2 23  BUN 23  CREATININE 0.77  CALCIUM 9.4  PROT 7.1  BILITOT 0.6  ALKPHOS 71  ALT 17  AST 23  GLUCOSE 176*   No results found for: CHOL, HDL, LDLCALC, TRIG No results found for: DDIMER  Radiology/Studies:  Dg Chest 1 View  Result Date: 06/29/2018 CLINICAL DATA:  Left hip pain after a fall today. History of breast cancer. EXAM: CHEST  1 VIEW COMPARISON:  03/04/2015 FINDINGS: Emphysematous changes in the lungs. Normal heart size and pulmonary vascularity. No focal airspace disease or consolidation in the lungs. No blunting of costophrenic angles. No pneumothorax. Mediastinal contours appear intact. IMPRESSION: Emphysematous changes in the lungs. No evidence of active pulmonary disease. Electronically Signed   By: Lucienne Capers M.D.   On: 06/29/2018 00:40   Dg Tibia/fibula Left  Result Date: 06/29/2018 CLINICAL DATA:  Pain after a fall today. EXAM: LEFT TIBIA AND FIBULA - 2 VIEW COMPARISON:  None. FINDINGS: Patient positioning limits the examination. Acute probably comminuted fractures of the proximal left tibial metaphysis with some impaction of fracture fragments. No obvious extension to the knee joint. Obliquity on the AP view limits evaluation of the tibial plateau and femoral condylar regions. Degenerative changes in the knee. Fibula appears intact. IMPRESSION: Acute posttraumatic fractures of the proximal left tibial metaphysis. Patient positioning limits evaluation of the knee joint. Electronically Signed   By: Lucienne Capers M.D.   On: 06/29/2018 00:39   Dg Tibia/fibula Left  Result Date: 06/14/2018 CLINICAL DATA:  Left knee pain after fall. EXAM: LEFT TIBIA AND FIBULA - 2 VIEW COMPARISON:  None. FINDINGS: The fibula appears normal. No soft tissue abnormality is noted. Mildly displaced longitudinal fracture is seen involving  the medial portion of the proximal left tibia; intra-articular extension cannot be excluded. IMPRESSION: Mildly displaced longitudinal fracture seen involving the medial portion of proximal left tibia. CT scan of the left knee is recommended to evaluate for intra-articular extension. Electronically Signed   By: Marijo Conception, M.D.   On: 06/14/2018 20:23   Ct Head Wo Contrast  Result Date: 06/28/2018 CLINICAL DATA:  Dizziness with resultant fall. EXAM: CT HEAD WITHOUT CONTRAST TECHNIQUE: Contiguous axial images were obtained from the base of the skull through the vertex without intravenous contrast. COMPARISON:  03/04/2015 FINDINGS: Brain: Stable mild superficial and central atrophy with chronic minimal small vessel ischemic disease of periventricular white matter. No intra-axial mass nor extra-axial fluid collections. Midline fourth ventricle and basal cisterns without effacement. Brainstem and cerebellum are unremarkable. No acute intracranial hemorrhage or large vascular territory infarct is identified. Vascular: No hyperdense vessels. Atherosclerosis of the carotid siphons. Skull: Negative for fracture or suspicious osseous lesions. Sinuses/Orbits: No acute finding. Other: None. IMPRESSION: Stable mild atrophy and chronic small vessel ischemia. No acute intracranial abnormality. Electronically Signed   By: Ashley Royalty M.D.   On: 06/28/2018 23:49   Dg Knee Complete 4 Views Left  Result Date: 06/14/2018 CLINICAL DATA:  Fall with left knee pain and swelling EXAM: LEFT KNEE - COMPLETE 4+ VIEW COMPARISON:  None. FINDINGS: Large suprapatellar lipohemarthrosis. Medial tibial plateau vertical fracture with 3 mm medial displacement of the dominant fracture fragment. No dislocation. No suspicious focal osseous lesion. 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 No radiopaque foreign body. IMPRESSION: 1. Medial tibial plateau fracture with mild displacement. 2. Large  suprapatellar lipohemarthrosis. 3. 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 Electronically Signed   By: Ilona Sorrel M.D.   On: 06/14/2018 20:23   Dg Hip Unilat W Or Wo Pelvis 2-3 Views Left  Result Date: 06/29/2018 CLINICAL  DATA:  Left hip pain after a fall today. Also fell 2 weeks ago. EXAM: DG HIP (WITH OR WITHOUT PELVIS) 2-3V LEFT COMPARISON:  07/26/2016 FINDINGS: Acute inter trochanteric fracture of the proximal left femur without significant displacement. No dislocation of the left hip. Pelvis appears intact. SI joints and symphysis pubis are not displaced. Degenerative changes in the lower lumbar spine and in both hips. Soft tissues are unremarkable. IMPRESSION: Acute posttraumatic intertrochanteric fracture of the proximal left femur. Electronically Signed   By: Lucienne Capers M.D.   On: 06/29/2018 00:38    Wt Readings from Last 3 Encounters:  06/29/18 67.9 kg  06/14/18 64 kg  10/04/17 67.6 kg    EKG: Normal sinus rhythm with no arrhythmia or ischemia  Physical Exam:  Blood pressure (!) 109/58, pulse 81, temperature 98.4 F (36.9 C), temperature source Oral, resp. rate 18, height 5\' 4"  (1.626 m), weight 67.9 kg, SpO2 98 %. Body mass index is 25.69 kg/m. General: Well developed, well nourished, in no acute distress. Head: Normocephalic, atraumatic, sclera non-icteric, no xanthomas, nares are without discharge.  Neck: Negative for carotid bruits. JVD not elevated. Lungs: Clear bilaterally to auscultation without wheezes, rales, or rhonchi. Breathing is unlabored. Heart: RRR with S1 S2, grade 1/6 to 2/6 systolic murmur radiating to the left lower sternal border. Abdomen: Soft, non-tender, non-distended with normoactive bowel sounds. No hepatomegaly. No rebound/guarding. No obvious abdominal masses. Msk:  Strength and tone appear normal for age. Extremities: No clubbing or cyanosis. No edema.  Distal pedal pulses are 2+ and equal  bilaterally. Neuro: Alert and oriented X 3. No facial asymmetry. No focal deficit. Moves all extremities spontaneously. Psych:  Responds to questions appropriately with a normal affect.     Assessment and Plan  77 year old female with history of moderate MR and TR with preserved LV function and history of PVCs who is status post what appears to be a fall secondary to feeling off balance.  She suffered a left femoral fracture.  She was recovering from a recent tibial fracture.  She denies any chest pain.  Cardiac markers were negative.  Electrolytes were normal.  Echo is pending for later today however patient had preserved LV function with an EF of 50% by echo in January of this year.  She has no ischemic symptoms and was quite active prior to her recent tibial fracture.  Again she does not appear to have had a syncopal episode.  Will follow on telemetry for any arrhythmias and review echo when available however would proceed with surgery if needed from an orthopedic standpoint without further ischemic work-up as she had no symptoms of ischemia prior to her fractures and was quite active.  She appears to be optimized for surgery from a cardiac standpoint.  Signed, Teodoro Spray MD 06/29/2018, 7:55 AM Pager: [phone]

## 2018-06-29 NOTE — Anesthesia Post-op Follow-up Note (Signed)
Anesthesia QCDR form completed.        

## 2018-06-29 NOTE — Consult Note (Signed)
ORTHOPAEDIC CONSULTATION  REQUESTING PHYSICIAN: Dustin Flock, MD  Chief Complaint: left hip pain  HPI: Debra Patel is a 77 y.o. female who complains of left hip pain after a fall. She also had a recent left proximal tibia fracture that is under the care of Dr. Mack Guise. Please see H&P and ED notes for details.  Past Medical History:  Diagnosis Date  . BP (high blood pressure) 08/21/2014  . Brain infection   . Breast cancer ([place]) 2001   Left side mastectomy  . Personal history of chemotherapy 2001   BREAST CA  . Personal history of radiation therapy 2001   BREAST CA  . Radiation 2001   BREAST CA  . S/P chemotherapy, time since greater than 12 weeks 2001   BREAST CA  . Tachycardia   . Thyroid disease   . Tick fever    Past Surgical History:  Procedure Laterality Date  . ABDOMINAL HYSTERECTOMY    . BREAST EXCISIONAL BIOPSY Right 2002   EXCISIONAL - NEG  . HERNIA REPAIR    . Mass Removed from Bladder and Both Ovaries    . MASTECTOMY Left 2001   BREAST CA  . THYROIDECTOMY     Social History   Socioeconomic History  . Marital status: Widowed    Spouse name: Not on file  . Number of children: Not on file  . Years of education: Not on file  . Highest education level: Not on file  Occupational History  . Not on file  Social Needs  . Financial resource strain: Not on file  . Food insecurity:    Worry: Not on file    Inability: Not on file  . Transportation needs:    Medical: Not on file    Non-medical: Not on file  Tobacco Use  . Smoking status: Never Smoker  . Smokeless tobacco: Never Used  Substance and Sexual Activity  . Alcohol use: No  . Drug use: No  . Sexual activity: Not on file  Lifestyle  . Physical activity:    Days per week: Not on file    Minutes per session: Not on file  . Stress: Not on file  Relationships  . Social connections:    Talks on phone: Not on file    Gets together: Not on file    Attends religious service: Not on file     Active member of club or organization: Not on file    Attends meetings of clubs or organizations: Not on file    Relationship status: Not on file  Other Topics Concern  . Not on file  Social History Narrative  . Not on file   Family History  Problem Relation Age of Onset  . Breast cancer Mother 2   Allergies  Allergen Reactions  . Pyridoxine Anaphylaxis  . Hydrocodone Nausea Only and Nausea And Vomiting  . Alpha-Gal   . Bee Venom Other (See Comments)  . Motrin [Ibuprofen] Other (See Comments)    depression  . Oxycodone Nausea Only  . Latex Rash  . Nickel Rash   Prior to Admission medications   Medication Sig Start Date End Date Taking? Authorizing Provider  acetaminophen (TYLENOL) 500 MG tablet Take 1,000 mg by mouth every 6 (six) hours as needed for mild pain.   Yes [provider]  alendronate (FOSAMAX) 70 MG tablet Take 70 mg by mouth once a week. Take with a full glass of water on an empty stomach.   Yes [provider]  Calcium Carbonate-Vitamin D 600-400 MG-UNIT tablet Take 1 tablet by mouth daily. 06/13/18  Yes [provider]  levothyroxine (SYNTHROID, LEVOTHROID) 50 MCG tablet Take 50 mcg by mouth every morning. (brand name only)   Yes [provider]  traMADol (ULTRAM) 50 MG tablet 1-2 tabs po q 8 hours prn Patient taking differently: Take 50-100 mg by mouth every 8 (eight) hours as needed for moderate pain or severe pain.  06/14/18  Yes Norval Gable, MD  apixaban (ELIQUIS) 5 MG TABS tablet Take 2 tablets (10 mg total) by mouth 2 (two) times daily. 03/07/15 03/12/15  Bettey Costa, MD   Dg Chest 1 View  Result Date: 06/29/2018 CLINICAL DATA:  Left hip pain after a fall today. History of breast cancer. EXAM: CHEST  1 VIEW COMPARISON:  03/04/2015 FINDINGS: Emphysematous changes in the lungs. Normal heart size and pulmonary vascularity. No focal airspace disease or consolidation in the lungs. No blunting of costophrenic angles. No  pneumothorax. Mediastinal contours appear intact. IMPRESSION: Emphysematous changes in the lungs. No evidence of active pulmonary disease. Electronically Signed   By: Lucienne Capers M.D.   On: 06/29/2018 00:40   Dg Tibia/fibula Left  Result Date: 06/29/2018 CLINICAL DATA:  Pain after a fall today. EXAM: LEFT TIBIA AND FIBULA - 2 VIEW COMPARISON:  None. FINDINGS: Patient positioning limits the examination. Acute probably comminuted fractures of the proximal left tibial metaphysis with some impaction of fracture fragments. No obvious extension to the knee joint. Obliquity on the AP view limits evaluation of the tibial plateau and femoral condylar regions. Degenerative changes in the knee. Fibula appears intact. IMPRESSION: Acute posttraumatic fractures of the proximal left tibial metaphysis. Patient positioning limits evaluation of the knee joint. Electronically Signed   By: Lucienne Capers M.D.   On: 06/29/2018 00:39   Ct Head Wo Contrast  Result Date: 06/28/2018 CLINICAL DATA:  Dizziness with resultant fall. EXAM: CT HEAD WITHOUT CONTRAST TECHNIQUE: Contiguous axial images were obtained from the base of the skull through the vertex without intravenous contrast. COMPARISON:  03/04/2015 FINDINGS: Brain: Stable mild superficial and central atrophy with chronic minimal small vessel ischemic disease of periventricular white matter. No intra-axial mass nor extra-axial fluid collections. Midline fourth ventricle and basal cisterns without effacement. Brainstem and cerebellum are unremarkable. No acute intracranial hemorrhage or large vascular territory infarct is identified. Vascular: No hyperdense vessels. Atherosclerosis of the carotid siphons. Skull: Negative for fracture or suspicious osseous lesions. Sinuses/Orbits: No acute finding. Other: None. IMPRESSION: Stable mild atrophy and chronic small vessel ischemia. No acute intracranial abnormality. Electronically Signed   By: Ashley Royalty M.D.   On: 06/28/2018  23:49   Dg Hip Unilat W Or Wo Pelvis 2-3 Views Left  Result Date: 06/29/2018 CLINICAL DATA:  Left hip pain after a fall today. Also fell 2 weeks ago. EXAM: DG HIP (WITH OR WITHOUT PELVIS) 2-3V LEFT COMPARISON:  07/26/2016 FINDINGS: Acute inter trochanteric fracture of the proximal left femur without significant displacement. No dislocation of the left hip. Pelvis appears intact. SI joints and symphysis pubis are not displaced. Degenerative changes in the lower lumbar spine and in both hips. Soft tissues are unremarkable. IMPRESSION: Acute posttraumatic intertrochanteric fracture of the proximal left femur. Electronically Signed   By: Lucienne Capers M.D.   On: 06/29/2018 00:38    Positive ROS: All other systems have been reviewed and were otherwise negative with the exception of those mentioned in the HPI and as above.  Physical Exam: General: Alert, no acute distress  Cardiovascular: No pedal edema Respiratory: No cyanosis, no use of accessory musculature GI: No organomegaly, abdomen is soft and non-tender Skin: No lesions in the area of chief complaint Neurologic: Sensation intact distally Psychiatric: Patient is competent for consent with normal mood and affect Lymphatic: No axillary or cervical lymphadenopathy  MUSCULOSKELETAL: left leg short, externally rotated, good cap refill, +DF/PF/EHL  Assessment: Left hip closed displaced intertrochanteric hip fracture Left proximal tibia fracture   Plan: Plan trochanteric femoral nailing. She will be NWB secondary to tibia fracture.   The diagnosis, risks, benefits and alternatives to treatment are all discussed in detail with the patient and family. Risks include but are not limited to bleeding, infection, deep vein thrombosis, pulmonary embolism, nerve or vascular injury, non-union, repeat operation, persistent pain, weakness, stiffness and death. She understands and is eager to proceed.     Lovell Sheehan, MD    06/29/2018 11:59  AM

## 2018-06-29 NOTE — Clinical Social Work Placement (Signed)
   CLINICAL SOCIAL WORK PLACEMENT  NOTE  Date:  06/29/2018  Patient Details  Name: Debra Patel MRN: [id number] Date of Birth: 11-Feb-1941  Clinical Social Work is seeking post-discharge placement for this patient at the [place] level of care (*CSW will initial, date and re-position this form in  chart as items are completed):  Yes   Patient/family provided with [facility] Work Department's list of facilities offering this level of care within the geographic area requested by the patient (or if unable, by the patient's family).  Yes   Patient/family informed of their freedom to choose among providers that offer the needed level of care, that participate in Medicare, Medicaid or managed care program needed by the patient, have an available bed and are willing to accept the patient.  Yes   Patient/family informed of [facility]'s ownership interest in [facility] and [facility], as well as of the fact that they are under no obligation to receive care at these facilities.  PASRR submitted to EDS on 06/29/18     PASRR number received on 06/29/18     Existing PASRR number confirmed on       FL2 transmitted to all facilities in geographic area requested by pt/family on 06/29/18     FL2 transmitted to all facilities within larger geographic area on       Patient informed that his/her managed care company has contracts with or will negotiate with certain facilities, including the following:            Patient/family informed of bed offers received.  Patient chooses bed at       Physician recommends and patient chooses bed at      Patient to be transferred to   on  .  Patient to be transferred to facility by       Patient family notified on   of transfer.  Name of family member notified:        PHYSICIAN       Additional Comment:    _______________________________________________ [name], [name], LCSW 06/29/2018, 11:59 AM

## 2018-06-29 NOTE — Op Note (Signed)
DATE OF SURGERY:  06/29/2018  TIME: 1:51 PM  PATIENT NAME:  Debra Patel  AGE: 77 y.o.  PRE-OPERATIVE DIAGNOSIS:  left hip fracture  POST-OPERATIVE DIAGNOSIS:  SAME  PROCEDURE:  INTRAMEDULLARY (IM) NAIL INTERTROCHANTRIC- TFNA  SURGEON:  Lovell Sheehan  EBL:  409 cc  COMPLICATIONS:  None apparent  OPERATIVE IMPLANTS: Synthes trochanteric femoral nail  9 mm x 380 mm  with interlocking helical blade  735 mm  PREOPERATIVE INDICATIONS:  [name] is a 77 y.o. year old who fell and suffered a hip fracture. She was brought into the ER and then admitted and optimized and then elected for surgical intervention.    The risks benefits and alternatives were discussed with the patient including but not limited to the risks of nonoperative treatment, versus surgical intervention including infection, bleeding, nerve injury, malunion, nonunion, hardware prominence, hardware failure, need for hardware removal, blood clots, cardiopulmonary complications, morbidity, mortality, among others, and they were willing to proceed.    OPERATIVE PROCEDURE:  The patient was brought to the operating room and placed in the supine position.  Spinal anesthesia was administered. She was placed on the fracture table.  Closed reduction was performed under C-arm guidance. The length of the femur was also measured using fluoroscopy. Time out was then performed after sterile prep and drape. She received preoperative antibiotics.  Incision was made proximal to the greater trochanter. A guidewire was placed in the appropriate position. Confirmation was made on AP and lateral views. The above-named nail was opened. I opened the proximal femur with a reamer. I then placed the nail by hand easily down. I did not need to ream the femur.  Once the nail was completely seated, I placed a guidepin into the femoral head into the center center position through a second incision.  I measured the length, and then reamed the  lateral cortex and up into the head. I then placed the helical blade. Slight compression was applied. Anatomic fixation achieved. Bone quality was poor.  I then secured the proximal interlock.  I then removed the instruments, and took final C-arm pictures AP and lateral the entire length of the leg. Anatomic reconstruction was achieved, and the wounds were irrigated copiously and closed with Vicryl  followed by staples and dry sterile dressing. Sponge and needle count were correct.   The patient was awakened and returned to PACU in stable and satisfactory condition. There no complications and the patient tolerated the procedure well.  She will be non weight bearing due to an ipsilateral proximal tibia fracture.    Lovell Sheehan

## 2018-06-29 NOTE — Progress Notes (Signed)
Patient is going up to surgery via room bed. NSL in place. Signed consent in chart. IV ABX not in med room, text Pharmacy to send ABX to OR. Family at bedside.

## 2018-06-30 ENCOUNTER — Encounter: Payer: Self-pay | Admitting: Orthopedic Surgery

## 2018-06-30 LAB — COMPREHENSIVE METABOLIC PANEL WITH GFR
ALT: 14 U/L (ref 0–44)
AST: 17 U/L (ref 15–41)
Albumin: 3.2 g/dL — ABNORMAL LOW (ref 3.5–5.0)
Alkaline Phosphatase: 62 U/L (ref 38–126)
Anion gap: 9 (ref 5–15)
BUN: 11 mg/dL (ref 8–23)
CO2: 26 mmol/L (ref 22–32)
Calcium: 8.5 mg/dL — ABNORMAL LOW (ref 8.9–10.3)
Chloride: 104 mmol/L (ref 98–111)
Creatinine, Ser: 0.65 mg/dL (ref 0.44–1.00)
GFR calc Af Amer: >60 mL/min
GFR calc non Af Amer: >60 mL/min
Glucose, Bld: 130 mg/dL — ABNORMAL HIGH (ref 70–99)
Potassium: 4.2 mmol/L (ref 3.5–5.1)
Sodium: 139 mmol/L (ref 135–145)
Total Bilirubin: 0.9 mg/dL (ref 0.3–1.2)
Total Protein: 6 g/dL — ABNORMAL LOW (ref 6.5–8.1)

## 2018-06-30 LAB — CBC
HCT: 31.8 % — ABNORMAL LOW (ref 36.0–46.0)
Hemoglobin: 10.6 g/dL — ABNORMAL LOW (ref 12.0–15.0)
MCH: 29.4 pg (ref 26.0–34.0)
MCHC: 33.3 g/dL (ref 30.0–36.0)
MCV: 88.1 fL (ref 80.0–100.0)
NRBC: 0 % (ref 0.0–0.2)
PLATELETS: 136 10*3/uL — AB (ref 150–400)
RBC: 3.61 MIL/uL — AB (ref 3.87–5.11)
RDW: 14.5 % (ref 11.5–15.5)
WBC: 8.2 10*3/uL (ref 4.0–10.5)

## 2018-06-30 LAB — ECHOCARDIOGRAM COMPLETE
Height: 64 in
WEIGHTICAEL: 2395.08 [oz_av]

## 2018-06-30 MED ORDER — MAGNESIUM CITRATE PO SOLN
1.0000 | Freq: Once | ORAL | Status: AC
  Administered 2018-06-30: 1 via ORAL
  Filled 2018-06-30: qty 296

## 2018-06-30 NOTE — Progress Notes (Signed)
Subjective:  Patient reports pain as mild.  No other complaints  Objective:   VITALS:   Vitals:   06/29/18 2033 06/29/18 2136 06/30/18 0339 06/30/18 0821  BP: (!) 112/55 110/65 125/67 139/77  Pulse: (!) 102 97 (!) 106 (!) 108  Resp: 19 18 19 20   Temp: 97.7 F (36.5 C) 98.7 F (37.1 C) 98.4 F (36.9 C) 99.4 F (37.4 C)  TempSrc: Oral Oral Oral Oral  SpO2: 96% 97% 95% 96%  Weight:      Height:        PHYSICAL EXAM:  ABD soft Neurovascular intact Dorsiflexion/Plantar flexion intact Incision: dressing C/D/I Compartment soft  LABS  Results for orders placed or performed during the hospital encounter of 06/28/18 (from the past 24 hour(s))  Comprehensive metabolic panel     Status: Abnormal   Collection Time: 06/30/18  6:31 AM  Result Value Ref Range   Sodium 139 135 - 145 mmol/L   Potassium 4.2 3.5 - 5.1 mmol/L   Chloride 104 98 - 111 mmol/L   CO2 26 22 - 32 mmol/L   Glucose, Bld 130 (H) 70 - 99 mg/dL   BUN 11 8 - 23 mg/dL   Creatinine, Ser 0.65 0.44 - 1.00 mg/dL   Calcium 8.5 (L) 8.9 - 10.3 mg/dL   Total Protein 6.0 (L) 6.5 - 8.1 g/dL   Albumin 3.2 (L) 3.5 - 5.0 g/dL   AST 17 15 - 41 U/L   ALT 14 0 - 44 U/L   Alkaline Phosphatase 62 38 - 126 U/L   Total Bilirubin 0.9 0.3 - 1.2 mg/dL   GFR calc non Af Amer >60 >60 mL/min   GFR calc Af Amer >60 >60 mL/min   Anion gap 9 5 - 15  CBC     Status: Abnormal   Collection Time: 06/30/18 10:43 AM  Result Value Ref Range   WBC 8.2 4.0 - 10.5 K/uL   RBC 3.61 (L) 3.87 - 5.11 MIL/uL   Hemoglobin 10.6 (L) 12.0 - 15.0 g/dL   HCT 31.8 (L) 36.0 - 46.0 %   MCV 88.1 80.0 - 100.0 fL   MCH 29.4 26.0 - 34.0 pg   MCHC 33.3 30.0 - 36.0 g/dL   RDW 14.5 11.5 - 15.5 %   Platelets 136 (L) 150 - 400 K/uL   nRBC 0.0 0.0 - 0.2 %    Dg Chest 1 View  Result Date: 06/29/2018 CLINICAL DATA:  Left hip pain after a fall today. History of breast cancer. EXAM: CHEST  1 VIEW COMPARISON:  03/04/2015 FINDINGS: Emphysematous changes in the  lungs. Normal heart size and pulmonary vascularity. No focal airspace disease or consolidation in the lungs. No blunting of costophrenic angles. No pneumothorax. Mediastinal contours appear intact. IMPRESSION: Emphysematous changes in the lungs. No evidence of active pulmonary disease. Electronically Signed   By: Lucienne Capers M.D.   On: 06/29/2018 00:40   Dg Tibia/fibula Left  Result Date: 06/29/2018 CLINICAL DATA:  Pain after a fall today. EXAM: LEFT TIBIA AND FIBULA - 2 VIEW COMPARISON:  None. FINDINGS: Patient positioning limits the examination. Acute probably comminuted fractures of the proximal left tibial metaphysis with some impaction of fracture fragments. No obvious extension to the knee joint. Obliquity on the AP view limits evaluation of the tibial plateau and femoral condylar regions. Degenerative changes in the knee. Fibula appears intact. IMPRESSION: Acute posttraumatic fractures of the proximal left tibial metaphysis. Patient positioning limits evaluation of the knee joint. Electronically Signed  By: Lucienne Capers M.D.   On: 06/29/2018 00:39   Ct Head Wo Contrast  Result Date: 06/28/2018 CLINICAL DATA:  Dizziness with resultant fall. EXAM: CT HEAD WITHOUT CONTRAST TECHNIQUE: Contiguous axial images were obtained from the base of the skull through the vertex without intravenous contrast. COMPARISON:  03/04/2015 FINDINGS: Brain: Stable mild superficial and central atrophy with chronic minimal small vessel ischemic disease of periventricular white matter. No intra-axial mass nor extra-axial fluid collections. Midline fourth ventricle and basal cisterns without effacement. Brainstem and cerebellum are unremarkable. No acute intracranial hemorrhage or large vascular territory infarct is identified. Vascular: No hyperdense vessels. Atherosclerosis of the carotid siphons. Skull: Negative for fracture or suspicious osseous lesions. Sinuses/Orbits: No acute finding. Other: None. IMPRESSION:  Stable mild atrophy and chronic small vessel ischemia. No acute intracranial abnormality. Electronically Signed   By: Ashley Royalty M.D.   On: 06/28/2018 23:49   Dg Hip Operative Unilat W Or W/o Pelvis Left  Result Date: 06/29/2018 CLINICAL DATA:  ORIF intertrochanteric left proximal femur fracture EXAM: OPERATIVE LEFT HIP (WITH PELVIS IF PERFORMED) 3 VIEWS TECHNIQUE: Fluoroscopic spot image(s) were submitted for interpretation post-operatively. COMPARISON:  06/28/2018 left hip radiographs FINDINGS: Fluoroscopy time 0 minutes 36 seconds. Three nondiagnostic spot fluoroscopic intraoperative left femur radiographs demonstrate transfixation of intertrochanteric left proximal femur fracture by left femoral neck pin with interlocking intramedullary rod in the left femoral shaft. IMPRESSION: Intraoperative fluoroscopic guidance for ORIF intertrochanteric left proximal femur fracture. Electronically Signed   By: Ilona Sorrel M.D.   On: 06/29/2018 15:38   Dg Hip Unilat W Or Wo Pelvis 2-3 Views Left  Result Date: 06/29/2018 CLINICAL DATA:  Left hip pain after a fall today. Also fell 2 weeks ago. EXAM: DG HIP (WITH OR WITHOUT PELVIS) 2-3V LEFT COMPARISON:  07/26/2016 FINDINGS: Acute inter trochanteric fracture of the proximal left femur without significant displacement. No dislocation of the left hip. Pelvis appears intact. SI joints and symphysis pubis are not displaced. Degenerative changes in the lower lumbar spine and in both hips. Soft tissues are unremarkable. IMPRESSION: Acute posttraumatic intertrochanteric fracture of the proximal left femur. Electronically Signed   By: Lucienne Capers M.D.   On: 06/29/2018 00:38    Assessment/Plan: 1 Day Post-Op   Active Problems:   Syncope   Advance diet Up with therapy, continue Non-weight bearing on the left lower extremity secondary to tibial plateau fracture. No hip precautions. Discharge to SNF when medically stable. Ok for transfer from orthopedic  standpoint. [place] for anticoagulation from orthopedic standpoint. She will return to my office in 2 weeks for staple removal. Please call with questions.   Lovell Sheehan , MD 06/30/2018, 12:00 PM

## 2018-06-30 NOTE — Anesthesia Postprocedure Evaluation (Signed)
Anesthesia Post Note  Patient: Debra Patel  Procedure(s) Performed: INTRAMEDULLARY (IM) NAIL INTERTROCHANTRIC- TFNA (Left )  Patient location during evaluation: Nursing Unit Anesthesia Type: Spinal Level of consciousness: oriented and awake and alert Pain management: pain level controlled Vital Signs Assessment: post-procedure vital signs reviewed and stable Respiratory status: spontaneous breathing Cardiovascular status: blood pressure returned to baseline and stable Postop Assessment: no headache, no backache, no apparent nausea or vomiting, spinal receding and patient able to bend at knees Anesthetic complications: no     Last Vitals:  Vitals:   06/30/18 0339 06/30/18 0821  BP: 125/67 139/77  Pulse: (!) 106 (!) 108  Resp: 19 20  Temp: 36.9 C 37.4 C  SpO2: 95% 96%    Last Pain:  Vitals:   06/30/18 0930  TempSrc:   PainSc: 7                  Precious Haws Pricella Gaugh

## 2018-06-30 NOTE — Progress Notes (Addendum)
Physical Therapy Evaluation Patient Details Name: Debra Patel MRN: [id number] DOB: Oct 19, 1940 Today's Date: 06/30/2018   History of Present Illness  The patient with past medical history of needed breast cancer and recent tibial fracture presents the emergency department after a fall.  The patient remembers feeling dizzy as she stood at the side of the bed.  She fell and immediately felt pain in her left thigh.  X-ray in the emergency department revealed proximal intertrochanteric fracture of her left femur.  She denies palpitations, chest pain or shortness of breath prior to falling.  She admits to taking a Dramamine sometime earlier which may have contributed to her unsteadiness.  Nonetheless, due to her traumatic fall in the emergency department staff call hospitalist service for further evaluation of syncope. Pt is currently s/p L IM nail secondary to fall. She is POD#1 at time of initial evaluation.   Clinical Impression  Pt admitted with above diagnosis. Pt currently with functional limitations due to the deficits listed below (see PT Problem List). HR is elevated between 115-120 bpm at rest upon arrival. Pt requires max assist+2 to get up to the edge of bed secondary to pain with all attempts at LLE movement. Pt requires cues for safe hand placement and sequencing for transfers. Once standing she requires minA+1 to stabilize and supports herself on the rolling walker. She is able to hop on RLE with assist to keep LLE off the ground. She fatigues quickly and struggles to keep all of her weight off LLE. Assist required with walker to turn and pt requires recliner to be pulled up behind her due to difficulty moving backwards. Once sitting HR is checked and has increased to around 140 bpm. MD is aware of tachycardia. She will need SNF placement at home as she needs physical therapy to improve her function in order to safely transition back home. Pt will benefit from PT services to address deficits  in strength, balance, and mobility in order to return to full function at home.     Follow Up Recommendations SNF    Equipment Recommendations  None recommended by PT;Other (comment)(Might benefit from wheel kit for her walker)    Recommendations for Other Services       Precautions / Restrictions Precautions Precautions: Fall Restrictions Weight Bearing Restrictions: Yes LLE Weight Bearing: Non weight bearing      Mobility  Bed Mobility Overal bed mobility: Needs Assistance Bed Mobility: Supine to Sit     Supine to sit: Max assist;+2 for physical assistance     General bed mobility comments: Pt requires assist to get up to the edge of bed secondary to pain with all attempts at LLE movement  Transfers Overall transfer level: Needs assistance Equipment used: Rolling walker (2 wheeled) Transfers: Sit to/from Stand Sit to Stand: Min assist;+2 physical assistance         General transfer comment: Pt requires cues for safe hand placement and sequencing. Once standing she requires minA+1 to stabilize and support on rolling walker.   Ambulation/Gait Ambulation/Gait assistance: Min guard Gait Distance (Feet): 3 Feet Assistive device: Rolling walker (2 wheeled)       General Gait Details: Pt is able to hop on RLE with assist to keep LLE off the ground. She fatigues quickly and struggles to keep all of her weight off LLE. Assist required with walker and pt requires recliner to be pulled up behind her due to difficulty moving backwards.   Stairs  Wheelchair Mobility    Modified Rankin (Stroke Patients Only)       Balance Overall balance assessment: Needs assistance Sitting-balance support: No upper extremity supported Sitting balance-Leahy Scale: Good     Standing balance support: Bilateral upper extremity supported Standing balance-Leahy Scale: Poor Standing balance comment: Requires support to stabilize in standing                              Pertinent Vitals/Pain Pain Assessment: 0-10 Pain Score: 10-Worst pain ever Pain Location: Pt complianing of 10/10 L hip pain Pain Descriptors / Indicators: Operative site guarding Pain Intervention(s): Premedicated before session    Home Living Family/patient expects to be discharged to:: Private residence Living Arrangements: Alone Available Help at Discharge: Family Type of Home: House Home Access: Ramped entrance     Home Layout: Multi-level;Able to live on main level with bedroom/bathroom Home Equipment: Walker - standard;Wheelchair - [employer];Toilet riser;Shower seat      Prior Function Level of Independence: Independent         Comments: Prior to tibial fracture pt was independent with ADLs/IADLs. Drives and ambulates without assistive device. Since tibial fracture pt has been using a standard walker and wheelchair     Hand Dominance        Extremity/Trunk Assessment   Upper Extremity Assessment Upper Extremity Assessment: Overall WFL for tasks assessed    Lower Extremity Assessment Lower Extremity Assessment: LLE deficits/detail LLE Deficits / Details: Pt with very limited ability to move LLE secondary to pain. Requires assist for SLR and SAQ       Communication   Communication: No difficulties  Cognition Arousal/Alertness: Awake/alert Behavior During Therapy: WFL for tasks assessed/performed Overall Cognitive Status: Within Functional Limits for tasks assessed                                        General Comments      Exercises General Exercises - Lower Extremity Ankle Circles/Pumps: Both;10 reps Quad Sets: Both;10 reps Gluteal Sets: Both;10 reps Short Arc Quad: Left;10 reps Hip ABduction/ADduction: Left;10 reps Straight Leg Raises: Left;5 reps   Assessment/Plan    PT Assessment Patient needs continued PT services  PT Problem List Decreased strength;Decreased activity tolerance;Decreased  balance;Decreased mobility       PT Treatment Interventions DME instruction;Gait training;Stair training;Functional mobility training;Therapeutic activities;Balance training;Therapeutic exercise;Neuromuscular re-education;Manual techniques    PT Goals (Current goals can be found in the Care Plan section)  Acute Rehab PT Goals Patient Stated Goal: Return to prior level of function  PT Goal Formulation: With patient Time For Goal Achievement: 07/14/18 Potential to Achieve Goals: Good    Frequency BID   Barriers to discharge        Co-evaluation               AM-PAC PT "6 Clicks" Daily Activity  Outcome Measure Difficulty turning over in bed (including adjusting bedclothes, sheets and blankets)?: Unable Difficulty moving from lying on back to sitting on the side of the bed? : Unable Difficulty sitting down on and standing up from a chair with arms (e.g., wheelchair, bedside commode, etc,.)?: Unable Help needed moving to and from a bed to chair (including a wheelchair)?: A Lot Help needed walking in hospital room?: Total Help needed climbing 3-5 steps with a railing? : Total 6 Click Score: 7  End of Session Equipment Utilized During Treatment: Gait belt Activity Tolerance: Patient tolerated treatment well Patient left: in chair;with call bell/phone within reach;with chair alarm set Nurse Communication: Mobility status PT Visit Diagnosis: Unsteadiness on feet (R26.81);Muscle weakness (generalized) (M62.81);Repeated falls (R29.6);Other abnormalities of gait and mobility (R26.89);Pain Pain - Right/Left: Left Pain - part of body: Hip    Time: 4961-1643 PT Time Calculation (min) (ACUTE ONLY): 41 min   Charges:   PT Evaluation $PT Eval Low Complexity: 1 Low PT Treatments $Therapeutic Exercise: 8-22 mins        Aminat Shelburne D Ambrose Wile PT, DPT, GCS   Carolyn Sylvia 06/30/2018, 1:22 PM

## 2018-06-30 NOTE — Progress Notes (Signed)
Physical Therapy Treatment Patient Details Name: Debra Patel MRN: [id number] DOB: Oct 15, 1940 Today's Date: 06/30/2018    History of Present Illness The patient with past medical history of needed breast cancer and recent tibial fracture presents the emergency department after a fall.  The patient remembers feeling dizzy as she stood at the side of the bed.  She fell and immediately felt pain in her left thigh.  X-ray in the emergency department revealed proximal intertrochanteric fracture of her left femur.  She denies palpitations, chest pain or shortness of breath prior to falling.  She admits to taking a Dramamine sometime earlier which may have contributed to her unsteadiness.  Nonetheless, due to her traumatic fall in the emergency department staff call hospitalist service for further evaluation of syncope. Pt is currently s/p L IM nail secondary to fall. She is POD#1 at time of initial evaluation.     PT Comments    Pt is making good progress with therapy. Her pain has improved but it still increases with transfers and very limited ambulation. She is again able to complete bed exercises with patient. She will need SNF placement at discharge for physical therapy in order to safely return home. Pt does have the capacity to participate with therapy and demonstrate improvement. Pt will benefit from PT services to address deficits in strength, balance, and mobility in order to return to full function at home.     Follow Up Recommendations  SNF     Equipment Recommendations  None recommended by PT;Other (comment)(Might benefit from wheel kit for her walker)    Recommendations for Other Services       Precautions / Restrictions Precautions Precautions: Fall Restrictions Weight Bearing Restrictions: Yes LLE Weight Bearing: Non weight bearing    Mobility  Bed Mobility Overal bed mobility: Needs Assistance Bed Mobility: Sit to Supine     Supine to sit: Max assist;+2 for physical  assistance Sit to supine: Mod assist;+2 for physical assistance   General bed mobility comments: Pt requires trunk and LLE assist when returnign to supine from sitting at EOB  Transfers Overall transfer level: Needs assistance Equipment used: Rolling walker (2 wheeled) Transfers: Sit to/from Stand Sit to Stand: Min assist;+2 physical assistance         General transfer comment: Pt again requiring cues for transfers. Improved stability in standing this afternoon  Ambulation/Gait Ambulation/Gait assistance: Min guard Gait Distance (Feet): 3 Feet Assistive device: Rolling walker (2 wheeled)       General Gait Details: Pt is able to hop on RLE to transfer from recliner to bed. Cues for proper sequencing. Therapist utilizes foot under patient's LLE to ensure she maintains NWB status.    Stairs             Wheelchair Mobility    Modified Rankin (Stroke Patients Only)       Balance Overall balance assessment: Needs assistance Sitting-balance support: No upper extremity supported Sitting balance-Leahy Scale: Good     Standing balance support: Bilateral upper extremity supported Standing balance-Leahy Scale: Poor Standing balance comment: Requires support to stabilize in standing                            Cognition Arousal/Alertness: Awake/alert Behavior During Therapy: WFL for tasks assessed/performed Overall Cognitive Status: Within Functional Limits for tasks assessed  Exercises General Exercises - Lower Extremity Ankle Circles/Pumps: Both;10 reps Quad Sets: Both;10 reps Gluteal Sets: Both;10 reps Short Arc Quad: Left;10 reps Hip ABduction/ADduction: Left;10 reps Straight Leg Raises: Left;5 reps    General Comments        Pertinent Vitals/Pain Pain Assessment: 0-10 Pain Score: 5  Pain Location: L hip Pain Descriptors / Indicators: Operative site guarding Pain Intervention(s):  Premedicated before session    Home Living Family/patient expects to be discharged to:: Private residence Living Arrangements: Alone Available Help at Discharge: Family Type of Home: House Home Access: Ramped entrance   Home Layout: Multi-level;Able to live on main level with bedroom/bathroom Home Equipment: Walker - standard;Wheelchair - [employer];Toilet riser;Shower seat      Prior Function Level of Independence: Independent      Comments: Prior to tibial fracture pt was independent with ADLs/IADLs. Drives and ambulates without assistive device. Since tibial fracture pt has been using a standard walker and wheelchair   PT Goals (current goals can now be found in the care plan section) Acute Rehab PT Goals Patient Stated Goal: Return to prior level of function  PT Goal Formulation: With patient Time For Goal Achievement: 07/14/18 Potential to Achieve Goals: Good Progress towards PT goals: Progressing toward goals    Frequency    BID      PT Plan Current plan remains appropriate    Co-evaluation              AM-PAC PT "6 Clicks" Daily Activity  Outcome Measure  Difficulty turning over in bed (including adjusting bedclothes, sheets and blankets)?: Unable Difficulty moving from lying on back to sitting on the side of the bed? : Unable Difficulty sitting down on and standing up from a chair with arms (e.g., wheelchair, bedside commode, etc,.)?: Unable Help needed moving to and from a bed to chair (including a wheelchair)?: A Lot Help needed walking in hospital room?: Total Help needed climbing 3-5 steps with a railing? : Total 6 Click Score: 7    End of Session Equipment Utilized During Treatment: Gait belt Activity Tolerance: Patient tolerated treatment well Patient left: with call bell/phone within reach;in bed;with bed alarm set;with family/visitor present;with SCD's reapplied Nurse Communication: Mobility status PT Visit Diagnosis: Unsteadiness  on feet (R26.81);Muscle weakness (generalized) (M62.81);Repeated falls (R29.6);Other abnormalities of gait and mobility (R26.89);Pain Pain - Right/Left: Left Pain - part of body: Hip     Time: 0630-1601 PT Time Calculation (min) (ACUTE ONLY): 33 min  Charges:  $Gait Training: 8-22 mins $Therapeutic Exercise: 8-22 mins                     Lyndel Safe Kourtney Montesinos PT, DPT, GCS    Venise Ellingwood 06/30/2018, 2:56 PM

## 2018-06-30 NOTE — Progress Notes (Signed)
PT is recommending SNF. Clinical Education officer, museum (CSW) met patient and her son Wille Glaser and presented bed offers. Per Wille Glaser he will review offers and call CSW back today with their choice. CSW left a voicemail for the on call case manager at [facility] to start SNF authorization.   [employer], LCSW [phone]

## 2018-06-30 NOTE — Progress Notes (Signed)
[facility] authorization has been received, authorization # [id number]. Clinical Social Worker (CSW) met with patient and her son Wille Glaser and made them aware of above. Per patient she has not made a decision about which SNF yet and stated that she will call CSW today with her choice.   [employer], LCSW [phone]

## 2018-06-30 NOTE — Progress Notes (Signed)
[place] [facility]                                                                                                                                                                                  Patient Demographics   Debra Patel, is a 77 y.o. female, DOB - 1940/11/21, [place] date - 06/28/2018   Admitting Physician Harrie Foreman, MD  Outpatient Primary MD for the patient is Cicale, Jim Like, NP   LOS - 1  Subjective: Pt doing better has pain hr elevated  Review of Systems:   CONSTITUTIONAL: No documented fever. No fatigue, weakness. No weight gain, no weight loss.  EYES: No blurry or double vision.  ENT: No tinnitus. No postnasal drip. No redness of the oropharynx.  RESPIRATORY: No cough, no wheeze, no hemoptysis. No dyspnea.  CARDIOVASCULAR: No chest pain. No orthopnea. No palpitations. No syncope.  GASTROINTESTINAL: No nausea, no vomiting or diarrhea. No abdominal pain. No melena or hematochezia.  GENITOURINARY: No dysuria or hematuria.  ENDOCRINE: No polyuria or nocturia. No heat or cold intolerance.  HEMATOLOGY: No anemia. No bruising. No bleeding.  INTEGUMENTARY: No rashes. No lesions.  MUSCULOSKELETAL: No arthritis. No swelling. No gout.  Positive hip pain NEUROLOGIC: No numbness, tingling, or ataxia. No seizure-type activity.  PSYCHIATRIC: No anxiety. No insomnia. No ADD.    Vitals:   Vitals:   06/29/18 2136 06/30/18 0339 06/30/18 0821 06/30/18 1202  BP: 110/65 125/67 139/77 (!) 116/59  Pulse: 97 (!) 106 (!) 108 96  Resp: 18 19 20    Temp: 98.7 F (37.1 C) 98.4 F (36.9 C) 99.4 F (37.4 C) 99.1 F (37.3 C)  TempSrc: Oral Oral Oral Oral  SpO2: 97% 95% 96% 100%  Weight:      Height:        Wt Readings from Last 3 Encounters:  06/29/18 67.9 kg  06/14/18 64 kg  10/04/17 67.6 kg     Intake/Output Summary (Last 24 hours) at 06/30/2018 1309 Last data filed at 06/30/2018 1026 Gross per 24 hour  Intake  1740 ml  Output 1750 ml  Net -10 ml    Physical Exam:   GENERAL: Pleasant-appearing in no apparent distress.  HEAD, EYES, EARS, NOSE AND THROAT: Atraumatic, normocephalic. Extraocular muscles are intact. Pupils equal and reactive to light. Sclerae anicteric. No conjunctival injection. No oro-pharyngeal erythema.  NECK: Supple. There is no jugular venous distention. No bruits, no lymphadenopathy, no thyromegaly.  HEART: Regular rate and rhythm tacycardia. No murmurs, no rubs, no clicks.  LUNGS: Clear to auscultation bilaterally. No rales or rhonchi. No wheezes.  ABDOMEN: Soft, flat, nontender, nondistended. Has good bowel sounds. No  hepatosplenomegaly appreciated.  EXTREMITIES: No evidence of any cyanosis, clubbing, or peripheral edema.  +2 pedal and radial pulses bilaterally.  NEUROLOGIC: The patient is alert, awake, and oriented x3 with no focal motor or sensory deficits appreciated bilaterally.  SKIN: Moist and warm with no rashes appreciated.  Psych: Not anxious, depressed LN: No inguinal LN enlargement    Antibiotics   Anti-infectives (From admission, onward)   Start     Dose/Rate Route Frequency Ordered Stop   06/29/18 1830  ceFAZolin (ANCEF) IVPB 1 g/50 mL premix     1 g 100 mL/hr over 30 Minutes Intravenous Every 6 hours 06/29/18 1514 06/30/18 0620   06/29/18 1325  50,000 units bacitracin in 0.9% normal saline 250 mL irrigation  Status:  Discontinued       As needed 06/29/18 1325 06/29/18 1345   06/29/18 1213  ceFAZolin (ANCEF) 2-4 GM/100ML-% IVPB    Note to Pharmacy:  Milinda Cave   : cabinet override      06/29/18 1213 06/29/18 1230   06/29/18 1028  ceFAZolin (ANCEF) IVPB 2g/100 mL premix     2 g 200 mL/hr over 30 Minutes Intravenous 30 min pre-op 06/29/18 1028 06/29/18 1230      Medications   Scheduled Meds: . calcium-vitamin D  1 tablet Oral Daily  . docusate sodium  100 mg Oral BID  . enoxaparin (LOVENOX) injection  40 mg Subcutaneous Q24H  . levothyroxine   50 mcg Oral QAC breakfast   Continuous Infusions: . lactated ringers     PRN Meds:.acetaminophen **OR** acetaminophen, cyclobenzaprine, metoCLOPramide **OR** metoCLOPramide (REGLAN) injection, morphine injection, morphine injection, ondansetron **OR** ondansetron (ZOFRAN) IV, oxyCODONE   Data Review:   Micro Results Recent Results (from the past 240 hour(s))  Surgical pcr screen     Status: None   Collection Time: 06/29/18  4:29 AM  Result Value Ref Range Status   MRSA, PCR NEGATIVE NEGATIVE Final   Staphylococcus aureus NEGATIVE NEGATIVE Final    Comment: (NOTE) The Xpert SA Assay (FDA approved for NASAL specimens in patients 78 years of age and older), is one component of a comprehensive surveillance program. It is not intended to diagnose infection nor to guide or monitor treatment. Performed at [facility], [address] [place], [place] [address]     Radiology Reports Dg Chest 1 View  Result Date: 06/29/2018 CLINICAL DATA:  Left hip pain after a fall today. History of breast cancer. EXAM: CHEST  1 VIEW COMPARISON:  03/04/2015 FINDINGS: Emphysematous changes in the lungs. Normal heart size and pulmonary vascularity. No focal airspace disease or consolidation in the lungs. No blunting of costophrenic angles. No pneumothorax. Mediastinal contours appear intact. IMPRESSION: Emphysematous changes in the lungs. No evidence of active pulmonary disease. Electronically Signed   By: Lucienne Capers M.D.   On: 06/29/2018 00:40   Dg Tibia/fibula Left  Result Date: 06/29/2018 CLINICAL DATA:  Pain after a fall today. EXAM: LEFT TIBIA AND FIBULA - 2 VIEW COMPARISON:  None. FINDINGS: Patient positioning limits the examination. Acute probably comminuted fractures of the proximal left tibial metaphysis with some impaction of fracture fragments. No obvious extension to the knee joint. Obliquity on the AP view limits evaluation of the tibial plateau and femoral condylar regions.  Degenerative changes in the knee. Fibula appears intact. IMPRESSION: Acute posttraumatic fractures of the proximal left tibial metaphysis. Patient positioning limits evaluation of the knee joint. Electronically Signed   By: Lucienne Capers M.D.   On: 06/29/2018 00:39  Dg Tibia/fibula Left  Result Date: 06/14/2018 CLINICAL DATA:  Left knee pain after fall. EXAM: LEFT TIBIA AND FIBULA - 2 VIEW COMPARISON:  None. FINDINGS: The fibula appears normal. No soft tissue abnormality is noted. Mildly displaced longitudinal fracture is seen involving the medial portion of the proximal left tibia; intra-articular extension cannot be excluded. IMPRESSION: Mildly displaced longitudinal fracture seen involving the medial portion of proximal left tibia. CT scan of the left knee is recommended to evaluate for intra-articular extension. Electronically Signed   By: Marijo Conception, M.D.   On: 06/14/2018 20:23   Ct Head Wo Contrast  Result Date: 06/28/2018 CLINICAL DATA:  Dizziness with resultant fall. EXAM: CT HEAD WITHOUT CONTRAST TECHNIQUE: Contiguous axial images were obtained from the base of the skull through the vertex without intravenous contrast. COMPARISON:  03/04/2015 FINDINGS: Brain: Stable mild superficial and central atrophy with chronic minimal small vessel ischemic disease of periventricular white matter. No intra-axial mass nor extra-axial fluid collections. Midline fourth ventricle and basal cisterns without effacement. Brainstem and cerebellum are unremarkable. No acute intracranial hemorrhage or large vascular territory infarct is identified. Vascular: No hyperdense vessels. Atherosclerosis of the carotid siphons. Skull: Negative for fracture or suspicious osseous lesions. Sinuses/Orbits: No acute finding. Other: None. IMPRESSION: Stable mild atrophy and chronic small vessel ischemia. No acute intracranial abnormality. Electronically Signed   By: Ashley Royalty M.D.   On: 06/28/2018 23:49   Dg Knee Complete  4 Views Left  Result Date: 06/14/2018 CLINICAL DATA:  Fall with left knee pain and swelling EXAM: LEFT KNEE - COMPLETE 4+ VIEW COMPARISON:  None. FINDINGS: Large suprapatellar lipohemarthrosis. Medial tibial plateau vertical fracture with 3 mm medial displacement of the dominant fracture fragment. No dislocation. No suspicious focal osseous lesion. 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 No radiopaque foreign body. IMPRESSION: 1. Medial tibial plateau fracture with mild displacement. 2. Large suprapatellar lipohemarthrosis. 3. 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 Electronically Signed   By: Ilona Sorrel M.D.   On: 06/14/2018 20:23   Dg Hip Operative Unilat W Or W/o Pelvis Left  Result Date: 06/29/2018 CLINICAL DATA:  ORIF intertrochanteric left proximal femur fracture EXAM: OPERATIVE LEFT HIP (WITH PELVIS IF PERFORMED) 3 VIEWS TECHNIQUE: Fluoroscopic spot image(s) were submitted for interpretation post-operatively. COMPARISON:  06/28/2018 left hip radiographs FINDINGS: Fluoroscopy time 0 minutes 36 seconds. Three nondiagnostic spot fluoroscopic intraoperative left femur radiographs demonstrate transfixation of intertrochanteric left proximal femur fracture by left femoral neck pin with interlocking intramedullary rod in the left femoral shaft. IMPRESSION: Intraoperative fluoroscopic guidance for ORIF intertrochanteric left proximal femur fracture. Electronically Signed   By: Ilona Sorrel M.D.   On: 06/29/2018 15:38   Dg Hip Unilat W Or Wo Pelvis 2-3 Views Left  Result Date: 06/29/2018 CLINICAL DATA:  Left hip pain after a fall today. Also fell 2 weeks ago. EXAM: DG HIP (WITH OR WITHOUT PELVIS) 2-3V LEFT COMPARISON:  07/26/2016 FINDINGS: Acute inter trochanteric fracture of the proximal left femur without significant displacement. No dislocation of the left hip. Pelvis appears intact. SI joints and symphysis pubis are  not displaced. Degenerative changes in the lower lumbar spine and in both hips. Soft tissues are unremarkable. IMPRESSION: Acute posttraumatic intertrochanteric fracture of the proximal left femur. Electronically Signed   By: Lucienne Capers M.D.   On: 06/29/2018 00:38     CBC Recent Labs  Lab 06/29/18 0231 06/30/18 1043  WBC 13.4* 8.2  HGB 14.3 10.6*  HCT 42.3 31.8*  PLT 255 136*  MCV 85.6 88.1  MCH 28.9 29.4  MCHC 33.8 33.3  RDW 14.5 14.5    Chemistries  Recent Labs  Lab 06/29/18 0231 06/30/18 0631  NA 138 139  K 3.7 4.2  CL 104 104  CO2 23 26  GLUCOSE 176* 130*  BUN 23 11  CREATININE 0.77 0.65  CALCIUM 9.4 8.5*  AST 23 17  ALT 17 14  ALKPHOS 71 62  BILITOT 0.6 0.9   ------------------------------------------------------------------------------------------------------------------ estimated creatinine clearance is 56.7 mL/min (by C-G formula based on SCr of 0.65 mg/dL). ------------------------------------------------------------------------------------------------------------------ Recent Labs    06/29/18 0231  HGBA1C 5.5   ------------------------------------------------------------------------------------------------------------------ No results for input(s): CHOL, HDL, LDLCALC, TRIG, CHOLHDL, LDLDIRECT in the last 72 hours. ------------------------------------------------------------------------------------------------------------------ Recent Labs    06/29/18 0231  TSH 4.278   ------------------------------------------------------------------------------------------------------------------ No results for input(s): VITAMINB12, FOLATE, FERRITIN, TIBC, IRON, RETICCTPCT in the last 72 hours.  Coagulation profile No results for input(s): INR, PROTIME in the last 168 hours.  No results for input(s): DDIMER in the last 72 hours.  Cardiac Enzymes Recent Labs  Lab 06/29/18 0231  TROPONINI <0.03    ------------------------------------------------------------------------------------------------------------------ Invalid input(s): POCBNP    Assessment & Plan   This is a 77 year old female admitted for syncope. 1.    Possible syncope echo normal, tele with sinus tacy check ekg, tsh panel 2.  Hip fracture: s/p repair 3.  Hypothyroidism:  Continue Synthroid  4.  DVT prophylaxis: SCDs recommend Lovenox post procedure due to previous history of DVT 5.    Leukocytosis reactive now wbc decreased  Harrie Foreman, MD 06/29/2018, 2:46 AM     Code Status Orders  (From admission, onward)         Start     Ordered   06/29/18 0401  Full code  Continuous     06/29/18 0400        Code Status History    Date Active Date Inactive Code Status Order ID Comments User Context   03/04/2015 1013 03/07/2015 1330 Full Code [id number]  Bettey Costa, MD Inpatient           Consults ortho, cards  DVT Prophylaxis scds  Lab Results  Component Value Date   PLT 136 (L) 06/30/2018     Time Spent in minutes 29minGreater than 50% of time spent in care coordination and counseling patient regarding the condition and plan of care.   Dustin Flock M.D on 06/30/2018 at 1:09 PM  Between 7am to 6pm - Pager - [phone]  After 6pm go to [email or web address] - Proofreader  [employer]   Office  [phone]

## 2018-07-01 ENCOUNTER — Inpatient Hospital Stay: Payer: PPO

## 2018-07-01 DIAGNOSIS — M6281 Muscle weakness (generalized): Secondary | ICD-10-CM | POA: Diagnosis not present

## 2018-07-01 DIAGNOSIS — M1990 Unspecified osteoarthritis, unspecified site: Secondary | ICD-10-CM | POA: Diagnosis not present

## 2018-07-01 DIAGNOSIS — S72002D Fracture of unspecified part of neck of left femur, subsequent encounter for closed fracture with routine healing: Secondary | ICD-10-CM | POA: Diagnosis not present

## 2018-07-01 DIAGNOSIS — R000 Tachycardia, unspecified: Secondary | ICD-10-CM | POA: Diagnosis not present

## 2018-07-01 DIAGNOSIS — Z7401 Bed confinement status: Secondary | ICD-10-CM | POA: Diagnosis not present

## 2018-07-01 DIAGNOSIS — Z86711 Personal history of pulmonary embolism: Secondary | ICD-10-CM | POA: Diagnosis not present

## 2018-07-01 DIAGNOSIS — R262 Difficulty in walking, not elsewhere classified: Secondary | ICD-10-CM | POA: Diagnosis not present

## 2018-07-01 DIAGNOSIS — M25551 Pain in right hip: Secondary | ICD-10-CM | POA: Diagnosis not present

## 2018-07-01 DIAGNOSIS — E039 Hypothyroidism, unspecified: Secondary | ICD-10-CM | POA: Diagnosis not present

## 2018-07-01 DIAGNOSIS — S82115A Nondisplaced fracture of left tibial spine, initial encounter for closed fracture: Secondary | ICD-10-CM | POA: Diagnosis not present

## 2018-07-01 DIAGNOSIS — L03031 Cellulitis of right toe: Secondary | ICD-10-CM | POA: Diagnosis not present

## 2018-07-01 DIAGNOSIS — Z853 Personal history of malignant neoplasm of breast: Secondary | ICD-10-CM | POA: Diagnosis not present

## 2018-07-01 DIAGNOSIS — I82412 Acute embolism and thrombosis of left femoral vein: Secondary | ICD-10-CM | POA: Diagnosis not present

## 2018-07-01 DIAGNOSIS — I10 Essential (primary) hypertension: Secondary | ICD-10-CM | POA: Diagnosis not present

## 2018-07-01 DIAGNOSIS — S72002A Fracture of unspecified part of neck of left femur, initial encounter for closed fracture: Secondary | ICD-10-CM | POA: Diagnosis not present

## 2018-07-01 DIAGNOSIS — Z86718 Personal history of other venous thrombosis and embolism: Secondary | ICD-10-CM | POA: Diagnosis not present

## 2018-07-01 DIAGNOSIS — E785 Hyperlipidemia, unspecified: Secondary | ICD-10-CM | POA: Diagnosis not present

## 2018-07-01 DIAGNOSIS — K5900 Constipation, unspecified: Secondary | ICD-10-CM | POA: Diagnosis not present

## 2018-07-01 DIAGNOSIS — S72142D Displaced intertrochanteric fracture of left femur, subsequent encounter for closed fracture with routine healing: Secondary | ICD-10-CM | POA: Diagnosis not present

## 2018-07-01 DIAGNOSIS — I2699 Other pulmonary embolism without acute cor pulmonale: Secondary | ICD-10-CM | POA: Diagnosis not present

## 2018-07-01 DIAGNOSIS — R55 Syncope and collapse: Secondary | ICD-10-CM | POA: Diagnosis not present

## 2018-07-01 DIAGNOSIS — S7292XA Unspecified fracture of left femur, initial encounter for closed fracture: Secondary | ICD-10-CM | POA: Diagnosis not present

## 2018-07-01 DIAGNOSIS — M25552 Pain in left hip: Secondary | ICD-10-CM | POA: Diagnosis not present

## 2018-07-01 DIAGNOSIS — M25512 Pain in left shoulder: Secondary | ICD-10-CM | POA: Diagnosis not present

## 2018-07-01 LAB — CBC
HEMATOCRIT: 29.3 % — AB (ref 36.0–46.0)
Hemoglobin: 9.6 g/dL — ABNORMAL LOW (ref 12.0–15.0)
MCH: 29.1 pg (ref 26.0–34.0)
MCHC: 32.8 g/dL (ref 30.0–36.0)
MCV: 88.8 fL (ref 80.0–100.0)
NRBC: 0 % (ref 0.0–0.2)
Platelets: 129 10*3/uL — ABNORMAL LOW (ref 150–400)
RBC: 3.3 MIL/uL — ABNORMAL LOW (ref 3.87–5.11)
RDW: 14.3 % (ref 11.5–15.5)
WBC: 7.2 10*3/uL (ref 4.0–10.5)

## 2018-07-01 LAB — THYROID PANEL WITH TSH
FREE THYROXINE INDEX: 2.5 (ref 1.2–4.9)
T3 Uptake Ratio: 35 % (ref 24–39)
T4, Total: 7 ug/dL (ref 4.5–12.0)
TSH: 1.67 u[IU]/mL (ref 0.450–4.500)

## 2018-07-01 LAB — COMPREHENSIVE METABOLIC PANEL
ALT: 11 U/L (ref 0–44)
AST: 15 U/L (ref 15–41)
Albumin: 3.1 g/dL — ABNORMAL LOW (ref 3.5–5.0)
Alkaline Phosphatase: 51 U/L (ref 38–126)
Anion gap: 5 (ref 5–15)
BUN: 10 mg/dL (ref 8–23)
CHLORIDE: 105 mmol/L (ref 98–111)
CO2: 31 mmol/L (ref 22–32)
CREATININE: 0.58 mg/dL (ref 0.44–1.00)
Calcium: 8.3 mg/dL — ABNORMAL LOW (ref 8.9–10.3)
Glucose, Bld: 129 mg/dL — ABNORMAL HIGH (ref 70–99)
Potassium: 4.2 mmol/L (ref 3.5–5.1)
Sodium: 141 mmol/L (ref 135–145)
Total Bilirubin: 0.8 mg/dL (ref 0.3–1.2)
Total Protein: 5.7 g/dL — ABNORMAL LOW (ref 6.5–8.1)

## 2018-07-01 LAB — ANTITHROMBIN III: ANTITHROMB III FUNC: 104 % (ref 75–120)

## 2018-07-01 MED ORDER — OXYCODONE-ACETAMINOPHEN 7.5-325 MG PO TABS: 1.0000 | ORAL_TABLET | ORAL | 0 refills | Status: DC | PRN

## 2018-07-01 MED ORDER — FLEET ENEMA 7-19 GM/118ML RE ENEM: 1.0000 | ENEMA | Freq: Once | RECTAL | Status: DC

## 2018-07-01 MED ORDER — APIXABAN 5 MG PO TABS: 10.0000 mg | ORAL_TABLET | Freq: Two times a day (BID) | ORAL | Status: DC

## 2018-07-01 MED ORDER — ONDANSETRON HCL 4 MG PO TABS: 4.0000 mg | ORAL_TABLET | Freq: Four times a day (QID) | ORAL | 0 refills | Status: DC | PRN

## 2018-07-01 MED ORDER — APIXABAN 5 MG PO TABS: 5.0000 mg | ORAL_TABLET | Freq: Two times a day (BID) | ORAL | Status: DC

## 2018-07-01 MED ORDER — ENOXAPARIN SODIUM 40 MG/0.4ML ~~LOC~~ SOLN: 40.0000 mg | SUBCUTANEOUS | Status: DC

## 2018-07-01 MED ORDER — METOPROLOL TARTRATE 25 MG PO TABS: 12.5000 mg | ORAL_TABLET | Freq: Two times a day (BID) | ORAL | Status: DC

## 2018-07-01 MED ORDER — TRAMADOL HCL 50 MG PO TABS: ORAL_TABLET | ORAL | 0 refills | Status: DC

## 2018-07-01 MED ORDER — BISACODYL 10 MG RE SUPP
10.0000 mg | Freq: Once | RECTAL | Status: AC
  Administered 2018-07-01: 10 mg via RECTAL
  Filled 2018-07-01: qty 1

## 2018-07-01 MED ORDER — IOHEXOL 350 MG/ML SOLN
75.0000 mL | Freq: Once | INTRAVENOUS | Status: AC | PRN
  Administered 2018-07-01: 75 mL via INTRAVENOUS

## 2018-07-01 MED ORDER — DOCUSATE SODIUM 100 MG PO CAPS: 100.0000 mg | ORAL_CAPSULE | Freq: Two times a day (BID) | ORAL | 0 refills | Status: DC

## 2018-07-01 NOTE — Discharge Summary (Addendum)
[facility] - [place] at [facility], 77 y.o., DOB Aug 11, 1941, MRN [id number]. Admission date: 06/28/2018 Discharge Date 07/01/2018 Primary MD Luella Cook, NP Admitting Physician Harrie Foreman, MD  Admission Diagnosis  Closed displaced intertrochanteric fracture of left femur, initial encounter [facility]) [S72.142A]  Discharge Diagnosis   Active Problems: Acute left hip fracture status post TFNA PE/DVT Syncope Sinus tachycardia Hypothyroid Leukocytosis which was reactive Acute blood loss anemia related to surgery Recent tibial plateau fracture   Hospital Course Patient 77 year old who presented with fall which was felt to be due to possible syncope.  Patient recently had a tibial fracture.  Patient was admitted with and was seen by orthopedic surgery.  She underwent repair of the hip fracture.  For her syncope she was seen in consultation by cardiology had a telemetry place as well as echo of the heart.  Telemetry [name] only showed sinus tachycardia.  Patient will have a CT scan of the chest prior to discharge.    Nonweightbearing on the left lower extremity due to recent tibial plateau fracture, nonweightbearing until instructed by orthopedic surgery  eliquis 10mg  po bid x 7day, follow by eliquis 5mg  po bid x 5 days     Consults  orthopedic surgery  Significant Tests:  See full reports for all details     Dg Chest 1 View  Result Date: 06/29/2018 CLINICAL DATA:  Left hip pain after a fall today. History of breast cancer. EXAM: CHEST  1 VIEW COMPARISON:  03/04/2015 FINDINGS: Emphysematous changes in the lungs. Normal heart size and pulmonary vascularity. No focal airspace disease or consolidation in the lungs. No blunting of costophrenic angles. No pneumothorax. Mediastinal contours appear intact. IMPRESSION: Emphysematous changes in the lungs. No evidence of active pulmonary disease. Electronically Signed   By: Lucienne Capers M.D.   On:  06/29/2018 00:40   Dg Tibia/fibula Left  Result Date: 06/29/2018 CLINICAL DATA:  Pain after a fall today. EXAM: LEFT TIBIA AND FIBULA - 2 VIEW COMPARISON:  None. FINDINGS: Patient positioning limits the examination. Acute probably comminuted fractures of the proximal left tibial metaphysis with some impaction of fracture fragments. No obvious extension to the knee joint. Obliquity on the AP view limits evaluation of the tibial plateau and femoral condylar regions. Degenerative changes in the knee. Fibula appears intact. IMPRESSION: Acute posttraumatic fractures of the proximal left tibial metaphysis. Patient positioning limits evaluation of the knee joint. Electronically Signed   By: Lucienne Capers M.D.   On: 06/29/2018 00:39   Dg Tibia/fibula Left  Result Date: 06/14/2018 CLINICAL DATA:  Left knee pain after fall. EXAM: LEFT TIBIA AND FIBULA - 2 VIEW COMPARISON:  None. FINDINGS: The fibula appears normal. No soft tissue abnormality is noted. Mildly displaced longitudinal fracture is seen involving the medial portion of the proximal left tibia; intra-articular extension cannot be excluded. IMPRESSION: Mildly displaced longitudinal fracture seen involving the medial portion of proximal left tibia. CT scan of the left knee is recommended to evaluate for intra-articular extension. Electronically Signed   By: Marijo Conception, M.D.   On: 06/14/2018 20:23   Ct Head Wo Contrast  Result Date: 06/28/2018 CLINICAL DATA:  Dizziness with resultant fall. EXAM: CT HEAD WITHOUT CONTRAST TECHNIQUE: Contiguous axial images were obtained from the base of the skull through the vertex without intravenous contrast. COMPARISON:  03/04/2015 FINDINGS: Brain: Stable mild superficial and central atrophy with chronic minimal small vessel ischemic disease of periventricular white matter. No intra-axial mass nor extra-axial  fluid collections. Midline fourth ventricle and basal cisterns without effacement. Brainstem and cerebellum  are unremarkable. No acute intracranial hemorrhage or large vascular territory infarct is identified. Vascular: No hyperdense vessels. Atherosclerosis of the carotid siphons. Skull: Negative for fracture or suspicious osseous lesions. Sinuses/Orbits: No acute finding. Other: None. IMPRESSION: Stable mild atrophy and chronic small vessel ischemia. No acute intracranial abnormality. Electronically Signed   By: Ashley Royalty M.D.   On: 06/28/2018 23:49   Dg Knee Complete 4 Views Left  Result Date: 06/14/2018 CLINICAL DATA:  Fall with left knee pain and swelling EXAM: LEFT KNEE - COMPLETE 4+ VIEW COMPARISON:  None. FINDINGS: Large suprapatellar lipohemarthrosis. Medial tibial plateau vertical fracture with 3 mm medial displacement of the dominant fracture fragment. No dislocation. No suspicious focal osseous lesion. 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 No radiopaque foreign body. IMPRESSION: 1. Medial tibial plateau fracture with mild displacement. 2. Large suprapatellar lipohemarthrosis. 3. 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 Electronically Signed   By: Ilona Sorrel M.D.   On: 06/14/2018 20:23   Dg Hip Operative Unilat W Or W/o Pelvis Left  Result Date: 06/29/2018 CLINICAL DATA:  ORIF intertrochanteric left proximal femur fracture EXAM: OPERATIVE LEFT HIP (WITH PELVIS IF PERFORMED) 3 VIEWS TECHNIQUE: Fluoroscopic spot image(s) were submitted for interpretation post-operatively. COMPARISON:  06/28/2018 left hip radiographs FINDINGS: Fluoroscopy time 0 minutes 36 seconds. Three nondiagnostic spot fluoroscopic intraoperative left femur radiographs demonstrate transfixation of intertrochanteric left proximal femur fracture by left femoral neck pin with interlocking intramedullary rod in the left femoral shaft. IMPRESSION: Intraoperative fluoroscopic guidance for ORIF intertrochanteric left proximal femur fracture.  Electronically Signed   By: Ilona Sorrel M.D.   On: 06/29/2018 15:38   Dg Hip Unilat W Or Wo Pelvis 2-3 Views Left  Result Date: 06/29/2018 CLINICAL DATA:  Left hip pain after a fall today. Also fell 2 weeks ago. EXAM: DG HIP (WITH OR WITHOUT PELVIS) 2-3V LEFT COMPARISON:  07/26/2016 FINDINGS: Acute inter trochanteric fracture of the proximal left femur without significant displacement. No dislocation of the left hip. Pelvis appears intact. SI joints and symphysis pubis are not displaced. Degenerative changes in the lower lumbar spine and in both hips. Soft tissues are unremarkable. IMPRESSION: Acute posttraumatic intertrochanteric fracture of the proximal left femur. Electronically Signed   By: Lucienne Capers M.D.   On: 06/29/2018 00:38       Today   Subjective:   Debra Patel patient continues to have intermittent pain Objective:   Blood pressure 127/62, pulse (!) 104, temperature 98.5 F (36.9 C), temperature source Oral, resp. rate 16, height 5\' 4"  (1.626 m), weight 67.9 kg, SpO2 98 %.  .  Intake/Output Summary (Last 24 hours) at 07/01/2018 0901 Last data filed at 06/30/2018 1413 Gross per 24 hour  Intake 360 ml  Output -  Net 360 ml    Exam VITAL SIGNS: Blood pressure 127/62, pulse (!) 104, temperature 98.5 F (36.9 C), temperature source Oral, resp. rate 16, height 5\' 4"  (1.626 m), weight 67.9 kg, SpO2 98 %.  GENERAL:  77 y.o.-year-old patient lying in the bed with no acute distress.  EYES: Pupils equal, round, reactive to light and accommodation. No scleral icterus. Extraocular muscles intact.  HEENT: Head atraumatic, normocephalic. Oropharynx and nasopharynx clear.  NECK:  Supple, no jugular venous distention. No thyroid enlargement, no tenderness.  LUNGS: Normal breath sounds bilaterally, no wheezing, rales,rhonchi or crepitation. No use of accessory muscles of respiration.  CARDIOVASCULAR: S1, S2  normal. No murmurs, rubs, or gallops.  ABDOMEN: Soft, nontender,  nondistended. Bowel sounds present. No organomegaly or mass.  EXTREMITIES: No pedal edema, cyanosis, or clubbing.  NEUROLOGIC: Cranial nerves II through XII are intact. Muscle strength 5/5 in all extremities. Sensation intact. Gait not checked.  PSYCHIATRIC: The patient is alert and oriented x 3.  SKIN: No obvious rash, lesion, or ulcer.   Data Review     CBC w Diff:  Lab Results  Component Value Date   WBC 7.2 07/01/2018   HGB 9.6 (L) 07/01/2018   HGB 15.4 10/29/2012   HCT 29.3 (L) 07/01/2018   HCT 44.7 10/29/2012   PLT 129 (L) 07/01/2018   PLT 199 10/29/2012   LYMPHOPCT 28 02/06/2016   LYMPHOPCT 25.6 10/29/2012   MONOPCT 9 02/06/2016   MONOPCT 8.0 10/29/2012   EOSPCT 3 02/06/2016   EOSPCT 1.8 10/29/2012   BASOPCT 1 02/06/2016   BASOPCT 0.9 10/29/2012   CMP:  Lab Results  Component Value Date   NA 141 07/01/2018   NA 139 10/29/2012   K 4.2 07/01/2018   K 3.8 10/29/2012   CL 105 07/01/2018   CL 103 10/29/2012   CO2 31 07/01/2018   CO2 27 10/29/2012   BUN 10 07/01/2018   BUN 11 10/29/2012   CREATININE 0.58 07/01/2018   CREATININE 0.77 10/29/2012   PROT 5.7 (L) 07/01/2018   PROT 7.6 10/29/2012   ALBUMIN 3.1 (L) 07/01/2018   ALBUMIN 4.3 10/29/2012   BILITOT 0.8 07/01/2018   BILITOT 0.6 10/29/2012   ALKPHOS 51 07/01/2018   ALKPHOS 71 10/29/2012   AST 15 07/01/2018   AST 22 10/29/2012   ALT 11 07/01/2018   ALT 22 10/29/2012  .  Micro Results Recent Results (from the past 240 hour(s))  Surgical pcr screen     Status: None   Collection Time: 06/29/18  4:29 AM  Result Value Ref Range Status   MRSA, PCR NEGATIVE NEGATIVE Final   Staphylococcus aureus NEGATIVE NEGATIVE Final    Comment: (NOTE) The Xpert SA Assay (FDA approved for NASAL specimens in patients 4 years of age and older), is one component of a comprehensive surveillance program. It is not intended to diagnose infection nor to guide or monitor treatment. Performed at [facility], [address], [place], [place] [address]         Code Status Orders  (From admission, onward)         Start     Ordered   06/29/18 0401  Full code  Continuous     06/29/18 0400        Code Status History    Date Active Date Inactive Code Status Order ID Comments User Context   03/04/2015 1013 03/07/2015 1330 Full Code [id number]  Bettey Costa, MD Inpatient          Contact information for after-discharge care    Destination    [facility] Preferred SNF .   Service:  Skilled Nursing Contact information: [address] [place] [phone]              Discharge Medications   Allergies as of 07/01/2018      Reactions   Pyridoxine Anaphylaxis   Hydrocodone Nausea Only, Nausea And Vomiting   Alpha-gal    Bee Venom Other (See Comments)   Motrin [ibuprofen] Other (See Comments)   depression   Oxycodone Nausea Only   Latex Rash   Nickel Rash  Medication List    STOP taking these medications   apixaban 5 MG Tabs tablet Commonly known as:  ELIQUIS     TAKE these medications   acetaminophen 500 MG tablet Commonly known as:  TYLENOL Take 1,000 mg by mouth every 6 (six) hours as needed for mild pain.   alendronate 70 MG tablet Commonly known as:  FOSAMAX Take 70 mg by mouth once a week. Take with a full glass of water on an empty stomach.   Calcium Carbonate-Vitamin D 600-400 MG-UNIT tablet Take 1 tablet by mouth daily.   docusate sodium 100 MG capsule Commonly known as:  COLACE Take 1 capsule (100 mg total) by mouth 2 (two) times daily.   enoxaparin 40 MG/0.4ML injection Commonly known as:  LOVENOX Inject 0.4 mLs (40 mg total) into the skin daily for 21 days.   levothyroxine 50 MCG tablet Commonly known as:  SYNTHROID, LEVOTHROID Take 50 mcg by mouth every morning. (brand name only)   metoprolol tartrate 25 MG tablet Commonly known as:  LOPRESSOR Take 0.5 tablets (12.5 mg total) by mouth 2 (two)  times daily.   ondansetron 4 MG tablet Commonly known as:  ZOFRAN Take 1 tablet (4 mg total) by mouth every 6 (six) hours as needed for nausea.   oxyCODONE-acetaminophen 7.5-325 MG tablet Commonly known as:  PERCOCET Take 1 tablet by mouth every 4 (four) hours as needed for severe pain.   traMADol 50 MG tablet Commonly known as:  ULTRAM 1-2 tabs po q 8 hours prn What changed:    how much to take  how to take this  when to take this  reasons to take this  additional instructions          Total Time in preparing paper work, data evaluation and todays exam - 61 minutes  Dustin Flock M.D on 07/01/2018 at 9:01 [place]  [phone]

## 2018-07-01 NOTE — Progress Notes (Signed)
Subjective:  POD #2 S/p IM fixation for IT hip fracture.Patient reports mild pain at rest and marked pain with any hip movement.  Patient also has left proximal tibia fracture sustained on 06/14/18.  Patient diagnosed earlier today with a right lower lobe PE without heart strain.  Patient now on eliquis.     Objective:   VITALS:   Vitals:   06/30/18 1557 07/01/18 0224 07/01/18 0815 07/01/18 1526  BP: 117/61 127/62 129/81 113/67  Pulse: (!) 111 (!) 104 (!) 110 (!) 112  Resp:  16 16   Temp: 99.9 F (37.7 C) 98.5 F (36.9 C) 97.9 F (36.6 C) 98.8 F (37.1 C)  TempSrc: Oral Oral Oral Oral  SpO2: 97% 98% 96% 96%  Weight:      Height:        PHYSICAL EXAM: Left lower extremity: Neurovascular intact Sensation intact distally Intact pulses distally Dorsiflexion/Plantar flexion intact Incision: dressing C/D/I No cellulitis present Compartment soft  LABS  Results for orders placed or performed during the hospital encounter of 06/28/18 (from the past 24 hour(s))  Comprehensive metabolic panel     Status: Abnormal   Collection Time: 07/01/18  5:02 AM  Result Value Ref Range   Sodium 141 135 - 145 mmol/L   Potassium 4.2 3.5 - 5.1 mmol/L   Chloride 105 98 - 111 mmol/L   CO2 31 22 - 32 mmol/L   Glucose, Bld 129 (H) 70 - 99 mg/dL   BUN 10 8 - 23 mg/dL   Creatinine, Ser 0.58 0.44 - 1.00 mg/dL   Calcium 8.3 (L) 8.9 - 10.3 mg/dL   Total Protein 5.7 (L) 6.5 - 8.1 g/dL   Albumin 3.1 (L) 3.5 - 5.0 g/dL   AST 15 15 - 41 U/L   ALT 11 0 - 44 U/L   Alkaline Phosphatase 51 38 - 126 U/L   Total Bilirubin 0.8 0.3 - 1.2 mg/dL   GFR calc non Af Amer >60 >60 mL/min   GFR calc Af Amer >60 >60 mL/min   Anion gap 5 5 - 15  CBC     Status: Abnormal   Collection Time: 07/01/18  5:02 AM  Result Value Ref Range   WBC 7.2 4.0 - 10.5 K/uL   RBC 3.30 (L) 3.87 - 5.11 MIL/uL   Hemoglobin 9.6 (L) 12.0 - 15.0 g/dL   HCT 29.3 (L) 36.0 - 46.0 %   MCV 88.8 80.0 - 100.0 fL   MCH 29.1 26.0 - 34.0 pg    MCHC 32.8 30.0 - 36.0 g/dL   RDW 14.3 11.5 - 15.5 %   Platelets 129 (L) 150 - 400 K/uL   nRBC 0.0 0.0 - 0.2 %    Ct Angio Chest Pe W Or Wo Contrast  Result Date: 07/01/2018 CLINICAL DATA:  Syncope. History of breast carcinoma. Reported history of previous pulmonary embolus. EXAM: CT ANGIOGRAPHY CHEST WITH CONTRAST TECHNIQUE: Multidetector CT imaging of the chest was performed using the standard protocol during bolus administration of intravenous contrast. Multiplanar CT image reconstructions and MIPs were obtained to evaluate the vascular anatomy. CONTRAST:  52mL OMNIPAQUE IOHEXOL 350 MG/ML SOLN COMPARISON:  Chest CT March 03, 2016; chest radiograph June 28, 2018 FINDINGS: Cardiovascular: There is pulmonary embolus in the proximal right lower lobe pulmonary artery extending into the anterior and lateral right segmental lower lobe branches. A pulmonary embolus is also noted at the origin of the posterior segment right lower lobe branch. No pulmonary emboli elsewhere noted. No right heart strain.  There is no thoracic aortic aneurysm or dissection. There is modest calcification in the proximal left subclavian artery. Visualized great vessels otherwise appear unremarkable. There is aortic atherosclerosis. There is a small pericardial effusion. No pericardial thickening evident. Mediastinum/Nodes: Patient has had surgical removal of the isthmus and right lobe of the thyroid. Left lobe of the thyroid appears normal. There is no appreciable adenopathy in thoracic region. No esophageal lesions are evident. Lungs/Pleura: There is atelectatic change in the lung bases. There is no lung edema or consolidation. No pulmonary nodular lesions are evident. There is no appreciable pleural effusion or pleural thickening. Upper Abdomen: There is an apparent cyst in the dome of the liver on the right medially measuring 1.1 x 1.0 cm. Visualized upper abdominal structures otherwise appear unremarkable. Musculoskeletal: Patient  is status post left mastectomy. There are foci of degenerative change in the thoracic spine. There are no blastic or lytic bone lesions. There is no chest wall lesion. Review of the MIP images confirms the above findings. IMPRESSION: 1. Right lower lobe pulmonary emboli. No right heart strain evident. No more central pulmonary emboli evident. 2. No thoracic aortic aneurysm or dissection. There is aortic atherosclerosis. There is calcification at the origin of the left subclavian artery, not causing hemodynamically significant obstruction. 3. Bibasilar atelectasis. No lung edema or consolidation. No parenchymal lung nodular lesion. 4.  No evident thoracic adenopathy. 5.  Small pericardial effusion. 6. Status post surgical removal of the right lobe and isthmus of the thyroid. 6.  Left breast surgically absent. Aortic Atherosclerosis (ICD10-I70.0). Critical Value/emergent results were called by telephone at the time of interpretation on 07/01/2018 at 10:15 am to Dr. Dustin Flock , who verbally acknowledged these results. Electronically Signed   By: Lowella Grip III M.D.   On: 07/01/2018 10:15   US Venous Img Lower Bilateral  Result Date: 07/01/2018 CLINICAL DATA:  Swelling, pain, recent fracture left proximal femur. Pulmonary emboli on CT chest EXAM: BILATERAL LOWER EXTREMITY VENOUS DOPPLER ULTRASOUND TECHNIQUE: Gray-scale sonography with compression, as well as color and duplex ultrasound, were performed to evaluate the deep venous system from the level of the common femoral vein through the popliteal and proximal calf veins. COMPARISON:  03/06/2015 FINDINGS: On the left, there is incompletely occlusive noncompressible thrombus in the mid femoral vein. Proximal and distal segments appear widely patent. Common femoral, popliteal, and visualized calf veins unremarkable. On the right, normal compressibility of the common femoral, superficial femoral, and popliteal veins, as well as the proximal calf veins. No  filling defects to suggest DVT on grayscale or color Doppler imaging. Doppler waveforms show normal direction of venous flow, normal respiratory phasicity and response to augmentation. IMPRESSION: 1. POSITIVE for incompletely occlusive DVT, mid left femoral vein. Electronically Signed   By: Lucrezia Europe M.D.   On: 07/01/2018 12:25    Assessment/Plan: 2 Days Post-Op   Active Problems:   Syncope  Patient will be discharged to SNF today.  She will follow up with Dr. Harlow Mares in approximately 10-14 days.  Patient should follow up with Dr. Lucky Cowboy or Schnier in vascular surgery for follow-up and management of her PE.  I am ordering a knee immobilizer for the left tibia fracture.  I recommend she wear this at all times except to wash until she follows up with Dr. Harlow Mares.    Thornton Park , MD 07/01/2018, 4:33 PM

## 2018-07-01 NOTE — Consult Note (Signed)
ANTICOAGULATION CONSULT NOTE - Initial Consult  Pharmacy Consult for apixaban Indication: pulmonary embolus  Allergies  Allergen Reactions  . Pyridoxine Anaphylaxis  . Hydrocodone Nausea Only and Nausea And Vomiting  . Alpha-Gal   . Bee Venom Other (See Comments)  . Motrin [Ibuprofen] Other (See Comments)    depression  . Oxycodone Nausea Only  . Latex Rash  . Nickel Rash    Patient Measurements: Height: 5\' 4"  (162.6 cm) Weight: 149 lb 11.1 oz (67.9 kg) IBW/kg (Calculated) : 54.7 Heparin Dosing Weight:   Vital Signs: Temp: 97.9 F (36.6 C) (10/11 0815) Temp Source: Oral (10/11 0815) BP: 129/81 (10/11 0815) Pulse Rate: 110 (10/11 0815)  Labs: Recent Labs    06/29/18 0231 06/30/18 0631 06/30/18 1043 07/01/18 0502  HGB 14.3  --  10.6* 9.6*  HCT 42.3  --  31.8* 29.3*  PLT 255  --  136* 129*  CREATININE 0.77 0.65  --  0.58  TROPONINI <0.03  --   --   --     Estimated Creatinine Clearance: 56.7 mL/min (by C-G formula based on SCr of 0.58 mg/dL).   Medical History: Past Medical History:  Diagnosis Date  . BP (high blood pressure) 08/21/2014  . Brain infection   . Breast cancer ([place]) 2001   Left side mastectomy  . Personal history of chemotherapy 2001   BREAST CA  . Personal history of radiation therapy 2001   BREAST CA  . Radiation 2001   BREAST CA  . S/P chemotherapy, time since greater than 12 weeks 2001   BREAST CA  . Tachycardia   . Thyroid disease   . Tick fever     Medications:  Scheduled:  . apixaban  10 mg Oral BID   Followed by  . [START ON 07/08/2018] apixaban  5 mg Oral BID  . calcium-vitamin D  1 tablet Oral Daily  . docusate sodium  100 mg Oral BID  . levothyroxine  50 mcg Oral QAC breakfast  . metoprolol tartrate  12.5 mg Oral BID    Assessment: Patient is a 77 year old female with a PMH of PE-presents after a fall thought to be due to syncope. Found to have a PE on CT. Pt is post op day 2 from tibial fracture. Was on lovenox for  DVT prophylaxis- last dose last night  Goal of Therapy:   Monitor platelets by anticoagulation protocol: Yes   Plan:  apixaban 10mg  BID x 7 days then 5mg  BID  Melissa D Maccia, Pharm.D, BCPS Clinical Pharmacist 07/01/2018,11:11 AM

## 2018-07-01 NOTE — Progress Notes (Addendum)
Patient is medically stable for D/C to [facility] today. Per Otila Kluver [facility] liaison patient can come today to room 806. [facility] authorization has been received. RN will call report and arrange EMS for transport. Clinical Education officer, museum (CSW) sent D/C orders to [facility] via [facility]. Patient is aware of above. Patient's son Wille Glaser is at bedside and aware of above. Please reconsult if future social work needs arise. CSW signing off.   CSW sent updated D/C summary to [facility] today via [facility].   [employer], LCSW [phone]

## 2018-07-01 NOTE — Progress Notes (Signed)
PT Cancellation Note  Patient Details Name: Debra Patel MRN: [id number] DOB: 08-Jan-1941   Cancelled Treatment:    Reason Eval/Treat Not Completed: Medical issues which prohibited therapy:  Pt found to have a PE on CT scan this date.  Per protocol, will hold PT services for a minimum of 48 hours from the time and date of initiation of anticoagulation medication.     Linus Salmons PT, DPT 07/01/18, 11:57 AM

## 2018-07-01 NOTE — Clinical Social Work Placement (Signed)
   CLINICAL SOCIAL WORK PLACEMENT  NOTE  Date:  07/01/2018  Patient Details  Name: Debra Patel MRN: [id number] Date of Birth: Mar 02, 1941  Clinical Social Work is seeking post-discharge placement for this patient at the [place] level of care (*CSW will initial, date and re-position this form in  chart as items are completed):  Yes   Patient/family provided with [place] Work Department's list of facilities offering this level of care within the geographic area requested by the patient (or if unable, by the patient's family).  Yes   Patient/family informed of their freedom to choose among providers that offer the needed level of care, that participate in Medicare, Medicaid or managed care program needed by the patient, have an available bed and are willing to accept the patient.  Yes   Patient/family informed of [place]'s ownership interest in [facility] and [facility] as well as of the fact that they are under no obligation to receive care at these facilities.  PASRR submitted to EDS on 06/29/18     PASRR number received on 06/29/18     Existing PASRR number confirmed on       FL2 transmitted to all facilities in geographic area requested by pt/family on 06/29/18     FL2 transmitted to all facilities within larger geographic area on       Patient informed that his/her managed care company has contracts with or will negotiate with certain facilities, including the following:        Yes   Patient/family informed of bed offers received.  [facility] )     Physician recommends and patient chooses bed at      Patient to be transferred to ([facility] ) on 07/01/18.  Patient to be transferred to facility by [facility] )     Patient family notified on 07/01/18 of transfer.  Name of family member notified:  (Patient's son Wille Glaser is aware of D/C today. )     PHYSICIAN       Additional Comment:     _______________________________________________ Karol Skarzynski, [name], LCSW 07/01/2018, 11:08 AM

## 2018-07-01 NOTE — Progress Notes (Signed)
Patient is ready for discharge. Knee immobilizer in place. Report called. Signed scripts and paperwork in packet. Waiting for EMS at this time.

## 2018-07-01 NOTE — Progress Notes (Signed)
PT Cancellation Note  Patient Details Name: Debra Patel MRN: [id number] DOB: 1940/11/26   Cancelled Treatment:    Reason Eval/Treat Not Completed: Patient declined PT services this AM secondary to LLE pain exacerbated during a CT scan this date when L knee was bumped against a "bar" on the CT table, nursing informed.  Will attempt to see pt at a future date/time as medically appropriate.     Linus Salmons PT, DPT 07/01/18, 10:02 AM

## 2018-07-01 NOTE — Discharge Instructions (Signed)
Information on my medicine - ELIQUIS (apixaban)  This medication education was reviewed with me or my healthcare representative as part of my discharge preparation.  The pharmacist that spoke with me during my hospital stay was:  Ramond Dial, [facility]  Why was Eliquis prescribed for you? Eliquis was prescribed to treat blood clots that may have been found in the veins of your legs (deep vein thrombosis) or in your lungs (pulmonary embolism) and to reduce the risk of them occurring again.  What do You need to know about Eliquis ? The starting dose is 10 mg (two 5 mg tablets) taken TWICE daily for the FIRST SEVEN (7) DAYS, then on (enter date)  07/08/18  the dose is reduced to ONE 5 mg tablet taken TWICE daily.  Eliquis may be taken with or without food.   Try to take the dose about the same time in the morning and in the evening. If you have difficulty swallowing the tablet whole please discuss with your pharmacist how to take the medication safely.  Take Eliquis exactly as prescribed and DO NOT stop taking Eliquis without talking to the doctor who prescribed the medication.  Stopping may increase your risk of developing a new blood clot.  Refill your prescription before you run out.  After discharge, you should have regular check-up appointments with your healthcare provider that is prescribing your Eliquis.    What do you do if you miss a dose? If a dose of ELIQUIS is not taken at the scheduled time, take it as soon as possible on the same day and twice-daily administration should be resumed. The dose should not be doubled to make up for a missed dose.  Important Safety Information A possible side effect of Eliquis is bleeding. You should call your healthcare provider right away if you experience any of the following: ? Bleeding from an injury or your nose that does not stop. ? Unusual colored urine (red or dark brown) or unusual colored stools (red or black). ? Unusual bruising  for unknown reasons. ? A serious fall or if you hit your head (even if there is no bleeding).  Some medicines may interact with Eliquis and might increase your risk of bleeding or clotting while on Eliquis. To help avoid this, consult your healthcare provider or pharmacist prior to using any new prescription or non-prescription medications, including herbals, vitamins, non-steroidal anti-inflammatory drugs (NSAIDs) and supplements.  This website has more information on Eliquis (apixaban): http://www.eliquis.com/eliquis/home

## 2018-07-01 NOTE — Care Management Important Message (Signed)
Copy of signed IM left with patient in room.  

## 2018-07-01 NOTE — Progress Notes (Signed)
Clinical Social Worker (CSW) met with patient and her son Wille Glaser to get SNF choice. They chose [facility]. [facility] liaison is aware of accepted bed offer.   [employer], LCSW [phone]

## 2018-07-02 LAB — LUPUS ANTICOAGULANT PANEL
DRVVT: 32.1 s (ref 0.0–47.0)
PTT LA: 39.6 s (ref 0.0–51.9)

## 2018-07-02 LAB — PROTEIN C ACTIVITY: Protein C Activity: 117 % (ref 73–180)

## 2018-07-02 LAB — PROTEIN S, TOTAL: PROTEIN S AG TOTAL: 74 % (ref 60–150)

## 2018-07-02 LAB — HOMOCYSTEINE: HOMOCYSTEINE-NORM: 12.2 umol/L (ref 0.0–15.0)

## 2018-07-02 LAB — PROTEIN S ACTIVITY: Protein S Activity: 54 % — ABNORMAL LOW (ref 63–140)

## 2018-07-03 DIAGNOSIS — R000 Tachycardia, unspecified: Secondary | ICD-10-CM | POA: Diagnosis not present

## 2018-07-03 DIAGNOSIS — Z86718 Personal history of other venous thrombosis and embolism: Secondary | ICD-10-CM | POA: Diagnosis not present

## 2018-07-03 DIAGNOSIS — E039 Hypothyroidism, unspecified: Secondary | ICD-10-CM | POA: Diagnosis not present

## 2018-07-03 DIAGNOSIS — K5900 Constipation, unspecified: Secondary | ICD-10-CM | POA: Diagnosis not present

## 2018-07-03 DIAGNOSIS — Z86711 Personal history of pulmonary embolism: Secondary | ICD-10-CM | POA: Diagnosis not present

## 2018-07-03 DIAGNOSIS — S72002D Fracture of unspecified part of neck of left femur, subsequent encounter for closed fracture with routine healing: Secondary | ICD-10-CM | POA: Diagnosis not present

## 2018-07-04 LAB — BETA-2-GLYCOPROTEIN I ABS, IGG/M/A: Beta-2-Glycoprotein I IgA: 14 GPI IgA units (ref 0–25)

## 2018-07-05 ENCOUNTER — Other Ambulatory Visit: Payer: Self-pay | Admitting: *Deleted

## 2018-07-05 LAB — CARDIOLIPIN ANTIBODIES, IGG, IGM, IGA
ANTICARDIOLIPIN IGM: 9 [MPL'U]/mL (ref 0–12)
Anticardiolipin IgG: <9 GPL U/mL (ref 0–14)

## 2018-07-05 NOTE — Patient Outreach (Signed)
[facility]) Care Management  07/05/2018  Debra Patel 11/30/1940 [id number]   Attended discharge planning meeting at [facility] with [place] Team Member [place]. PT stated patient has been progressing well since admission and is motivated to work hard in therapy.  PT stated patient has been able to hop on well leg with walker.  SW stated patient has a supportive family and church family.  SW stated son and daughter are involved but live out of town.   Met with patient at the bedside with [facility] UM Nurse Marlowe Kays.  Marlowe Kays explained medicare days and co-pay days to patient.  Patient stated her spouse passed away 11 years ago and she was in the process of down sizing her home.  Patient stated she not only had fallen and hurt her femur she had previously fallen an fractured her knee.  Patient's current discharge plan is to return home, but is unsure if she will need in home care at discharge.  [place] [facility] services and gave patient [facility] packet to look over with contact info.   Plan to visit patient next week at facility visit and monitor for [facility] CM needs.   Rutherford Limerick RN, BSN [place] Acute Care Coordinator [phone]) Business Mobile [phone]) Toll free office

## 2018-07-06 LAB — PROTEIN C, TOTAL: Protein C, Total: 94 % (ref 60–150)

## 2018-07-07 LAB — PROTHROMBIN GENE MUTATION

## 2018-07-08 LAB — FACTOR 5 LEIDEN

## 2018-07-11 DIAGNOSIS — Z86718 Personal history of other venous thrombosis and embolism: Secondary | ICD-10-CM | POA: Diagnosis not present

## 2018-07-11 DIAGNOSIS — Z86711 Personal history of pulmonary embolism: Secondary | ICD-10-CM | POA: Diagnosis not present

## 2018-07-11 DIAGNOSIS — R000 Tachycardia, unspecified: Secondary | ICD-10-CM | POA: Diagnosis not present

## 2018-07-11 DIAGNOSIS — Z853 Personal history of malignant neoplasm of breast: Secondary | ICD-10-CM | POA: Diagnosis not present

## 2018-07-11 DIAGNOSIS — S72002D Fracture of unspecified part of neck of left femur, subsequent encounter for closed fracture with routine healing: Secondary | ICD-10-CM | POA: Diagnosis not present

## 2018-07-11 DIAGNOSIS — K5900 Constipation, unspecified: Secondary | ICD-10-CM | POA: Diagnosis not present

## 2018-07-11 DIAGNOSIS — E039 Hypothyroidism, unspecified: Secondary | ICD-10-CM | POA: Diagnosis not present

## 2018-07-12 ENCOUNTER — Other Ambulatory Visit: Payer: Self-pay | Admitting: *Deleted

## 2018-07-12 NOTE — Patient Outreach (Signed)
[facility]) Care Management  07/12/2018  Debra Patel 1941-06-07 [id number]   Attended interdisciplinary team meeting at [facility] with [place] team member Marlowe Kays.  PT stating patient continuing to make progress.  PT stated patient thought she was ready to go home last weekend but then decided to stay.  Social worker states patient has good support from family and church family.    Met with patient and patient's daughter at bedside.  Patient stating she feels she would feel more comfortable about leaving SNF in another week or so. Patient states at this point she is unsure of her discharge needs at this time.   Plan to visit with patient next week closer to discharge date to assess CM needs.   Rutherford Limerick RN, BSN [place] Acute Care Coordinator [phone]) Business Mobile [phone]) Toll free office

## 2018-07-14 ENCOUNTER — Inpatient Hospital Stay: Payer: PPO | Admitting: Hematology and Oncology

## 2018-07-14 DIAGNOSIS — S82115A Nondisplaced fracture of left tibial spine, initial encounter for closed fracture: Secondary | ICD-10-CM | POA: Diagnosis not present

## 2018-07-14 DIAGNOSIS — M25512 Pain in left shoulder: Secondary | ICD-10-CM | POA: Diagnosis not present

## 2018-07-14 DIAGNOSIS — S72002A Fracture of unspecified part of neck of left femur, initial encounter for closed fracture: Secondary | ICD-10-CM | POA: Diagnosis not present

## 2018-07-15 ENCOUNTER — Inpatient Hospital Stay: Payer: PPO | Admitting: Hematology and Oncology

## 2018-07-20 ENCOUNTER — Other Ambulatory Visit: Payer: Self-pay | Admitting: *Deleted

## 2018-07-20 DIAGNOSIS — L03031 Cellulitis of right toe: Secondary | ICD-10-CM | POA: Diagnosis not present

## 2018-07-20 NOTE — Patient Outreach (Signed)
[place]) Care Management  07/20/2018  Debra Patel 1941-08-30 [id number]   Attended interdisciplinary team discharge meeting at [facility] with [place] team member Marlowe Kays. PT states patient continues to do well in therapy and is working on stairs.  PT states patient is up to 50% WB status and is walking 151f with supervision.   Met with patient in her room .  Patient states she has all needed equipment at her home.  Patient states she has transportation as needed. Patient states she has no medication attainability issues. Patient states she has a MD appointment to look at her toe related to it "looks infected" to her and has been hurting.  Patient states she feels good about her progress but wants to stay at the SNF another week or so to get comfortable with stairs.  Patient states she is having life alert system put in her home this week.  Patient has no CM needs at this time.  Patient will be enrolled with [facility]'s Advancing to Home program at discharge.   Patient has [facility] CM contact information if needs arise.  JRutherford LimerickRN, BSN [place] Care Coordinator [phone] Business Mobile [phone] Toll free office

## 2018-07-21 DIAGNOSIS — E039 Hypothyroidism, unspecified: Secondary | ICD-10-CM | POA: Diagnosis not present

## 2018-07-21 DIAGNOSIS — M25552 Pain in left hip: Secondary | ICD-10-CM | POA: Diagnosis not present

## 2018-07-21 DIAGNOSIS — Z86711 Personal history of pulmonary embolism: Secondary | ICD-10-CM | POA: Diagnosis not present

## 2018-07-21 DIAGNOSIS — K5900 Constipation, unspecified: Secondary | ICD-10-CM | POA: Diagnosis not present

## 2018-07-21 DIAGNOSIS — R000 Tachycardia, unspecified: Secondary | ICD-10-CM | POA: Diagnosis not present

## 2018-07-21 DIAGNOSIS — Z86718 Personal history of other venous thrombosis and embolism: Secondary | ICD-10-CM | POA: Diagnosis not present

## 2018-07-22 ENCOUNTER — Inpatient Hospital Stay: Payer: PPO | Admitting: Hematology and Oncology

## 2018-07-28 DIAGNOSIS — J439 Emphysema, unspecified: Secondary | ICD-10-CM | POA: Diagnosis not present

## 2018-07-28 DIAGNOSIS — M160 Bilateral primary osteoarthritis of hip: Secondary | ICD-10-CM | POA: Diagnosis not present

## 2018-07-28 DIAGNOSIS — Z853 Personal history of malignant neoplasm of breast: Secondary | ICD-10-CM | POA: Diagnosis not present

## 2018-07-28 DIAGNOSIS — M1712 Unilateral primary osteoarthritis, left knee: Secondary | ICD-10-CM | POA: Diagnosis not present

## 2018-07-28 DIAGNOSIS — W19XXXD Unspecified fall, subsequent encounter: Secondary | ICD-10-CM | POA: Diagnosis not present

## 2018-07-28 DIAGNOSIS — I2699 Other pulmonary embolism without acute cor pulmonale: Secondary | ICD-10-CM | POA: Diagnosis not present

## 2018-07-28 DIAGNOSIS — H2513 Age-related nuclear cataract, bilateral: Secondary | ICD-10-CM | POA: Diagnosis not present

## 2018-07-28 DIAGNOSIS — S82252D Displaced comminuted fracture of shaft of left tibia, subsequent encounter for closed fracture with routine healing: Secondary | ICD-10-CM | POA: Diagnosis not present

## 2018-07-28 DIAGNOSIS — S72142D Displaced intertrochanteric fracture of left femur, subsequent encounter for closed fracture with routine healing: Secondary | ICD-10-CM | POA: Diagnosis not present

## 2018-07-28 DIAGNOSIS — D62 Acute posthemorrhagic anemia: Secondary | ICD-10-CM | POA: Diagnosis not present

## 2018-07-28 DIAGNOSIS — R030 Elevated blood-pressure reading, without diagnosis of hypertension: Secondary | ICD-10-CM | POA: Diagnosis not present

## 2018-08-02 ENCOUNTER — Other Ambulatory Visit: Payer: Self-pay | Admitting: *Deleted

## 2018-08-02 NOTE — Patient Outreach (Signed)
[facility]) Care Management  08/02/2018  Debra Patel September 12, 1941 [id number]   Transition of Care Referral   Referral Date: 07/29/18 Referral Source: HTA IP discharge Date of Admission: 07/01/18 Diagnosis: s/p surgical repair of left hip fx (06/28/18)  Date of Discharge:on 07/27/18. Facility:  [facility] [place]  Insurance: HTA   Outreach attempt # 1 No answer. [facility] RN CM left HIPAA compliant voicemail message along with CM's contact info.   Plan: [facility] RN CM sent an unsuccessful outreach letter and scheduled this patient for another call attempt within 4 business days   Kimberly L. Lavina Hamman, RN, BSN, [place] Management Care Coordinator Direct Number [phone] Mobile number [phone]  Main [facility] number [phone] Fax number [phone]

## 2018-08-04 ENCOUNTER — Other Ambulatory Visit: Payer: Self-pay | Admitting: *Deleted

## 2018-08-04 DIAGNOSIS — Z23 Encounter for immunization: Secondary | ICD-10-CM | POA: Diagnosis not present

## 2018-08-04 DIAGNOSIS — D508 Other iron deficiency anemias: Secondary | ICD-10-CM | POA: Diagnosis not present

## 2018-08-04 DIAGNOSIS — Z09 Encounter for follow-up examination after completed treatment for conditions other than malignant neoplasm: Secondary | ICD-10-CM | POA: Diagnosis not present

## 2018-08-04 NOTE — Patient Outreach (Addendum)
  [place]) Care Management  08/04/2018  CYNTHEA ZACHMAN 07-Jul-1941 [id number]   Transition of Care Referral  Referral Date: 07/29/18 Referral Source: HTA IP discharge Date of Admission: 07/01/18 Diagnosis: s/p surgical repair of left hip fx (06/28/18)  Date of Discharge:on 07/27/18. Facility:  [facility] [place]  Insurance: HTA   Outreach attempt # 2 No answer. [facility] RN CM left HIPAA compliant voicemail message along with CM's contact info.   Plan: [facility] RN  scheduled this patient for final call attempt within 4 business days   Kimberly L. Lavina Hamman, RN, BSN, [place] Management Care Coordinator Direct Number [phone] Mobile number [phone]  Main [facility] number [phone] Fax number [phone]

## 2018-08-09 ENCOUNTER — Other Ambulatory Visit: Payer: Self-pay | Admitting: *Deleted

## 2018-08-09 NOTE — Patient Outreach (Signed)
[facility] [facility]) Care Management  08/09/2018  MISHAWN DIDION [birth year] [id number]   Transition of Care Referral  Referral Date:07/29/18 Referral Source:HTA IP discharge Date of Admission:07/01/18 Diagnosis:s/p surgical repair of left hip fx (06/28/18) Date of Discharge:on 07/27/18. [place]  Outreach attempt # 3 Patient answered but informed CM she was tied up with someone at this time and could not speak with CM. She disconnecetd prior to getting CM contact information   Plan: [facility] RN CM  scheduled this patient for case closure within 4 business days if no return call to [place]. Lavina Hamman, RN, BSN, [place] Management Care Coordinator Direct Number [phone] Mobile number [phone]  Main [facility] number [phone] Fax number [phone]

## 2018-08-11 DIAGNOSIS — S72002D Fracture of unspecified part of neck of left femur, subsequent encounter for closed fracture with routine healing: Secondary | ICD-10-CM | POA: Diagnosis not present

## 2018-08-11 DIAGNOSIS — M25562 Pain in left knee: Secondary | ICD-10-CM | POA: Diagnosis not present

## 2018-08-11 DIAGNOSIS — S82115D Nondisplaced fracture of left tibial spine, subsequent encounter for closed fracture with routine healing: Secondary | ICD-10-CM | POA: Diagnosis not present

## 2018-08-12 DIAGNOSIS — M25562 Pain in left knee: Secondary | ICD-10-CM | POA: Diagnosis not present

## 2018-08-12 DIAGNOSIS — M25552 Pain in left hip: Secondary | ICD-10-CM | POA: Diagnosis not present

## 2018-08-12 DIAGNOSIS — M6281 Muscle weakness (generalized): Secondary | ICD-10-CM | POA: Diagnosis not present

## 2018-08-17 ENCOUNTER — Other Ambulatory Visit: Payer: Self-pay | Admitting: *Deleted

## 2018-08-17 NOTE — Patient Outreach (Signed)
[facility]) Care Management  08/17/2018  Debra Patel 31-Jan-1941 [id number]   Case closure   Call attempts made on 08/02/18, 08/04/18 and 08/09/18 Unsuccessful outreach letter sent on 08/02/18  without a response   Plan [facility] RN CM will close case after no response from patient within 10 business days. Unable to reach  Debra Patel. Lavina Hamman, RN, BSN, [place] Coordinator Office number [phone] Mobile number [phone]  Main [facility] number [phone] Fax number [phone]

## 2018-08-23 DIAGNOSIS — M25552 Pain in left hip: Secondary | ICD-10-CM | POA: Diagnosis not present

## 2018-08-23 DIAGNOSIS — M25562 Pain in left knee: Secondary | ICD-10-CM | POA: Diagnosis not present

## 2018-08-23 DIAGNOSIS — M6281 Muscle weakness (generalized): Secondary | ICD-10-CM | POA: Diagnosis not present

## 2018-08-26 DIAGNOSIS — M25562 Pain in left knee: Secondary | ICD-10-CM | POA: Diagnosis not present

## 2018-08-26 DIAGNOSIS — M25552 Pain in left hip: Secondary | ICD-10-CM | POA: Diagnosis not present

## 2018-08-26 DIAGNOSIS — M6281 Muscle weakness (generalized): Secondary | ICD-10-CM | POA: Diagnosis not present

## 2018-08-31 DIAGNOSIS — M25562 Pain in left knee: Secondary | ICD-10-CM | POA: Diagnosis not present

## 2018-08-31 DIAGNOSIS — M25552 Pain in left hip: Secondary | ICD-10-CM | POA: Diagnosis not present

## 2018-08-31 DIAGNOSIS — M6281 Muscle weakness (generalized): Secondary | ICD-10-CM | POA: Diagnosis not present

## 2018-09-05 DIAGNOSIS — S72009A Fracture of unspecified part of neck of unspecified femur, initial encounter for closed fracture: Secondary | ICD-10-CM | POA: Insufficient documentation

## 2018-10-04 DIAGNOSIS — H903 Sensorineural hearing loss, bilateral: Secondary | ICD-10-CM | POA: Diagnosis not present

## 2018-10-04 DIAGNOSIS — R42 Dizziness and giddiness: Secondary | ICD-10-CM | POA: Diagnosis not present

## 2018-10-06 ENCOUNTER — Encounter: Payer: Self-pay | Admitting: *Deleted

## 2018-10-06 DIAGNOSIS — R42 Dizziness and giddiness: Secondary | ICD-10-CM | POA: Diagnosis not present

## 2018-10-07 DIAGNOSIS — R42 Dizziness and giddiness: Secondary | ICD-10-CM | POA: Diagnosis not present

## 2018-10-09 DIAGNOSIS — R42 Dizziness and giddiness: Secondary | ICD-10-CM | POA: Insufficient documentation

## 2018-10-11 ENCOUNTER — Other Ambulatory Visit: Payer: Self-pay | Admitting: Otolaryngology

## 2018-10-11 DIAGNOSIS — R42 Dizziness and giddiness: Secondary | ICD-10-CM

## 2018-10-11 DIAGNOSIS — S72002D Fracture of unspecified part of neck of left femur, subsequent encounter for closed fracture with routine healing: Secondary | ICD-10-CM | POA: Diagnosis not present

## 2018-10-11 DIAGNOSIS — M1712 Unilateral primary osteoarthritis, left knee: Secondary | ICD-10-CM | POA: Diagnosis not present

## 2018-10-11 DIAGNOSIS — S82115D Nondisplaced fracture of left tibial spine, subsequent encounter for closed fracture with routine healing: Secondary | ICD-10-CM | POA: Diagnosis not present

## 2018-10-12 ENCOUNTER — Encounter: Payer: Self-pay | Admitting: *Deleted

## 2018-10-25 ENCOUNTER — Ambulatory Visit
Admission: RE | Admit: 2018-10-25 | Discharge: 2018-10-25 | Disposition: A | Discharge status: Home / Self Care | Payer: PPO | Source: Ambulatory Visit | Attending: Otolaryngology | Admitting: Otolaryngology

## 2018-10-25 ENCOUNTER — Ambulatory Visit: Admission: RE | Admit: 2018-10-25 | Payer: PPO | Source: Ambulatory Visit

## 2018-10-25 DIAGNOSIS — R42 Dizziness and giddiness: Secondary | ICD-10-CM | POA: Insufficient documentation

## 2018-10-25 DIAGNOSIS — S0990XA Unspecified injury of head, initial encounter: Secondary | ICD-10-CM | POA: Diagnosis not present

## 2018-10-25 LAB — POCT I-STAT CREATININE: Creatinine, Ser: 0.8 mg/dL (ref 0.44–1.00)

## 2018-10-25 MED ORDER — GADOBUTROL 1 MMOL/ML IV SOLN
6.0000 mL | Freq: Once | INTRAVENOUS | Status: AC | PRN
  Administered 2018-10-25: 6 mL via INTRAVENOUS

## 2018-10-28 DIAGNOSIS — H401131 Primary open-angle glaucoma, bilateral, mild stage: Secondary | ICD-10-CM | POA: Diagnosis not present

## 2018-11-08 DIAGNOSIS — M79605 Pain in left leg: Secondary | ICD-10-CM | POA: Diagnosis not present

## 2018-11-13 ENCOUNTER — Other Ambulatory Visit: Payer: Self-pay

## 2018-11-13 ENCOUNTER — Emergency Department
Admission: EM | Admit: 2018-11-13 | Discharge: 2018-11-13 | Disposition: A | Discharge status: Home / Self Care | Payer: PPO | Attending: Emergency Medicine | Admitting: Emergency Medicine

## 2018-11-13 ENCOUNTER — Emergency Department: Payer: PPO

## 2018-11-13 DIAGNOSIS — Z7901 Long term (current) use of anticoagulants: Secondary | ICD-10-CM | POA: Diagnosis not present

## 2018-11-13 DIAGNOSIS — M79662 Pain in left lower leg: Secondary | ICD-10-CM | POA: Diagnosis present

## 2018-11-13 DIAGNOSIS — Z91040 Latex allergy status: Secondary | ICD-10-CM | POA: Insufficient documentation

## 2018-11-13 DIAGNOSIS — M1712 Unilateral primary osteoarthritis, left knee: Secondary | ICD-10-CM | POA: Diagnosis not present

## 2018-11-13 DIAGNOSIS — Z853 Personal history of malignant neoplasm of breast: Secondary | ICD-10-CM | POA: Insufficient documentation

## 2018-11-13 DIAGNOSIS — M79605 Pain in left leg: Secondary | ICD-10-CM | POA: Diagnosis not present

## 2018-11-13 DIAGNOSIS — M79652 Pain in left thigh: Secondary | ICD-10-CM | POA: Diagnosis not present

## 2018-11-13 DIAGNOSIS — Z79899 Other long term (current) drug therapy: Secondary | ICD-10-CM | POA: Diagnosis not present

## 2018-11-13 MED ORDER — HYDROCODONE-ACETAMINOPHEN 5-325 MG PO TABS: 1.0000 | ORAL_TABLET | Freq: Four times a day (QID) | ORAL | 0 refills | Status: DC | PRN

## 2018-11-13 MED ORDER — ONDANSETRON 4 MG PO TBDP: 4.0000 mg | ORAL_TABLET | Freq: Three times a day (TID) | ORAL | 0 refills | Status: DC | PRN

## 2018-11-13 MED ORDER — MELOXICAM 7.5 MG PO TABS: 7.5000 mg | ORAL_TABLET | Freq: Every day | ORAL | 0 refills | Status: DC

## 2018-11-13 NOTE — ED Provider Notes (Signed)
[facility] EMERGENCY DEPARTMENT Provider Note   CSN: [id number] Arrival date & time: 11/13/18  1315    History   Chief Complaint Chief Complaint  Patient presents with  . Leg Pain    HPI Debra Patel is a 78 y.o. female.  Presents to the emergency department for evaluation of left leg pain.  She points to the knee.  She states she has pain and swelling.  She has a history of left hip ORIF with intramedullary fixation along with a proximal tibia fracture.  Patient states she has left knee arthritis and has had a recent cortisone injection with no improvement.  She continues to complain of pain and giving way sensation throughout the left knee.  She denies any new falls trauma or injury.  No back pain numbness tingling radicular symptoms.  She is able to ambulate with a walker.  She was prescribed tramadol but not getting much relief with tramadol.  She is unable to tolerate prednisone.     HPI  Past Medical History:  Diagnosis Date  . BP (high blood pressure) 08/21/2014  . Brain infection   . Breast cancer ([place]) 2001   Left side mastectomy  . Personal history of chemotherapy 2001   BREAST CA  . Personal history of radiation therapy 2001   BREAST CA  . Radiation 2001   BREAST CA  . S/P chemotherapy, time since greater than 12 weeks 2001   BREAST CA  . Tachycardia   . Thyroid disease   . Tick fever     Patient Active Problem List   Diagnosis Date Noted  . Syncope 06/29/2018  . Allergy to alpha-gal 12/16/2017  . 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 GERD (gastroesophageal reflux disease) 04/13/2017  . Constipation, chronic 03/05/2016  . 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 Fever 03/04/2015  . Febrile 03/04/2015  . Chest pain 02/28/2015  . Anxiety 10/23/2014  . Amnesia 10/23/2014  . Mild cognitive disorder 10/23/2014  . BP (high blood pressure) 08/21/2014  . 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 Adult hypothyroidism 11/24/2013    . Awareness of heartbeats 11/24/2013  . Mass of pelvis 11/15/2013  . Bad memory 07/27/2012  . Complete rupture of rotator cuff 10/26/2011  . Breast cancer, left ([place]) 04/21/2000    Past Surgical History:  Procedure Laterality Date  . ABDOMINAL HYSTERECTOMY    . BREAST EXCISIONAL BIOPSY Right 2002   EXCISIONAL - NEG  . HERNIA REPAIR    . INTRAMEDULLARY (IM) NAIL INTERTROCHANTERIC Left 06/29/2018   Procedure: INTRAMEDULLARY (IM) NAIL INTERTROCHANTRIC- TFNA;  Surgeon: Lovell Sheehan, MD;  Location: [facility] ORS;  Service: Orthopedics;  Laterality: Left;  Marland Kitchen Mass Removed from Bladder and Both Ovaries    . MASTECTOMY Left 2001   BREAST CA  . THYROIDECTOMY       OB History   No obstetric history on file.      Home Medications    Prior to Admission medications   Medication Sig Start Date End Date Taking? Authorizing Provider  acetaminophen (TYLENOL) 500 MG tablet Take 1,000 mg by mouth every 6 (six) hours as needed for mild pain.    [provider]  alendronate (FOSAMAX) 70 MG tablet Take 70 mg by mouth once a week. Take with a full glass of water on an empty stomach.    [provider]  apixaban (ELIQUIS) 5 MG TABS tablet Take 2 tablets (10 mg total) by mouth 2 (two) times daily. 07/01/18  Dustin Flock, MD  apixaban (ELIQUIS) 5 MG TABS tablet Take 1 tablet (5 mg total) by mouth 2 (two) times daily. 07/08/18   Dustin Flock, MD  Calcium Carbonate-Vitamin D 600-400 MG-UNIT tablet Take 1 tablet by mouth daily. 06/13/18   [provider]  docusate sodium (COLACE) 100 MG capsule Take 1 capsule (100 mg total) by mouth 2 (two) times daily. 07/01/18   Dustin Flock, MD  HYDROcodone-acetaminophen (NORCO) 5-325 MG tablet Take 1 tablet by mouth every 6 (six) hours as needed for moderate pain. 11/13/18   Duanne Guess, PA-C  levothyroxine (SYNTHROID, LEVOTHROID) 50 MCG tablet Take 50 mcg by mouth every morning. (brand name only)    [provider]   meloxicam (MOBIC) 7.5 MG tablet Take 1 tablet (7.5 mg total) by mouth daily. 11/13/18 11/13/19  Duanne Guess, PA-C  metoprolol tartrate (LOPRESSOR) 25 MG tablet Take 0.5 tablets (12.5 mg total) by mouth 2 (two) times daily. 07/01/18   Dustin Flock, MD  ondansetron (ZOFRAN ODT) 4 MG disintegrating tablet Take 1 tablet (4 mg total) by mouth every 8 (eight) hours as needed for nausea or vomiting. 11/13/18   Duanne Guess, PA-C  ondansetron (ZOFRAN) 4 MG tablet Take 1 tablet (4 mg total) by mouth every 6 (six) hours as needed for nausea. 07/01/18   Dustin Flock, MD  oxyCODONE-acetaminophen (PERCOCET) 7.5-325 MG tablet Take 1 tablet by mouth every 4 (four) hours as needed for severe pain. 07/01/18   Dustin Flock, MD  traMADol Veatrice Bourbon) 50 MG tablet 1-2 tabs po q 8 hours prn 07/01/18   Dustin Flock, MD    Family History Family History  Problem Relation Age of Onset  . Breast cancer Mother 30    Social History Social History   Tobacco Use  . Smoking status: Never Smoker  . Smokeless tobacco: Never Used  Substance Use Topics  . Alcohol use: No  . Drug use: No     Allergies   Pyridoxine; Hydrocodone; Alpha-gal; Bee venom; Motrin [ibuprofen]; Oxycodone; Latex; and Nickel   Review of Systems Review of Systems  Constitutional: Negative for fever.  Respiratory: Negative for shortness of breath.   Cardiovascular: Negative for chest pain.  Gastrointestinal: Negative for abdominal pain.  Genitourinary: Negative for difficulty urinating, dysuria and urgency.  Musculoskeletal: Positive for arthralgias, gait problem and joint swelling. Negative for back pain and myalgias.  Skin: Negative for rash.  Neurological: Negative for dizziness and headaches.     Physical Exam Updated Vital Signs BP (!) 141/86 (BP Location: Right Arm)   Pulse 93   Temp (!) 97.5 F (36.4 C) (Oral)   Resp 18   Ht 5\' 4"  (1.626 m)   Wt 63 kg   SpO2 99%   BMI 23.86 kg/m   Physical  Exam Constitutional:      Appearance: She is well-developed.  HENT:     Head: Normocephalic and atraumatic.  Eyes:     Conjunctiva/sclera: Conjunctivae normal.  Neck:     Musculoskeletal: Normal range of motion.  Cardiovascular:     Rate and Rhythm: Normal rate.  Pulmonary:     Effort: Pulmonary effort is normal. No respiratory distress.  Musculoskeletal: Normal range of motion.     Comments: Left knee with no swelling warmth erythema or effusion.  She is tender along the medial and lateral joint line.  She is able to straight leg raise.  She has pain and crepitus with knee range of motion.  Slight valgus deformity.  She  has no pain with hip internal or external rotation.  She has no swelling warmth erythema or edema throughout the left leg.  She is neurovascular tach to left lower extremity.  Skin:    General: Skin is warm.     Findings: No rash.  Neurological:     Mental Status: She is alert and oriented to person, place, and time.  Psychiatric:        Behavior: Behavior normal.        Thought Content: Thought content normal.      ED Treatments / Results  Labs (all labs ordered are listed, but only abnormal results are displayed) Labs Reviewed - No data to display  EKG None  Radiology Dg Tibia/fibula Left  Result Date: 11/13/2018 CLINICAL DATA:  Femur pain.  No new injury. EXAM: LEFT TIBIA AND FIBULA - 2 VIEW COMPARISON:  06/28/2018 FINDINGS: Remote healed proximal tibial metaphyseal fracture noted. No acute fracture. No fibular abnormality. Soft tissues are intact. IMPRESSION: Old healed proximal tibial fracture.  No acute bony abnormality. Electronically Signed   By: Rolm Baptise M.D.   On: 11/13/2018 15:35   Dg Femur Min 2 Views Left  Result Date: 11/13/2018 CLINICAL DATA:  Leg pain, remote trauma EXAM: LEFT FEMUR 2 VIEWS COMPARISON:  06/28/2018 FINDINGS: Hardware noted in the femur related to old healed intertrochanteric fracture. No acute fracture, subluxation or  dislocation. No hardware complicating feature. Moderate tricompartment degenerative changes in the left knee. IMPRESSION: No acute bony abnormality. Electronically Signed   By: Rolm Baptise M.D.   On: 11/13/2018 15:36    Procedures Procedures (including critical care time)  Medications Ordered in ED Medications - No data to display   Initial Impression / Assessment and Plan / ED Course  I have reviewed the triage vital signs and the nursing notes.  Pertinent labs & imaging results that were available during my care of the patient were reviewed by me and considered in my medical decision making (see chart for details).        78 year old female with tricompartmental left knee osteoarthritis that is moderate to severe.  She will try Mobic 7.5 mg daily for 7 to 10 days with food.  She will rest ice and elevate the knee.  She will discontinue tramadol and try Norco as needed for severe pain.  Final Clinical Impressions(s) / ED Diagnoses   Final diagnoses:  Primary osteoarthritis of left knee    ED Discharge Orders         Ordered    meloxicam (MOBIC) 7.5 MG tablet  Daily     11/13/18 1559    HYDROcodone-acetaminophen (NORCO) 5-325 MG tablet  Every 6 hours PRN     11/13/18 1559    ondansetron (ZOFRAN ODT) 4 MG disintegrating tablet  Every 8 hours PRN     11/13/18 1559           Renata Caprice 11/13/18 1603    Lavonia Drafts, MD 11/16/18 [id number]

## 2018-11-13 NOTE — Discharge Instructions (Addendum)
Please ambulate with a cane or walker.  Rest ice and elevate left knee.  Take Mobic 7.5 mg daily with food for 10 days.  Use Norco as needed for severe pain.  You may use Zofran if this causes any nausea.  Follow-up with your orthopedist to discuss further treatment options.

## 2018-11-13 NOTE — ED Triage Notes (Signed)
Pt states leg pain. Denies falling. States in fall had a fall where she broke L leg. Healed and then broke L hip. C/o of L pain x 2 weeks. No redness or warmth noted. No swelling noted. Has metal from hip to knee per pt.   A&O, in wheelchair. Speaking in complete sentences.

## 2018-11-22 DIAGNOSIS — M25562 Pain in left knee: Secondary | ICD-10-CM | POA: Diagnosis not present

## 2018-11-22 DIAGNOSIS — M1712 Unilateral primary osteoarthritis, left knee: Secondary | ICD-10-CM | POA: Diagnosis not present

## 2018-11-29 DIAGNOSIS — M1712 Unilateral primary osteoarthritis, left knee: Secondary | ICD-10-CM | POA: Diagnosis not present

## 2019-01-25 ENCOUNTER — Inpatient Hospital Stay: Admission: RE | Admit: 2019-01-25 | Payer: PPO | Source: Ambulatory Visit

## 2019-02-08 ENCOUNTER — Inpatient Hospital Stay: Admission: RE | Admit: 2019-02-08 | Payer: PPO | Source: Home / Self Care | Admitting: Orthopedic Surgery

## 2019-02-08 ENCOUNTER — Encounter: Admission: RE | Payer: Self-pay | Source: Home / Self Care

## 2019-02-08 SURGERY — ARTHROPLASTY, KNEE, TOTAL, USING IMAGELESS COMPUTER-ASSISTED NAVIGATION
Anesthesia: Choice | Laterality: Left

## 2019-02-22 DIAGNOSIS — R1030 Lower abdominal pain, unspecified: Secondary | ICD-10-CM | POA: Diagnosis not present

## 2019-02-22 DIAGNOSIS — N201 Calculus of ureter: Secondary | ICD-10-CM | POA: Diagnosis not present

## 2019-02-22 DIAGNOSIS — Z87442 Personal history of urinary calculi: Secondary | ICD-10-CM | POA: Diagnosis not present

## 2019-02-22 DIAGNOSIS — N202 Calculus of kidney with calculus of ureter: Secondary | ICD-10-CM | POA: Diagnosis not present

## 2019-02-22 DIAGNOSIS — N10 Acute tubulo-interstitial nephritis: Secondary | ICD-10-CM | POA: Diagnosis not present

## 2019-02-22 DIAGNOSIS — N132 Hydronephrosis with renal and ureteral calculous obstruction: Secondary | ICD-10-CM | POA: Diagnosis not present

## 2019-02-23 DIAGNOSIS — Z1159 Encounter for screening for other viral diseases: Secondary | ICD-10-CM | POA: Diagnosis not present

## 2019-02-23 DIAGNOSIS — E890 Postprocedural hypothyroidism: Secondary | ICD-10-CM | POA: Diagnosis not present

## 2019-02-23 DIAGNOSIS — R112 Nausea with vomiting, unspecified: Secondary | ICD-10-CM | POA: Diagnosis not present

## 2019-02-23 DIAGNOSIS — I10 Essential (primary) hypertension: Secondary | ICD-10-CM | POA: Diagnosis not present

## 2019-02-23 DIAGNOSIS — R509 Fever, unspecified: Secondary | ICD-10-CM | POA: Diagnosis not present

## 2019-02-23 DIAGNOSIS — Z923 Personal history of irradiation: Secondary | ICD-10-CM | POA: Diagnosis not present

## 2019-02-23 DIAGNOSIS — E039 Hypothyroidism, unspecified: Secondary | ICD-10-CM | POA: Diagnosis not present

## 2019-02-23 DIAGNOSIS — N200 Calculus of kidney: Secondary | ICD-10-CM | POA: Insufficient documentation

## 2019-02-23 DIAGNOSIS — Z86711 Personal history of pulmonary embolism: Secondary | ICD-10-CM | POA: Diagnosis not present

## 2019-02-23 DIAGNOSIS — D72829 Elevated white blood cell count, unspecified: Secondary | ICD-10-CM | POA: Diagnosis not present

## 2019-02-23 DIAGNOSIS — Z7982 Long term (current) use of aspirin: Secondary | ICD-10-CM | POA: Diagnosis not present

## 2019-02-23 DIAGNOSIS — Z9221 Personal history of antineoplastic chemotherapy: Secondary | ICD-10-CM | POA: Diagnosis not present

## 2019-02-23 DIAGNOSIS — N10 Acute tubulo-interstitial nephritis: Secondary | ICD-10-CM | POA: Diagnosis not present

## 2019-02-23 DIAGNOSIS — Z79899 Other long term (current) drug therapy: Secondary | ICD-10-CM | POA: Diagnosis not present

## 2019-02-23 DIAGNOSIS — N23 Unspecified renal colic: Secondary | ICD-10-CM | POA: Diagnosis not present

## 2019-02-23 DIAGNOSIS — Z9012 Acquired absence of left breast and nipple: Secondary | ICD-10-CM | POA: Diagnosis not present

## 2019-02-23 DIAGNOSIS — Z96649 Presence of unspecified artificial hip joint: Secondary | ICD-10-CM | POA: Diagnosis not present

## 2019-02-23 DIAGNOSIS — Z885 Allergy status to narcotic agent status: Secondary | ICD-10-CM | POA: Diagnosis not present

## 2019-02-23 DIAGNOSIS — N201 Calculus of ureter: Secondary | ICD-10-CM | POA: Diagnosis not present

## 2019-02-23 DIAGNOSIS — N132 Hydronephrosis with renal and ureteral calculous obstruction: Secondary | ICD-10-CM | POA: Diagnosis not present

## 2019-02-23 DIAGNOSIS — R350 Frequency of micturition: Secondary | ICD-10-CM | POA: Diagnosis not present

## 2019-02-23 DIAGNOSIS — R1032 Left lower quadrant pain: Secondary | ICD-10-CM | POA: Diagnosis not present

## 2019-02-23 DIAGNOSIS — Z853 Personal history of malignant neoplasm of breast: Secondary | ICD-10-CM | POA: Diagnosis not present

## 2019-02-23 DIAGNOSIS — Z90722 Acquired absence of ovaries, bilateral: Secondary | ICD-10-CM | POA: Diagnosis not present

## 2019-02-24 DIAGNOSIS — N200 Calculus of kidney: Secondary | ICD-10-CM | POA: Diagnosis not present

## 2019-02-24 DIAGNOSIS — N201 Calculus of ureter: Secondary | ICD-10-CM | POA: Diagnosis not present

## 2019-02-24 DIAGNOSIS — I10 Essential (primary) hypertension: Secondary | ICD-10-CM | POA: Diagnosis not present

## 2019-02-24 DIAGNOSIS — E039 Hypothyroidism, unspecified: Secondary | ICD-10-CM | POA: Diagnosis not present

## 2019-03-22 DIAGNOSIS — M25562 Pain in left knee: Secondary | ICD-10-CM | POA: Diagnosis not present

## 2019-03-23 NOTE — Discharge Instructions (Signed)
°  Instructions after Total Knee Replacement ° ° Trella Thurmond P. Quy Lotts, Jr., M.D.    ° Dept. of Orthopaedics & Sports Medicine ° [facility] ° [address] ° [place], [place]  [address] ° Phone: [phone]   Fax: [phone] ° °  °DIET: °• Drink plenty of non-alcoholic fluids. °• Resume your normal diet. Include foods high in fiber. ° °ACTIVITY:  °• You may use crutches or a walker with weight-bearing as tolerated, unless instructed otherwise. °• You may be weaned off of the walker or crutches by your Physical Therapist.  °• Do NOT place pillows under the knee. Anything placed under the knee could limit your ability to straighten the knee.   °• Continue doing gentle exercises. Exercising will reduce the pain and swelling, increase motion, and prevent muscle weakness.   °• Please continue to use the TED compression stockings for 6 weeks. You may remove the stockings at night, but should reapply them in the morning. °• Do not drive or operate any equipment until instructed. ° °WOUND CARE:  °• Continue to use the PolarCare or ice packs periodically to reduce pain and swelling. °• You may bathe or shower after the staples are removed at the first office visit following surgery. ° °MEDICATIONS: °• You may resume your regular medications. °• Please take the pain medication as prescribed on the medication. °• Do not take pain medication on an empty stomach. °• You have been given a prescription for a blood thinner (Lovenox or Coumadin). Please take the medication as instructed. (NOTE: After completing a 2 week course of Lovenox, take one Enteric-coated aspirin once a day. This along with elevation will help reduce the possibility of phlebitis in your operated leg.) °• Do not drive or drink alcoholic beverages when taking pain medications. ° °CALL THE OFFICE FOR: °• Temperature above 101 degrees °• Excessive bleeding or drainage on the dressing. °• Excessive swelling, coldness, or paleness of the toes. °• Persistent  nausea and vomiting. ° °FOLLOW-UP:  °• You should have an appointment to return to the office in 10-14 days after surgery. °• Arrangements have been made for continuation of Physical Therapy (either home therapy or outpatient therapy). °  °

## 2019-03-27 ENCOUNTER — Inpatient Hospital Stay: Admission: RE | Admit: 2019-03-27 | Payer: PPO | Source: Ambulatory Visit

## 2019-03-29 ENCOUNTER — Other Ambulatory Visit: Payer: Self-pay

## 2019-03-29 ENCOUNTER — Encounter
Admission: RE | Admit: 2019-03-29 | Discharge: 2019-03-29 | Disposition: A | Discharge status: Home / Self Care | Payer: PPO | Source: Ambulatory Visit | Attending: Orthopedic Surgery | Admitting: Orthopedic Surgery

## 2019-03-29 DIAGNOSIS — R001 Bradycardia, unspecified: Secondary | ICD-10-CM | POA: Diagnosis not present

## 2019-03-29 DIAGNOSIS — Z1159 Encounter for screening for other viral diseases: Secondary | ICD-10-CM | POA: Diagnosis not present

## 2019-03-29 DIAGNOSIS — Z01810 Encounter for preprocedural cardiovascular examination: Secondary | ICD-10-CM | POA: Diagnosis not present

## 2019-03-29 DIAGNOSIS — Z01818 Encounter for other preprocedural examination: Secondary | ICD-10-CM | POA: Diagnosis not present

## 2019-03-29 HISTORY — DX: Hypothyroidism, unspecified: E03.9

## 2019-03-29 HISTORY — DX: Personal history of urinary calculi: Z87.442

## 2019-03-29 HISTORY — DX: Unspecified osteoarthritis, unspecified site: M19.90

## 2019-03-29 LAB — PROTIME-INR
INR: 1.1 (ref 0.8–1.2)
Prothrombin Time: 14 seconds (ref 11.4–15.2)

## 2019-03-29 LAB — URINALYSIS, ROUTINE W REFLEX MICROSCOPIC
Bilirubin Urine: NEGATIVE
Glucose, UA: NEGATIVE mg/dL
Hgb urine dipstick: NEGATIVE
Ketones, ur: NEGATIVE mg/dL
Leukocytes,Ua: NEGATIVE
Nitrite: NEGATIVE
Protein, ur: NEGATIVE mg/dL
Specific Gravity, Urine: 1.016 (ref 1.005–1.030)
pH: 6 (ref 5.0–8.0)

## 2019-03-29 LAB — COMPREHENSIVE METABOLIC PANEL
ALT: 11 U/L (ref 0–44)
AST: 17 U/L (ref 15–41)
Albumin: 4.1 g/dL (ref 3.5–5.0)
Alkaline Phosphatase: 54 U/L (ref 38–126)
Anion gap: 9 (ref 5–15)
BUN: 15 mg/dL (ref 8–23)
CO2: 28 mmol/L (ref 22–32)
Calcium: 9.5 mg/dL (ref 8.9–10.3)
Chloride: 105 mmol/L (ref 98–111)
Creatinine, Ser: 0.66 mg/dL (ref 0.44–1.00)
GFR calc Af Amer: >60 mL/min (ref >60)
GFR calc non Af Amer: >60 mL/min (ref >60)
Glucose, Bld: 83 mg/dL (ref 70–99)
Potassium: 3.5 mmol/L (ref 3.5–5.1)
Sodium: 142 mmol/L (ref 135–145)
Total Bilirubin: 0.6 mg/dL (ref 0.3–1.2)
Total Protein: 6.7 g/dL (ref 6.5–8.1)

## 2019-03-29 LAB — TYPE AND SCREEN
ABO/RH(D): A NEG
Antibody Screen: NEGATIVE

## 2019-03-29 LAB — CBC
HCT: 41.2 % (ref 36.0–46.0)
Hemoglobin: 13.3 g/dL (ref 12.0–15.0)
MCH: 29.2 pg (ref 26.0–34.0)
MCHC: 32.3 g/dL (ref 30.0–36.0)
MCV: 90.5 fL (ref 80.0–100.0)
Platelets: 188 10*3/uL (ref 150–400)
RBC: 4.55 MIL/uL (ref 3.87–5.11)
RDW: 14 % (ref 11.5–15.5)
WBC: 5 10*3/uL (ref 4.0–10.5)
nRBC: 0 % (ref 0.0–0.2)

## 2019-03-29 LAB — C-REACTIVE PROTEIN: CRP: <0.8 mg/dL (ref <1.0)

## 2019-03-29 LAB — SURGICAL PCR SCREEN
MRSA, PCR: NEGATIVE
Staphylococcus aureus: NEGATIVE

## 2019-03-29 LAB — APTT: aPTT: 37 seconds — ABNORMAL HIGH (ref 24–36)

## 2019-03-29 LAB — SEDIMENTATION RATE: Sed Rate: 7 mm/hr (ref 0–30)

## 2019-03-29 NOTE — Patient Instructions (Addendum)
Your procedure is scheduled on: 04-03-19 MONDAY Report to Same Day Surgery 2nd floor medical mall [facility] - Closed Entrance-take elevator on left to 2nd floor.  Check in with surgery information desk.) To find out your arrival time please call [phone] between 1PM - 3PM on 03-31-19 FRIDAY  Remember: Instructions that are not followed completely may result in serious medical risk, up to and including death, or upon the discretion of your surgeon and anesthesiologist your surgery may need to be rescheduled.    _x___ 1. Do not eat food after midnight the night before your procedure. NO GUM OR CANDY AFTER MIDNIGHT. You may drink clear liquids up to 2 hours before you are scheduled to arrive at the hospital for your procedure.  Do not drink clear liquids within 2 hours of your scheduled arrival to the hospital.  Clear liquids include  --Water or Apple juice without pulp  --Clear carbohydrate beverage such as ClearFast or Gatorade  --Black Coffee or Clear Tea (No milk, no creamers, do not add anything to the coffee or Tea   ____Ensure clear carbohydrate drink on the way to the hospital for bariatric patients  _X___Ensure clear carbohydrate drink 3 hours before surgery    __x__ 2. No Alcohol for 24 hours before or after surgery.   __x__3. No Smoking or e-cigarettes for 24 prior to surgery.  Do not use any chewable tobacco products for at least 6 hour prior to surgery   ____  4. Bring all medications with you on the day of surgery if instructed.    __x__ 5. Notify your doctor if there is any change in your medical condition     (cold, fever, infections).    x___6. On the morning of surgery brush your teeth with toothpaste and water.  You may rinse your mouth with mouth wash if you wish.  Do not swallow any toothpaste or mouthwash.   Do not wear jewelry, make-up, hairpins, clips or nail polish.  Do not wear lotions, powders, or perfumes. You may wear deodorant.  Do not shave 48 hours prior  to surgery. Men may shave face and neck.  Do not bring valuables to the hospital.    [facility] is not responsible for any belongings or valuables.               Contacts, dentures or bridgework may not be worn into surgery.  Leave your suitcase in the car. After surgery it may be brought to your room.  For patients admitted to the hospital, discharge time is determined by your treatment team.  _  Patients discharged the day of surgery will not be allowed to drive home.  You will need someone to drive you home and stay with you the night of your procedure.    Please read over the following fact sheets that you were given:   [facility] Preparing for Surgery and or MRSA Information/INCENTIVE SPIROMETER   _x___ TAKE THE FOLLOWING MEDICATION THE MORNING OF SURGERY WITH A SMALL SIP OF WATER. These include:  1. SYNTHROID  2. BRING YOUR METOPROLOL BOTTLE WITH YOU TO [place] IN CASE ANESTHESIA WANTS YOU TO TAKE   3.  4.  5.  6.  ____Fleets enema or Magnesium Citrate as directed.   _x___ Use CHG Soap or sage wipes as directed on instruction sheet   ____ Use inhalers on the day of surgery and bring to hospital day of surgery  ____ Stop Metformin and Janumet 2 days prior to surgery.  ____ Take 1/2 of usual insulin dose the night before surgery and none on the morning surgery.   ____ Follow recommendations from Cardiologist, Pulmonologist or PCP regarding stopping Aspirin, Coumadin, Plavix ,Eliquis, Effient, or Pradaxa, and Pletal.  X____Stop Anti-inflammatories such as Advil, Aleve, Ibuprofen, Motrin, Naproxen, Naprosyn, Goodies powders or aspirin products NOW-OK to take Tylenol    _x___ Stop supplements until after surgery-STOP VITAMIN E NOW   ____ Bring C-Pap to the hospital.

## 2019-03-30 ENCOUNTER — Other Ambulatory Visit
Admission: RE | Admit: 2019-03-30 | Discharge: 2019-03-30 | Disposition: A | Discharge status: Home / Self Care | Payer: PPO | Source: Ambulatory Visit | Attending: Orthopedic Surgery | Admitting: Orthopedic Surgery

## 2019-03-30 DIAGNOSIS — Z01818 Encounter for other preprocedural examination: Secondary | ICD-10-CM | POA: Diagnosis not present

## 2019-03-30 LAB — URINE CULTURE: Culture: NO GROWTH

## 2019-03-31 ENCOUNTER — Encounter: Payer: Self-pay | Admitting: Orthopedic Surgery

## 2019-03-31 LAB — LATEX, IGE: Latex: <0.1 kU/L

## 2019-03-31 LAB — SARS CORONAVIRUS 2 (TAT 6-24 HRS): SARS Coronavirus 2: NEGATIVE

## 2019-04-02 MED ORDER — TRANEXAMIC ACID-NACL 1000-0.7 MG/100ML-% IV SOLN
1000.0000 mg | INTRAVENOUS | Status: AC
  Administered 2019-04-03: 15:00:00 1000 mg via INTRAVENOUS
  Filled 2019-04-02: qty 100

## 2019-04-02 MED ORDER — CEFAZOLIN SODIUM-DEXTROSE 2-4 GM/100ML-% IV SOLN
2.0000 g | INTRAVENOUS | Status: AC
  Administered 2019-04-03: 2 g via INTRAVENOUS

## 2019-04-03 ENCOUNTER — Inpatient Hospital Stay: Payer: PPO

## 2019-04-03 ENCOUNTER — Inpatient Hospital Stay
Admission: RE | Admit: 2019-04-03 | Discharge: 2019-04-05 | DRG: 470 | Disposition: A | Discharge status: Home Health | Payer: PPO | Attending: Orthopedic Surgery | Admitting: Orthopedic Surgery

## 2019-04-03 ENCOUNTER — Encounter
Admission: RE | Disposition: A | Discharge status: Home Health | Payer: Self-pay | Source: Home / Self Care | Attending: Orthopedic Surgery

## 2019-04-03 ENCOUNTER — Encounter: Payer: Self-pay | Admitting: Orthopedic Surgery

## 2019-04-03 ENCOUNTER — Other Ambulatory Visit: Payer: Self-pay

## 2019-04-03 ENCOUNTER — Inpatient Hospital Stay: Payer: PPO | Admitting: Anesthesiology

## 2019-04-03 DIAGNOSIS — M1712 Unilateral primary osteoarthritis, left knee: Principal | ICD-10-CM | POA: Diagnosis present

## 2019-04-03 DIAGNOSIS — M479 Spondylosis, unspecified: Secondary | ICD-10-CM | POA: Diagnosis not present

## 2019-04-03 DIAGNOSIS — Z885 Allergy status to narcotic agent status: Secondary | ICD-10-CM | POA: Diagnosis not present

## 2019-04-03 DIAGNOSIS — Z79899 Other long term (current) drug therapy: Secondary | ICD-10-CM

## 2019-04-03 DIAGNOSIS — M25762 Osteophyte, left knee: Secondary | ICD-10-CM | POA: Diagnosis present

## 2019-04-03 DIAGNOSIS — Z86711 Personal history of pulmonary embolism: Secondary | ICD-10-CM

## 2019-04-03 DIAGNOSIS — E039 Hypothyroidism, unspecified: Secondary | ICD-10-CM | POA: Diagnosis present

## 2019-04-03 DIAGNOSIS — K5090 Crohn's disease, unspecified, without complications: Secondary | ICD-10-CM | POA: Diagnosis not present

## 2019-04-03 DIAGNOSIS — K219 Gastro-esophageal reflux disease without esophagitis: Secondary | ICD-10-CM | POA: Diagnosis not present

## 2019-04-03 DIAGNOSIS — M19042 Primary osteoarthritis, left hand: Secondary | ICD-10-CM | POA: Diagnosis present

## 2019-04-03 DIAGNOSIS — Z1159 Encounter for screening for other viral diseases: Secondary | ICD-10-CM | POA: Diagnosis not present

## 2019-04-03 DIAGNOSIS — M19041 Primary osteoarthritis, right hand: Secondary | ICD-10-CM | POA: Diagnosis present

## 2019-04-03 DIAGNOSIS — Z91040 Latex allergy status: Secondary | ICD-10-CM | POA: Diagnosis not present

## 2019-04-03 DIAGNOSIS — I10 Essential (primary) hypertension: Secondary | ICD-10-CM | POA: Diagnosis not present

## 2019-04-03 DIAGNOSIS — Z888 Allergy status to other drugs, medicaments and biological substances status: Secondary | ICD-10-CM

## 2019-04-03 DIAGNOSIS — Z881 Allergy status to other antibiotic agents status: Secondary | ICD-10-CM

## 2019-04-03 DIAGNOSIS — Z79890 Hormone replacement therapy (postmenopausal): Secondary | ICD-10-CM | POA: Diagnosis not present

## 2019-04-03 DIAGNOSIS — Z471 Aftercare following joint replacement surgery: Secondary | ICD-10-CM | POA: Diagnosis not present

## 2019-04-03 DIAGNOSIS — Z96652 Presence of left artificial knee joint: Secondary | ICD-10-CM | POA: Diagnosis not present

## 2019-04-03 DIAGNOSIS — K5909 Other constipation: Secondary | ICD-10-CM | POA: Diagnosis present

## 2019-04-03 DIAGNOSIS — Z96659 Presence of unspecified artificial knee joint: Secondary | ICD-10-CM

## 2019-04-03 HISTORY — PX: KNEE ARTHROPLASTY: SHX992

## 2019-04-03 LAB — ABO/RH: ABO/RH(D): A NEG

## 2019-04-03 SURGERY — ARTHROPLASTY, KNEE, TOTAL, USING IMAGELESS COMPUTER-ASSISTED NAVIGATION
Anesthesia: Spinal | Site: Knee | Laterality: Left

## 2019-04-03 MED ORDER — METOCLOPRAMIDE HCL 5 MG/ML IJ SOLN: 5.0000 mg | Freq: Three times a day (TID) | INTRAMUSCULAR | Status: DC | PRN

## 2019-04-03 MED ORDER — METOPROLOL SUCCINATE ER 25 MG PO TB24: 25.0000 mg | ORAL_TABLET | Freq: Every day | ORAL | Status: DC | PRN

## 2019-04-03 MED ORDER — HYDROCORTISONE 1 % EX CREA
1.0000 "application " | TOPICAL_CREAM | CUTANEOUS | Status: DC | PRN
  Filled 2019-04-03: qty 28

## 2019-04-03 MED ORDER — MAGNESIUM HYDROXIDE 400 MG/5ML PO SUSP
30.0000 mL | Freq: Every day | ORAL | Status: DC
  Administered 2019-04-03 – 2019-04-05 (×3): 30 mL via ORAL
  Filled 2019-04-03 (×3): qty 30

## 2019-04-03 MED ORDER — VITAMIN D3 25 MCG (1000 UNIT) PO TABS
1000.0000 [IU] | ORAL_TABLET | Freq: Every day | ORAL | Status: DC
  Administered 2019-04-03 – 2019-04-05 (×3): 1000 [IU] via ORAL
  Filled 2019-04-03 (×6): qty 1

## 2019-04-03 MED ORDER — CELECOXIB 200 MG PO CAPS
ORAL_CAPSULE | ORAL | Status: AC
  Administered 2019-04-03: 13:00:00 400 mg via ORAL
  Filled 2019-04-03: qty 2

## 2019-04-03 MED ORDER — METOCLOPRAMIDE HCL 10 MG PO TABS
10.0000 mg | ORAL_TABLET | Freq: Three times a day (TID) | ORAL | Status: DC
  Administered 2019-04-03 – 2019-04-05 (×6): 10 mg via ORAL
  Filled 2019-04-03 (×6): qty 1

## 2019-04-03 MED ORDER — ACETAMINOPHEN 10 MG/ML IV SOLN
1000.0000 mg | Freq: Four times a day (QID) | INTRAVENOUS | Status: AC
  Administered 2019-04-03 – 2019-04-04 (×4): 1000 mg via INTRAVENOUS
  Filled 2019-04-03 (×4): qty 100

## 2019-04-03 MED ORDER — HYDROMORPHONE HCL 1 MG/ML IJ SOLN: 0.5000 mg | INTRAMUSCULAR | Status: DC | PRN

## 2019-04-03 MED ORDER — DIPHENHYDRAMINE HCL 12.5 MG/5ML PO ELIX: 12.5000 mg | ORAL_SOLUTION | ORAL | Status: DC | PRN

## 2019-04-03 MED ORDER — FENTANYL CITRATE (PF) 100 MCG/2ML IJ SOLN
INTRAMUSCULAR | Status: AC
  Filled 2019-04-03: qty 2

## 2019-04-03 MED ORDER — BISACODYL 10 MG RE SUPP
10.0000 mg | Freq: Every day | RECTAL | Status: DC | PRN
  Administered 2019-04-05 (×2): 10 mg via RECTAL
  Filled 2019-04-03 (×2): qty 1

## 2019-04-03 MED ORDER — GABAPENTIN 300 MG PO CAPS
ORAL_CAPSULE | ORAL | Status: AC
  Administered 2019-04-03: 13:00:00 300 mg via ORAL
  Filled 2019-04-03: qty 1

## 2019-04-03 MED ORDER — LEVOTHYROXINE SODIUM 50 MCG PO TABS
50.0000 ug | ORAL_TABLET | Freq: Every day | ORAL | Status: DC
  Administered 2019-04-04 – 2019-04-05 (×2): 50 ug via ORAL
  Filled 2019-04-03 (×2): qty 1

## 2019-04-03 MED ORDER — DEXAMETHASONE SODIUM PHOSPHATE 10 MG/ML IJ SOLN
INTRAMUSCULAR | Status: AC
  Administered 2019-04-03: 13:00:00 8 mg via INTRAVENOUS
  Filled 2019-04-03: qty 1

## 2019-04-03 MED ORDER — TETRACAINE HCL 1 % IJ SOLN
INTRAMUSCULAR | Status: DC | PRN
  Administered 2019-04-03: 5 mg via INTRASPINAL

## 2019-04-03 MED ORDER — ACETAMINOPHEN 325 MG PO TABS: 325.0000 mg | ORAL_TABLET | Freq: Four times a day (QID) | ORAL | Status: DC | PRN

## 2019-04-03 MED ORDER — POTASSIUM 99 MG PO TABS: 99.0000 mg | ORAL_TABLET | Freq: Every day | ORAL | Status: DC

## 2019-04-03 MED ORDER — ENOXAPARIN SODIUM 30 MG/0.3ML ~~LOC~~ SOLN
30.0000 mg | Freq: Two times a day (BID) | SUBCUTANEOUS | Status: DC
  Administered 2019-04-04 – 2019-04-05 (×3): 30 mg via SUBCUTANEOUS
  Filled 2019-04-03 (×3): qty 0.3

## 2019-04-03 MED ORDER — SODIUM CHLORIDE 0.9 % IV SOLN
INTRAVENOUS | Status: DC | PRN
  Administered 2019-04-03: 60 mL

## 2019-04-03 MED ORDER — BUPIVACAINE HCL (PF) 0.25 % IJ SOLN
INTRAMUSCULAR | Status: AC
  Filled 2019-04-03: qty 60

## 2019-04-03 MED ORDER — SODIUM CHLORIDE 0.9 % IV SOLN
INTRAVENOUS | Status: DC
  Administered 2019-04-03: 19:00:00 via INTRAVENOUS

## 2019-04-03 MED ORDER — NEOMYCIN-POLYMYXIN B GU 40-200000 IR SOLN
Status: DC | PRN
  Administered 2019-04-03: 14 mL

## 2019-04-03 MED ORDER — FAMOTIDINE 20 MG PO TABS
20.0000 mg | ORAL_TABLET | Freq: Once | ORAL | Status: AC
  Administered 2019-04-03: 13:00:00 20 mg via ORAL

## 2019-04-03 MED ORDER — TRANEXAMIC ACID-NACL 1000-0.7 MG/100ML-% IV SOLN
1000.0000 mg | Freq: Once | INTRAVENOUS | Status: AC
  Administered 2019-04-03: 18:00:00 1000 mg via INTRAVENOUS
  Filled 2019-04-03: qty 100

## 2019-04-03 MED ORDER — DEXAMETHASONE SODIUM PHOSPHATE 10 MG/ML IJ SOLN
8.0000 mg | Freq: Once | INTRAMUSCULAR | Status: AC
  Administered 2019-04-03: 13:00:00 8 mg via INTRAVENOUS

## 2019-04-03 MED ORDER — VITAMIN E 45 MG (100 UNIT) PO CAPS
200.0000 [IU] | ORAL_CAPSULE | Freq: Every day | ORAL | Status: DC
  Administered 2019-04-04: 200 [IU] via ORAL
  Filled 2019-04-03 (×2): qty 2

## 2019-04-03 MED ORDER — ACETAMINOPHEN 10 MG/ML IV SOLN
INTRAVENOUS | Status: AC
  Filled 2019-04-03: qty 100

## 2019-04-03 MED ORDER — ONDANSETRON HCL 4 MG/2ML IJ SOLN
4.0000 mg | Freq: Four times a day (QID) | INTRAMUSCULAR | Status: DC | PRN
  Administered 2019-04-03: 4 mg via INTRAVENOUS
  Filled 2019-04-03: qty 2

## 2019-04-03 MED ORDER — VITAMIN B-12 1000 MCG PO TABS
5000.0000 ug | ORAL_TABLET | Freq: Every day | ORAL | Status: DC
  Administered 2019-04-03 – 2019-04-05 (×3): 5000 ug via ORAL
  Filled 2019-04-03 (×4): qty 5

## 2019-04-03 MED ORDER — CEFAZOLIN SODIUM-DEXTROSE 2-4 GM/100ML-% IV SOLN
2.0000 g | Freq: Four times a day (QID) | INTRAVENOUS | Status: AC
  Administered 2019-04-03 – 2019-04-04 (×4): 2 g via INTRAVENOUS
  Filled 2019-04-03 (×4): qty 100

## 2019-04-03 MED ORDER — VITAMIN C 500 MG PO TABS
500.0000 mg | ORAL_TABLET | Freq: Every day | ORAL | Status: DC
  Administered 2019-04-03 – 2019-04-05 (×3): 500 mg via ORAL
  Filled 2019-04-03 (×3): qty 1

## 2019-04-03 MED ORDER — FERROUS SULFATE 325 (65 FE) MG PO TABS
325.0000 mg | ORAL_TABLET | Freq: Two times a day (BID) | ORAL | Status: DC
  Administered 2019-04-04 – 2019-04-05 (×3): 325 mg via ORAL
  Filled 2019-04-03 (×3): qty 1

## 2019-04-03 MED ORDER — PROPOFOL 10 MG/ML IV BOLUS
INTRAVENOUS | Status: DC | PRN
  Administered 2019-04-03 (×2): 10 mg via INTRAVENOUS

## 2019-04-03 MED ORDER — CELECOXIB 200 MG PO CAPS
200.0000 mg | ORAL_CAPSULE | Freq: Two times a day (BID) | ORAL | Status: DC
  Administered 2019-04-03 – 2019-04-05 (×4): 200 mg via ORAL
  Filled 2019-04-03 (×4): qty 1

## 2019-04-03 MED ORDER — APOAEQUORIN 20 MG PO CAPS: 20.0000 mg | ORAL_CAPSULE | Freq: Every day | ORAL | Status: DC

## 2019-04-03 MED ORDER — NEOMYCIN-POLYMYXIN B GU 40-200000 IR SOLN
Status: AC
  Filled 2019-04-03: qty 20

## 2019-04-03 MED ORDER — CEFAZOLIN SODIUM-DEXTROSE 2-4 GM/100ML-% IV SOLN
INTRAVENOUS | Status: AC
  Filled 2019-04-03: qty 100

## 2019-04-03 MED ORDER — PROPOFOL 500 MG/50ML IV EMUL
INTRAVENOUS | Status: AC
  Filled 2019-04-03: qty 50

## 2019-04-03 MED ORDER — SENNOSIDES-DOCUSATE SODIUM 8.6-50 MG PO TABS
1.0000 | ORAL_TABLET | Freq: Two times a day (BID) | ORAL | Status: DC
  Administered 2019-04-03 – 2019-04-05 (×4): 1 via ORAL
  Filled 2019-04-03 (×4): qty 1

## 2019-04-03 MED ORDER — MIDAZOLAM HCL 2 MG/2ML IJ SOLN
INTRAMUSCULAR | Status: AC
  Filled 2019-04-03: qty 2

## 2019-04-03 MED ORDER — OXYCODONE HCL 5 MG PO TABS
5.0000 mg | ORAL_TABLET | ORAL | Status: DC | PRN
  Administered 2019-04-03 – 2019-04-05 (×2): 5 mg via ORAL
  Filled 2019-04-03 (×2): qty 1

## 2019-04-03 MED ORDER — GABAPENTIN 300 MG PO CAPS
300.0000 mg | ORAL_CAPSULE | Freq: Every day | ORAL | Status: DC
  Administered 2019-04-03 – 2019-04-04 (×2): 300 mg via ORAL
  Filled 2019-04-03 (×2): qty 1

## 2019-04-03 MED ORDER — BUPIVACAINE HCL (PF) 0.25 % IJ SOLN
INTRAMUSCULAR | Status: DC | PRN
  Administered 2019-04-03: 60 mL

## 2019-04-03 MED ORDER — ONDANSETRON HCL 4 MG PO TABS: 4.0000 mg | ORAL_TABLET | Freq: Four times a day (QID) | ORAL | Status: DC | PRN

## 2019-04-03 MED ORDER — OXYMETAZOLINE HCL 0.05 % NA SOLN
2.0000 | NASAL | Status: DC | PRN
  Filled 2019-04-03: qty 15

## 2019-04-03 MED ORDER — MENTHOL 3 MG MT LOZG
1.0000 | LOZENGE | OROMUCOSAL | Status: DC | PRN
  Filled 2019-04-03: qty 9

## 2019-04-03 MED ORDER — FAMOTIDINE 20 MG PO TABS
ORAL_TABLET | ORAL | Status: AC
  Administered 2019-04-03: 13:00:00 20 mg via ORAL
  Filled 2019-04-03: qty 1

## 2019-04-03 MED ORDER — GABAPENTIN 300 MG PO CAPS
300.0000 mg | ORAL_CAPSULE | Freq: Once | ORAL | Status: AC
  Administered 2019-04-03: 13:00:00 300 mg via ORAL

## 2019-04-03 MED ORDER — GLYCOPYRROLATE 0.2 MG/ML IJ SOLN
INTRAMUSCULAR | Status: DC | PRN
  Administered 2019-04-03: 0.2 mg via INTRAVENOUS

## 2019-04-03 MED ORDER — PANTOPRAZOLE SODIUM 40 MG PO TBEC
40.0000 mg | DELAYED_RELEASE_TABLET | Freq: Two times a day (BID) | ORAL | Status: DC
  Administered 2019-04-03 – 2019-04-05 (×4): 40 mg via ORAL
  Filled 2019-04-03 (×4): qty 1

## 2019-04-03 MED ORDER — FENTANYL CITRATE (PF) 100 MCG/2ML IJ SOLN
25.0000 ug | INTRAMUSCULAR | Status: DC | PRN
  Administered 2019-04-03 (×4): 25 ug via INTRAVENOUS

## 2019-04-03 MED ORDER — FLEET ENEMA 7-19 GM/118ML RE ENEM: 1.0000 | ENEMA | Freq: Once | RECTAL | Status: DC | PRN

## 2019-04-03 MED ORDER — PROPOFOL 500 MG/50ML IV EMUL
INTRAVENOUS | Status: DC | PRN
  Administered 2019-04-03: 40 ug/kg/min via INTRAVENOUS

## 2019-04-03 MED ORDER — ONDANSETRON HCL 4 MG/2ML IJ SOLN: 4.0000 mg | Freq: Once | INTRAMUSCULAR | Status: DC | PRN

## 2019-04-03 MED ORDER — PHENOL 1.4 % MT LIQD
1.0000 | OROMUCOSAL | Status: DC | PRN
  Filled 2019-04-03: qty 177

## 2019-04-03 MED ORDER — TRAMADOL HCL 50 MG PO TABS
50.0000 mg | ORAL_TABLET | ORAL | Status: DC | PRN
  Administered 2019-04-03: 23:00:00 50 mg via ORAL
  Filled 2019-04-03: qty 1

## 2019-04-03 MED ORDER — BUPIVACAINE HCL (PF) 0.5 % IJ SOLN
INTRAMUSCULAR | Status: DC | PRN
  Administered 2019-04-03: 2.5 mL

## 2019-04-03 MED ORDER — CELECOXIB 200 MG PO CAPS
400.0000 mg | ORAL_CAPSULE | Freq: Once | ORAL | Status: AC
  Administered 2019-04-03: 13:00:00 400 mg via ORAL

## 2019-04-03 MED ORDER — ACETAMINOPHEN 10 MG/ML IV SOLN
INTRAVENOUS | Status: DC | PRN
  Administered 2019-04-03: 1000 mg via INTRAVENOUS

## 2019-04-03 MED ORDER — NAPHAZOLINE-GLYCERIN 0.012-0.2 % OP SOLN
2.0000 [drp] | OPHTHALMIC | Status: DC | PRN
  Filled 2019-04-03: qty 15

## 2019-04-03 MED ORDER — ALUM & MAG HYDROXIDE-SIMETH 200-200-20 MG/5ML PO SUSP: 30.0000 mL | ORAL | Status: DC | PRN

## 2019-04-03 MED ORDER — BUPIVACAINE LIPOSOME 1.3 % IJ SUSP
INTRAMUSCULAR | Status: AC
  Filled 2019-04-03: qty 20

## 2019-04-03 MED ORDER — MIDAZOLAM HCL 5 MG/5ML IJ SOLN
INTRAMUSCULAR | Status: DC | PRN
  Administered 2019-04-03: 1 mg via INTRAVENOUS

## 2019-04-03 MED ORDER — FENTANYL CITRATE (PF) 100 MCG/2ML IJ SOLN
INTRAMUSCULAR | Status: DC | PRN
  Administered 2019-04-03: 50 ug via INTRAVENOUS

## 2019-04-03 MED ORDER — SODIUM CHLORIDE FLUSH 0.9 % IV SOLN
INTRAVENOUS | Status: AC
  Filled 2019-04-03: qty 40

## 2019-04-03 MED ORDER — RISAQUAD PO CAPS
1.0000 | ORAL_CAPSULE | Freq: Every day | ORAL | Status: DC
  Administered 2019-04-04 – 2019-04-05 (×2): 1 via ORAL
  Filled 2019-04-03 (×4): qty 1

## 2019-04-03 MED ORDER — CHLORHEXIDINE GLUCONATE 4 % EX LIQD: 60.0000 mL | Freq: Once | CUTANEOUS | Status: DC

## 2019-04-03 MED ORDER — SODIUM CHLORIDE 0.9 % IV SOLN
INTRAVENOUS | Status: DC | PRN
  Administered 2019-04-03: 15:00:00 30 ug/min via INTRAVENOUS

## 2019-04-03 MED ORDER — OXYCODONE HCL 5 MG PO TABS
10.0000 mg | ORAL_TABLET | ORAL | Status: DC | PRN
  Administered 2019-04-04 (×2): 10 mg via ORAL
  Filled 2019-04-03 (×2): qty 2

## 2019-04-03 MED ORDER — ENSURE PRE-SURGERY PO LIQD
296.0000 mL | Freq: Once | ORAL | Status: DC
  Filled 2019-04-03: qty 296

## 2019-04-03 MED ORDER — METOCLOPRAMIDE HCL 10 MG PO TABS
5.0000 mg | ORAL_TABLET | Freq: Three times a day (TID) | ORAL | Status: DC | PRN
  Filled 2019-04-03: qty 1

## 2019-04-03 MED ORDER — LACTATED RINGERS IV SOLN
INTRAVENOUS | Status: DC
  Administered 2019-04-03 (×2): via INTRAVENOUS

## 2019-04-03 SURGICAL SUPPLY — 68 items
ATTUNE MED DOME PAT 38 KNEE (Knees) ×2 IMPLANT
ATTUNE MED DOME PAT 38MM KNEE (Knees) ×1 IMPLANT
ATTUNE PSFEM LTSZ6 NARCEM KNEE (Femur) ×3 IMPLANT
ATTUNE PSRP INSR SZ6 10 KNEE (Insert) ×2 IMPLANT
ATTUNE PSRP INSR SZ6 10MM KNEE (Insert) ×1 IMPLANT
BASE TIBIA ATTUNE KNEE SYS SZ6 (Knees) ×1 IMPLANT
BATTERY INSTRU NAVIGATION (MISCELLANEOUS) ×12 IMPLANT
BLADE SAW 70X12.5 (BLADE) ×3 IMPLANT
BLADE SAW 90X13X1.19 OSCILLAT (BLADE) ×3 IMPLANT
BLADE SAW 90X25X1.19 OSCILLAT (BLADE) ×3 IMPLANT
CANISTER SUCT 3000ML PPV (MISCELLANEOUS) ×3 IMPLANT
CEMENT HV SMART SET (Cement) ×6 IMPLANT
COOLER POLAR GLACIER W/PUMP (MISCELLANEOUS) ×3 IMPLANT
COVER WAND RF STERILE (DRAPES) ×3 IMPLANT
CUFF TOURN SGL QUICK 24 (TOURNIQUET CUFF)
CUFF TOURN SGL QUICK 30 (TOURNIQUET CUFF)
CUFF TRNQT CYL 24X4X16.5-23 (TOURNIQUET CUFF) IMPLANT
CUFF TRNQT CYL 30X4X21-28X (TOURNIQUET CUFF) IMPLANT
DRAPE SHEET LG 3/4 BI-LAMINATE (DRAPES) ×3 IMPLANT
DRSG DERMACEA 8X12 NADH (GAUZE/BANDAGES/DRESSINGS) ×3 IMPLANT
DRSG OPSITE POSTOP 4X14 (GAUZE/BANDAGES/DRESSINGS) ×3 IMPLANT
DRSG TEGADERM 4X4.75 (GAUZE/BANDAGES/DRESSINGS) ×3 IMPLANT
DURAPREP 26ML APPLICATOR (WOUND CARE) ×6 IMPLANT
ELECT REM PT RETURN 9FT ADLT (ELECTROSURGICAL) ×3
ELECTRODE REM PT RTRN 9FT ADLT (ELECTROSURGICAL) ×1 IMPLANT
EX-PIN ORTHOLOCK NAV 4X150 (PIN) ×6 IMPLANT
GLOVE BIOGEL M STRL SZ7.5 (GLOVE) ×6 IMPLANT
GLOVE INDICATOR 8.0 STRL GRN (GLOVE) ×3 IMPLANT
GOWN STRL REUS W/ TWL LRG LVL3 (GOWN DISPOSABLE) ×2 IMPLANT
GOWN STRL REUS W/TWL LRG LVL3 (GOWN DISPOSABLE) ×4
HEMOVAC 400CC 10FR (MISCELLANEOUS) ×3 IMPLANT
HOLDER FOLEY CATH W/STRAP (MISCELLANEOUS) ×3 IMPLANT
HOOD PEEL AWAY FLYTE STAYCOOL (MISCELLANEOUS) ×6 IMPLANT
KIT TURNOVER KIT A (KITS) ×3 IMPLANT
KNIFE SCULPS 14X20 (INSTRUMENTS) ×3 IMPLANT
LABEL OR SOLS (LABEL) ×3 IMPLANT
MANIFOLD NEPTUNE WASTE (CANNULA) ×3 IMPLANT
NDL SAFETY ECLIPSE 18X1.5 (NEEDLE) ×1 IMPLANT
NEEDLE HYPO 18GX1.5 SHARP (NEEDLE) ×2
NEEDLE SPNL 20GX3.5 QUINCKE YW (NEEDLE) ×6 IMPLANT
NS IRRIG 500ML POUR BTL (IV SOLUTION) ×3 IMPLANT
PACK TOTAL KNEE (MISCELLANEOUS) ×3 IMPLANT
PAD WRAPON POLAR KNEE (MISCELLANEOUS) ×1 IMPLANT
PENCIL SMOKE ULTRAEVAC 22 CON (MISCELLANEOUS) ×3 IMPLANT
PIN DRILL QUICK PACK ×3 IMPLANT
PIN FIXATION 1/8DIA X 3INL (PIN) ×9 IMPLANT
PULSAVAC PLUS IRRIG FAN TIP (DISPOSABLE) ×3
SOL .9 NS 3000ML IRR  AL (IV SOLUTION) ×2
SOL .9 NS 3000ML IRR UROMATIC (IV SOLUTION) ×1 IMPLANT
SOL PREP PVP 2OZ (MISCELLANEOUS) ×3
SOLUTION PREP PVP 2OZ (MISCELLANEOUS) ×1 IMPLANT
SPONGE DRAIN TRACH 4X4 STRL 2S (GAUZE/BANDAGES/DRESSINGS) ×3 IMPLANT
STAPLER SKIN PROX 35W (STAPLE) ×3 IMPLANT
STOCKINETTE IMPERV 14X48 (MISCELLANEOUS) IMPLANT
STRAP TIBIA SHORT (MISCELLANEOUS) ×3 IMPLANT
SUCTION FRAZIER HANDLE 10FR (MISCELLANEOUS) ×2
SUCTION TUBE FRAZIER 10FR DISP (MISCELLANEOUS) ×1 IMPLANT
SUT VIC AB 0 CT1 36 (SUTURE) ×3 IMPLANT
SUT VIC AB 1 CT1 36 (SUTURE) ×6 IMPLANT
SUT VIC AB 2-0 CT2 27 (SUTURE) ×3 IMPLANT
SYR 20CC LL (SYRINGE) ×3 IMPLANT
SYR 30ML LL (SYRINGE) ×6 IMPLANT
TIBIA ATTUNE KNEE SYS BASE SZ6 (Knees) ×3 IMPLANT
TIP FAN IRRIG PULSAVAC PLUS (DISPOSABLE) ×1 IMPLANT
TOWEL OR 17X26 4PK STRL BLUE (TOWEL DISPOSABLE) ×3 IMPLANT
TOWER CARTRIDGE SMART MIX (DISPOSABLE) ×3 IMPLANT
TRAY FOLEY MTR SLVR 16FR STAT (SET/KITS/TRAYS/PACK) ×3 IMPLANT
WRAPON POLAR PAD KNEE (MISCELLANEOUS) ×3

## 2019-04-03 NOTE — Transfer of Care (Signed)
Immediate Anesthesia Transfer of Care Note  Patient: Debra Patel  Procedure(s) Performed: COMPUTER ASSISTED TOTAL KNEE ARTHROPLASTY (Left Knee)  Patient Location: PACU  Anesthesia Type:Spinal  Level of Consciousness: drowsy  Airway & Oxygen Therapy: Patient Spontanous Breathing and Patient connected to nasal cannula oxygen  Post-op Assessment: Report given to RN and Post -op Vital signs reviewed and stable  Post vital signs: Reviewed and stable  Last Vitals:  Vitals Value Taken Time  BP 121/80 04/03/19 1749  Temp 36.2 C 04/03/19 1748  Pulse 76 04/03/19 1750  Resp 15 04/03/19 1750  SpO2 100 % 04/03/19 1750  Vitals shown include unvalidated device data.  Last Pain:  Vitals:   04/03/19 1310  TempSrc: Temporal  PainSc: 0-No pain         Complications: No apparent anesthesia complications

## 2019-04-03 NOTE — Op Note (Signed)
OPERATIVE NOTE  DATE OF SURGERY:  04/03/2019  PATIENT NAME:  Debra Patel   DOB: [birth year]  MRN: [id number]  PRE-OPERATIVE DIAGNOSIS: Degenerative arthrosis of the left knee, primary  POST-OPERATIVE DIAGNOSIS:  Same  PROCEDURE:  Left total knee arthroplasty using computer-assisted navigation  SURGEON:  Marciano Sequin. M.D.  ASSISTANT: Maximino Greenland, RN (present and scrubbed throughout the case, critical for assistance with exposure, retraction, instrumentation, and closure)  ANESTHESIA: spinal  ESTIMATED BLOOD LOSS: 50 mL  FLUIDS REPLACED: 1700 mL of crystalloid  TOURNIQUET TIME: 86 minutes  DRAINS: 2 medium Hemovac drains  SOFT TISSUE RELEASES: Anterior cruciate ligament, posterior cruciate ligament, deep medial collateral ligament, patellofemoral ligament  IMPLANTS UTILIZED: DePuy Attune size 6N posterior stabilized femoral component (cemented), size 6 rotating platform tibial component (cemented), 38 mm medialized dome patella (cemented), and a 10 mm stabilized rotating platform polyethylene insert.  INDICATIONS FOR SURGERY: Debra Patel is a 78 y.o. year old female with a long history of progressive knee pain. X-rays demonstrated severe degenerative changes in tricompartmental fashion. The patient had not seen any significant improvement despite conservative nonsurgical intervention. After discussion of the risks and benefits of surgical intervention, the patient expressed understanding of the risks benefits and agree with plans for total knee arthroplasty.   The risks, benefits, and alternatives were discussed at length including but not limited to the risks of infection, bleeding, nerve injury, stiffness, blood clots, the need for revision surgery, cardiopulmonary complications, among others, and they were willing to proceed.  PROCEDURE IN DETAIL: The patient was brought into the operating room and, after adequate spinal anesthesia was achieved, a tourniquet was  placed on the patient's upper thigh. The patient's knee and leg were cleaned and prepped with alcohol and DuraPrep and draped in the usual sterile fashion. A "timeout" was performed as per usual protocol. The lower extremity was exsanguinated using an Esmarch, and the tourniquet was inflated to 300 mmHg. An anterior longitudinal incision was made followed by a standard mid vastus approach. The deep fibers of the medial collateral ligament were elevated in a subperiosteal fashion off of the medial flare of the tibia so as to maintain a continuous soft tissue sleeve. The patella was subluxed laterally and the patellofemoral ligament was incised. Inspection of the knee demonstrated severe degenerative changes with full-thickness loss of articular cartilage. Osteophytes were debrided using a rongeur. Anterior and posterior cruciate ligaments were excised. Two 4.0 mm Schanz pins were inserted in the femur and into the tibia for attachment of the array of trackers used for computer-assisted navigation. Hip center was identified using a circumduction technique. Distal landmarks were mapped using the computer. The distal femur and proximal tibia were mapped using the computer. The distal femoral cutting guide was positioned using computer-assisted navigation so as to achieve a 5 distal valgus cut. The femur was sized and it was felt that a size 6N femoral component was appropriate. A size 6 femoral cutting guide was positioned and the anterior cut was performed and verified using the computer. This was followed by completion of the posterior and chamfer cuts. Femoral cutting guide for the central box was then positioned in the center box cut was performed.  Attention was then directed to the proximal tibia. Medial and lateral menisci were excised. The extramedullary tibial cutting guide was positioned using computer-assisted navigation so as to achieve a 0 varus-valgus alignment and 3 posterior slope. The cut was  performed and verified using the computer. The proximal tibia was  sized and it was felt that a size 6 tibial tray was appropriate. Tibial and femoral trials were inserted followed by insertion of a 10 mm polyethylene insert. This allowed for excellent mediolateral soft tissue balancing both in flexion and in full extension. Finally, the patella was cut and prepared so as to accommodate a 38 mm medialized dome patella. A patella trial was placed and the knee was placed through a range of motion with excellent patellar tracking appreciated. The femoral trial was removed after debridement of posterior osteophytes. The central post-hole for the tibial component was reamed followed by insertion of a keel punch. Tibial trials were then removed. Cut surfaces of bone were irrigated with copious amounts of normal saline with antibiotic solution using pulsatile lavage and then suctioned dry. Polymethylmethacrylate cement was prepared in the usual fashion using a vacuum mixer. Cement was applied to the cut surface of the proximal tibia as well as along the undersurface of a size 6 rotating platform tibial component. Tibial component was positioned and impacted into place. Excess cement was removed using [employer] fast streamer. Cement was then applied to the cut surfaces of the femur as well as along the posterior flanges of the size 6N femoral component. The femoral component was positioned and impacted into place. Excess cement was removed using [employer] fast streamer. A 10 mm polyethylene trial was inserted and the knee was brought into full extension with steady axial compression applied. Finally, cement was applied to the backside of a 38 mm medialized dome patella and the patellar component was positioned and patellar clamp applied. Excess cement was removed using [employer] fast streamer. After adequate curing of the cement, the tourniquet was deflated after a total tourniquet time of 86 minutes. Hemostasis was achieved using electrocautery.  The knee was irrigated with copious amounts of normal saline with antibiotic solution using pulsatile lavage and then suctioned dry. 20 mL of 1.3% Exparel and 60 mL of 0.25% Marcaine in 40 mL of normal saline was injected along the posterior capsule, medial and lateral gutters, and along the arthrotomy site. A 10 mm stabilized rotating platform polyethylene insert was inserted and the knee was placed through a range of motion with excellent mediolateral soft tissue balancing appreciated and excellent patellar tracking noted. 2 medium drains were placed in the wound bed and brought out through separate stab incisions. The medial parapatellar portion of the incision was reapproximated using interrupted sutures of #1 Vicryl. Subcutaneous tissue was approximated in layers using first #0 Vicryl followed #2-0 Vicryl. The skin was approximated with skin staples. A sterile dressing was applied.  The patient tolerated the procedure well and was transported to the recovery room in stable condition.    James P. Holley Bouche., M.D.

## 2019-04-03 NOTE — Anesthesia Postprocedure Evaluation (Signed)
Anesthesia Post Note  Patient: ARTHURINE OLEARY  Procedure(s) Performed: COMPUTER ASSISTED TOTAL KNEE ARTHROPLASTY (Left Knee)  Patient location during evaluation: PACU Anesthesia Type: Spinal Level of consciousness: oriented and awake and alert Pain management: pain level controlled Vital Signs Assessment: post-procedure vital signs reviewed and stable Respiratory status: spontaneous breathing, respiratory function stable and patient connected to nasal cannula oxygen Cardiovascular status: blood pressure returned to baseline and stable Postop Assessment: no headache, no backache and no apparent nausea or vomiting Anesthetic complications: no     Last Vitals:  Vitals:   04/03/19 1903 04/03/19 2032  BP: (!) 125/59 120/60  Pulse: 67 (!) 49  Resp: 16 18  Temp: 36.6 C 36.8 C  SpO2: 96% 100%    Last Pain:  Vitals:   04/03/19 1903  TempSrc: Oral  PainSc:                  Kacper Cartlidge S

## 2019-04-03 NOTE — Anesthesia Procedure Notes (Signed)
Spinal  Patient location during procedure: OR Start time: 04/03/2019 2:21 PM End time: 04/03/2019 2:25 PM Staffing Resident/CRNA: Bernardo Heater, CRNA Performed: resident/CRNA  Preanesthetic Checklist Completed: patient identified, site marked, surgical consent, pre-op evaluation, timeout performed, IV checked, risks and benefits discussed and monitors and equipment checked Spinal Block Patient position: sitting Prep: ChloraPrep Patient monitoring: heart rate, continuous pulse ox, blood pressure and cardiac monitor Approach: midline Location: L3-4 Injection technique: single-shot Needle Needle type: Introducer and Pencan  Needle gauge: 24 G Needle length: 9 cm Additional Notes Negative paresthesia. Negative blood return. Positive free-flowing CSF. Expiration date of kit checked and confirmed. Patient tolerated procedure well, without complications.

## 2019-04-03 NOTE — Anesthesia Post-op Follow-up Note (Signed)
Anesthesia QCDR form completed.        

## 2019-04-03 NOTE — Anesthesia Preprocedure Evaluation (Signed)
Anesthesia Evaluation  Patient identified by MRN, date of birth, ID band Patient awake    Reviewed: Allergy & Precautions, H&P , NPO status , Patient's Chart, lab work & pertinent test results, reviewed documented beta blocker date and time   Airway Mallampati: II   Neck ROM: full    Dental  (+) Poor Dentition   Pulmonary neg pulmonary ROS,    Pulmonary exam normal        Cardiovascular Exercise Tolerance: Poor hypertension, On Medications negative cardio ROS Normal cardiovascular exam Rhythm:regular Rate:Normal     Neuro/Psych Anxiety negative neurological ROS  negative psych ROS   GI/Hepatic negative GI ROS, Neg liver ROS, GERD  Medicated,  Endo/Other  negative endocrine ROSHypothyroidism   Renal/GU Renal diseasenegative Renal ROS  negative genitourinary   Musculoskeletal   Abdominal   Peds  Hematology negative hematology ROS (+)   Anesthesia Other Findings Past Medical History: No date: Arthritis 08/21/2014: BP (high blood pressure) 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 2001: Breast cancer ([place])     Comment:  Left side mastectomy No date: History of kidney stones     Comment:  h/o No date: Hypothyroidism 2001: Personal history of chemotherapy     Comment:  BREAST CA 2001: Personal history of radiation therapy     Comment:  BREAST CA 2001: Radiation     Comment:  BREAST CA 2001: S/P chemotherapy, time since greater than 12 weeks     Comment:  BREAST CA No date: Tachycardia No date: Thyroid disease No date: Tick fever Past Surgical History: No date: ABDOMINAL HYSTERECTOMY 2002: BREAST EXCISIONAL BIOPSY; Right     Comment:  EXCISIONAL - NEG 06/29/2018: INTRAMEDULLARY (IM) NAIL INTERTROCHANTERIC; Left     Comment:  Procedure: INTRAMEDULLARY (IM) NAIL INTERTROCHANTRIC-               [facility];  Surgeon: Lovell Sheehan, MD;  Location: [facility] ORS;              Service: Orthopedics;  Laterality: Left; No date: Mass Removed  from Bladder and Both Ovaries 2001: MASTECTOMY; Left     Comment:  BREAST CA No date: THYROIDECTOMY; Left BMI    Body Mass Index: 25.20 kg/m     Reproductive/Obstetrics negative OB ROS                             Anesthesia Physical Anesthesia Plan  ASA: III  Anesthesia Plan: Spinal   Post-op Pain Management:    Induction:   PONV Risk Score and Plan:   Airway Management Planned:   Additional Equipment:   Intra-op Plan:   Post-operative Plan:   Informed Consent: I have reviewed the patients History and Physical, chart, labs and discussed the procedure including the risks, benefits and alternatives for the proposed anesthesia with the patient or authorized representative who has indicated his/her understanding and acceptance.     Dental Advisory Given  Plan Discussed with: CRNA  Anesthesia Plan Comments:         Anesthesia Quick Evaluation

## 2019-04-03 NOTE — Consult Note (Signed)
PHARMACIST - PHYSICIAN ORDER COMMUNICATION  CONCERNING: P&T Medication Policy on Herbal Medications  DESCRIPTION:  This patient's order for:  Prevagen/Potassium 99mg   has been noted.  This product(s) is classified as an "herbal" or natural product. Due to a lack of definitive safety studies or FDA approval, nonstandard manufacturing practices, plus the potential risk of unknown drug-drug interactions while on inpatient medications, the Pharmacy and Therapeutics Committee does not permit the use of "herbal" or natural products of this type within [facility].   ACTION TAKEN: The pharmacy department is unable to verify this order at this time and your patient has been informed of this safety policy. Please reevaluate patient's clinical condition at discharge and address if the herbal or natural product(s) should be resumed at that time.

## 2019-04-03 NOTE — Plan of Care (Signed)

## 2019-04-03 NOTE — H&P (Signed)
The patient has been re-examined, and the chart reviewed, and there have been no interval changes to the documented history and physical.    The risks, benefits, and alternatives have been discussed at length. The patient expressed understanding of the risks benefits and agreed with plans for surgical intervention.  James P. Hooten, Jr. M.D.    

## 2019-04-04 ENCOUNTER — Encounter: Payer: Self-pay | Admitting: Orthopedic Surgery

## 2019-04-04 MED ORDER — OXYCODONE HCL 5 MG PO TABS: 5.0000 mg | ORAL_TABLET | ORAL | 0 refills | Status: DC | PRN

## 2019-04-04 MED ORDER — ENOXAPARIN SODIUM 40 MG/0.4ML ~~LOC~~ SOLN: 40.0000 mg | SUBCUTANEOUS | 0 refills | Status: DC

## 2019-04-04 MED ORDER — TRAMADOL HCL 50 MG PO TABS: 50.0000 mg | ORAL_TABLET | ORAL | 0 refills | Status: DC | PRN

## 2019-04-04 NOTE — Addendum Note (Signed)
Addendum  created 04/04/19 0830 by Bernardo Heater, CRNA   Attestation recorded in [place], [employer] filed

## 2019-04-04 NOTE — Progress Notes (Signed)
Physical Therapy Treatment Patient Details Name: TEPHANIE ESCORCIA MRN: [id number] DOB: 12-Oct-1940 Today's Date: 04/04/2019    History of Present Illness 78 y/o female here s/p L TKA 04/03/19.    PT Comments    Pt did well with ambulation, mobility and exercises and reports no pain a majority of the time.  She was able to circumambulate the nurses' station and quality cadence and good speed/confidence, had >90 deg of R knee flexion and is showing good quad contraction and strength.  Pt very pleasant and motivated t/o session, generally doing very well POD1.   Follow Up Recommendations  Home health PT     Equipment Recommendations  None recommended by PT    Recommendations for Other Services       Precautions / Restrictions Precautions Precautions: Fall Restrictions Weight Bearing Restrictions: Yes LLE Weight Bearing: Weight bearing as tolerated    Mobility  Bed Mobility Overal bed mobility: Modified Independent             General bed mobility comments: Pt was able to get LEs back into bed w/o assist or hesitation  Transfers Overall transfer level: Modified independent Equipment used: Rolling walker (2 wheeled)             General transfer comment: Pt needing only light reminders to insure appropriate walker use, had good control ascending and descending w/o assist  Ambulation/Gait Ambulation/Gait assistance: Supervision Gait Distance (Feet): 200 Feet Assistive device: Rolling walker (2 wheeled)       General Gait Details: Pt confident and appropriate with ambulation using walker.  Again very short warm up time and quickly able to maintain confident and consistent cadence with continuous walker momentum.  Pt with no LOBs, increased pain or excessive faitgue with circumambulation on the nurses' station.   Stairs             Wheelchair Mobility    Modified Rankin (Stroke Patients Only)       Balance Overall balance assessment: Modified  Independent                                          Cognition Arousal/Alertness: Awake/alert Behavior During Therapy: WFL for tasks assessed/performed Overall Cognitive Status: Within Functional Limits for tasks assessed                                        Exercises Total Joint Exercises Ankle Circles/Pumps: AROM;10 reps Quad Sets: Strengthening;10 reps Short Arc Quad: Strengthening;15 reps Heel Slides: AROM;10 reps Hip ABduction/ADduction: Strengthening;15 reps Straight Leg Raises: 10 reps;Strengthening Knee Flexion: PROM;5 reps Goniometric ROM: >90 flexion Other Exercises Other Exercises: Pt instructed in falls prevention strategies, home/routines modifications for ADL, AE/DME, compression stocking mgt, and polar care mgt    General Comments        Pertinent Vitals/Pain Pain Assessment: No/denies pain Pain Score: 0-No pain(Pt only has pain during ROM tasks) Pain Location: L knee with movement Pain Descriptors / Indicators: Aching Pain Intervention(s): Limited activity within patient's tolerance;Monitored during session;Repositioned;Ice applied    Home Living Family/patient expects to be discharged to:: Private residence Living Arrangements: Alone Available Help at Discharge: Family;Personal care attendant(daughter and sitters will be 24/7 initially) Type of Home: House Home Access: (reports 1 small step, previous notes mention ramp)   Home Layout: Multi-level;Able  to live on main level with bedroom/bathroom Home Equipment: Wheelchair - manual;Bedside commode;Toilet riser;Shower seat;Grab bars - toilet;Walker - 2 wheels;Adaptive equipment;Grab bars - tub/shower;Shower seat - built in      Prior Function Level of Independence: Independent      Comments: Pt is normally able to be pretty acitve and independent, has been using cane some since recovering from tib fracture    PT Goals (current goals can now be found in the care plan  section) Acute Rehab PT Goals Patient Stated Goal: Go home and get back to normal Progress towards PT goals: Progressing toward goals    Frequency    BID      PT Plan Current plan remains appropriate    Co-evaluation              AM-PAC PT "6 Clicks" Mobility   Outcome Measure  Help needed turning from your back to your side while in a flat bed without using bedrails?: None Help needed moving from lying on your back to sitting on the side of a flat bed without using bedrails?: None Help needed moving to and from a bed to a chair (including a wheelchair)?: None Help needed standing up from a chair using your arms (e.g., wheelchair or bedside chair)?: None Help needed to walk in hospital room?: None Help needed climbing 3-5 steps with a railing? : A Little 6 Click Score: 23    End of Session Equipment Utilized During Treatment: Gait belt Activity Tolerance: Patient tolerated treatment well Patient left: with bed alarm set;with call bell/phone within reach   PT Visit Diagnosis: Muscle weakness (generalized) (M62.81);Difficulty in walking, not elsewhere classified (R26.2);Pain Pain - Right/Left: Left Pain - part of body: Knee     Time: 1886-7737 PT Time Calculation (min) (ACUTE ONLY): 29 min  Charges:  $Gait Training: 8-22 mins $Therapeutic Exercise: 8-22 mins                     Kreg Shropshire, DPT 04/04/2019, 3:20 PM

## 2019-04-04 NOTE — Progress Notes (Signed)
   Subjective: 1 Day Post-Op Procedure(s) (LRB): COMPUTER ASSISTED TOTAL KNEE ARTHROPLASTY (Left) Patient reports pain as mild.  Was noted to have pain level of 8 but is now controlled with oral medication Patient is well, and has had no acute complaints or problems We will start therapy today.  Patient slept well during the night.  Voicing no complaints A rolled towel was under operative knee Plan is to go Home after hospital stay. no nausea and no vomiting Patient denies any chest pains or shortness of breath.   Objective: Vital signs in last 24 hours: Temp:  [97.1 F (36.2 C)-98.7 F (37.1 C)] 98.2 F (36.8 C) (07/14 0434) Pulse Rate:  [49-78] 55 (07/14 0434) Resp:  [13-25] 18 (07/14 0434) BP: (97-145)/(59-100) 97/59 (07/14 0434) SpO2:  [95 %-100 %] 98 % (07/14 0434) Weight:  [66.6 kg] 66.6 kg (07/13 1310) Heels are non tender and elevated off the bed using rolled towels  Intake/Output from previous day: 07/13 0701 - 07/14 0700 In: 1770 [P.O.:100; I.V.:1470; IV Piggyback:200] Out: 500 [Urine:450; Blood:50] Intake/Output this shift: No intake/output data recorded.  No results for input(s): HGB in the last 72 hours. No results for input(s): WBC, RBC, HCT, PLT in the last 72 hours. No results for input(s): NA, K, CL, CO2, BUN, CREATININE, GLUCOSE, CALCIUM in the last 72 hours. No results for input(s): LABPT, INR in the last 72 hours.  EXAM General - Patient is Alert, Appropriate and Oriented Extremity - Neurologically intact Neurovascular intact Sensation intact distally Intact pulses distally Dorsiflexion/Plantar flexion intact Compartment soft Dressing - dressing C/D/I Motor Function - intact, moving foot and toes well on exam.    Past Medical History:  Diagnosis Date  . Arthritis   . BP (high blood pressure) 08/21/2014  . Brain infection 2008  . Breast cancer ([place]) 2001   Left side mastectomy  . History of kidney stones    h/o  . Hypothyroidism   .  Personal history of chemotherapy 2001   BREAST CA  . Personal history of radiation therapy 2001   BREAST CA  . Radiation 2001   BREAST CA  . S/P chemotherapy, time since greater than 12 weeks 2001   BREAST CA  . Tachycardia   . Thyroid disease   . Tick fever     Assessment/Plan: 1 Day Post-Op Procedure(s) (LRB): COMPUTER ASSISTED TOTAL KNEE ARTHROPLASTY (Left) Active Problems:   Total knee replacement status  Estimated body mass index is 25.2 kg/m as calculated from the following:   Height as of this encounter: 5\' 4"  (1.626 m).   Weight as of this encounter: 66.6 kg. Advance diet Up with therapy D/C IV fluids Plan for discharge tomorrow Discharge home with home health  Labs: None DVT Prophylaxis - Lovenox, Foot Pumps and TED hose Weight-Bearing as tolerated to left leg D/C O2 and Pulse OX and try on Room Air Begin working on bowel movement  Jon R. Sweetser [place] 04/04/2019, 7:41 AM

## 2019-04-04 NOTE — Evaluation (Signed)
Physical Therapy Evaluation Patient Details Name: Debra Patel MRN: [id number] DOB: 09-28-1940 Today's Date: 04/04/2019   History of Present Illness  78 y/o female here s/p L TKA 04/03/19.  Clinical Impression  Pt did well with PT exam and was able to show good safety and relative confidence with exercises, mobility and ambulation.  She was able to do AROM SLRs with minimal warm up, had nearly 90 deg of flexion and was able to ambulation ~75 ft with confidence and consistent cadence.  Overall did very well for first post-op PT session.    Follow Up Recommendations Home health PT    Equipment Recommendations  None recommended by PT    Recommendations for Other Services       Precautions / Restrictions Precautions Precautions: Fall Restrictions Weight Bearing Restrictions: Yes LLE Weight Bearing: Weight bearing as tolerated      Mobility  Bed Mobility Overal bed mobility: Modified Independent             General bed mobility comments: Pt able to get herself to sitting EOB w/o assist  Transfers Overall transfer level: Modified independent Equipment used: Rolling walker (2 wheeled)             General transfer comment: Minimal cuing and CGA needed to rise to standing, able to control descent (commode and recliner) with L LE slighly extended and using UEs  Ambulation/Gait Ambulation/Gait assistance: Supervision Gait Distance (Feet): 75 Feet Assistive device: Rolling walker (2 wheeled)       General Gait Details: Pt confident and appropriate with ambulation using walker.  She had very short warm up time and quickly was able to maintain confident and consistent cadence with maintained walker momentum.  Pt with no LOBs, increased pain or excessive faitgue with the effort.   Stairs            Wheelchair Mobility    Modified Rankin (Stroke Patients Only)       Balance Overall balance assessment: Modified Independent                                            Pertinent Vitals/Pain Pain Assessment: (very minimal pain at rest, <5/10 with ROM/exercises)    Home Living Family/patient expects to be discharged to:: Private residence Living Arrangements: Alone Available Help at Discharge: Family;Personal care attendant(daughter and sitters will be 24/7 initially) Type of Home: House Home Access: (reports 1 small step, previous notes mention ramp)     Home Layout: Multi-level;Able to live on main level with bedroom/bathroom Home Equipment: Wheelchair - manual;Bedside commode;Toilet riser;Shower seat;Grab bars - toilet;Walker - 2 wheels      Prior Function Level of Independence: Independent         Comments: Pt is normally able to be pretty acitve and independent, has been using cane some since recovering from tib fracture      Hand Dominance        Extremity/Trunk Assessment   Upper Extremity Assessment Upper Extremity Assessment: Overall WFL for tasks assessed    Lower Extremity Assessment Lower Extremity Assessment: Overall WFL for tasks assessed(expected L LE post-op weakness, but able to SLR, etc)       Communication   Communication: No difficulties  Cognition Arousal/Alertness: Awake/alert Behavior During Therapy: WFL for tasks assessed/performed Overall Cognitive Status: Within Functional Limits for tasks assessed  General Comments      Exercises Total Joint Exercises Ankle Circles/Pumps: AROM;10 reps Quad Sets: Strengthening;10 reps Short Arc Quad: Strengthening;10 reps Heel Slides: AROM;10 reps(resisted leg extensions) Hip ABduction/ADduction: Strengthening;10 reps Straight Leg Raises: AROM;10 reps Knee Flexion: PROM Goniometric ROM: 1-88   Assessment/Plan    PT Assessment Patient needs continued PT services  PT Problem List Decreased strength;Decreased range of motion;Decreased balance;Decreased activity tolerance;Decreased  mobility;Decreased coordination;Decreased knowledge of use of DME;Decreased safety awareness;Pain;Decreased knowledge of precautions       PT Treatment Interventions DME instruction;Gait training;Stair training;Functional mobility training;Therapeutic activities;Therapeutic exercise;Balance training;Neuromuscular re-education;Patient/family education    PT Goals (Current goals can be found in the Care Plan section)  Acute Rehab PT Goals Patient Stated Goal: Go home and get back to normal PT Goal Formulation: With patient Time For Goal Achievement: 04/18/19 Potential to Achieve Goals: Good    Frequency BID   Barriers to discharge        Co-evaluation               AM-PAC PT "6 Clicks" Mobility  Outcome Measure Help needed turning from your back to your side while in a flat bed without using bedrails?: None Help needed moving from lying on your back to sitting on the side of a flat bed without using bedrails?: None Help needed moving to and from a bed to a chair (including a wheelchair)?: None Help needed standing up from a chair using your arms (e.g., wheelchair or bedside chair)?: None Help needed to walk in hospital room?: None Help needed climbing 3-5 steps with a railing? : A Little 6 Click Score: 23    End of Session Equipment Utilized During Treatment: Gait belt Activity Tolerance: Patient tolerated treatment well Patient left: with chair alarm set;with call bell/phone within reach Nurse Communication: Mobility status PT Visit Diagnosis: Muscle weakness (generalized) (M62.81);Difficulty in walking, not elsewhere classified (R26.2);Pain Pain - Right/Left: Left Pain - part of body: Knee    Time: 1103-1594 PT Time Calculation (min) (ACUTE ONLY): 39 min   Charges:   PT Evaluation $PT Eval Low Complexity: 1 Low PT Treatments $Gait Training: 8-22 mins $Therapeutic Exercise: 8-22 mins        Kreg Shropshire, DPT 04/04/2019, 11:10 AM

## 2019-04-04 NOTE — TOC Initial Note (Signed)
Transition of Care [facility]) - Initial/Assessment Note    Patient Details  Name: Debra Patel MRN: [id number] Date of Birth: 13-May-1941  Transition of Care [facility]) CM/SW Contact:    Su Hilt, RN Phone Number: 04/04/2019, 9:33 AM  Clinical Narrative:                 Met with the patient to discuss DC ploan and needs She lives home alone but will have a sitter and her daughter is going to be helping.  She has a RW and grab bars in the bathroom, no Other DME needs She chose [facility] and I notified Helene Kelp for [facility] PT Her daughter will provide transportation She sees Dr Thornell Mule for PCP and is up to date, she can afford her medications.  I will notify her of the Lovenox price once obtained, She is going to self administer the Lovenox with guidance by the nurse this evening  Expected Discharge Plan: [place] Barriers to Discharge: Continued Medical Work up   Patient Goals and CMS Choice Patient states their goals for this hospitalization and ongoing recovery are:: go home CMS Medicare.gov Compare Post Acute Care list provided to:: Patient Choice offered to / list presented to : Patient  Expected Discharge Plan and Services Expected Discharge Plan: [place]   Discharge Planning Services: CM Consult Post Acute Care Choice: [place] arrangements for the past 2 months: Single Family Home                 DME Arranged: N/A         HH Arranged: PT HH Agency: [facility] [facility] [employer] Date [place]: 04/04/19 Time [place]: [phone] Representative spoke with at [place]: [place] Arrangements/Services Living arrangements for the past 2 months: [place] Lives with:: Self(daughter and sitters to stay with her) Patient language and need for interpreter reviewed:: No Do you feel safe going back to the place where you live?: Yes      Need for Family Participation in Patient  Care: No (Comment) Care giver support system in place?: Yes (comment) Current home services: DME(RW and grab bars) Criminal Activity/Legal Involvement Pertinent to Current Situation/Hospitalization: No - Comment as needed  Activities of Daily Living Home Assistive Devices/Equipment: Contact lenses ADL Screening (condition at time of admission) Patient's cognitive ability adequate to safely complete daily activities?: Yes Is the patient deaf or have difficulty hearing?: No Does the patient have difficulty seeing, even when wearing glasses/contacts?: No Does the patient have difficulty concentrating, remembering, or making decisions?: No Patient able to express need for assistance with ADLs?: Yes Does the patient have difficulty dressing or bathing?: No Independently performs ADLs?: Yes (appropriate for developmental age) Does the patient have difficulty walking or climbing stairs?: Yes Weakness of Legs: Left Weakness of Arms/Hands: None  Permission Sought/Granted Permission sought to share information with : Case Manager Permission granted to share information with : Yes, Verbal Permission Granted     Permission granted to share info w AGENCY: [facility]        Emotional Assessment Appearance:: Appears stated age Attitude/Demeanor/Rapport: Engaged Affect (typically observed): Accepting, Appropriate, Happy, Hopeful, Pleasant Orientation: : Oriented to Self Alcohol / Substance Use: Not Applicable Psych Involvement: No (comment)  Admission diagnosis:  PRIMARY OSTEOARTHRITIS OF LEFT KNEE Patient Active Problem List   Diagnosis Date Noted  . 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 Nephrolithiasis 02/23/2019  .  Vertigo 10/09/2018  . Fracture of neck of femur ([place]) 09/05/2018  . Syncope 06/29/2018  . Allergy to alpha-gal 12/16/2017  . 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 GERD (gastroesophageal reflux disease) 04/13/2017  . Constipation, chronic 03/05/2016  . Fever 03/04/2015  .  Febrile 03/04/2015  . Chest pain with low risk for cardiac etiology 02/28/2015  . History of pulmonary embolism 01/20/2015  . Anxiety 10/23/2014  . Amnesia 10/23/2014  . Mild cognitive disorder 10/23/2014  . Hypertension 08/21/2014  . Primary osteoarthritis of left knee 04/06/2014  . Adult hypothyroidism 11/24/2013  . Palpitations 11/24/2013  . Mass of pelvis 11/15/2013  . Bad memory 07/27/2012  . Complete rupture of rotator cuff 10/26/2011  . Breast cancer, left ([place]) 04/21/2000   PCP:  Luella Cook, NP Pharmacy:   [facility] [place], [place], [place] [address] Phone: [phone] Fax: [place], [place] [address] Phone: [phone] Fax: [phone]  [facility] [address] [place] [address] Phone: [phone] Fax: [phone]  [id number] [id number]: [phone]: [phone]    Social Determinants of Health (SDOH) Interventions    Readmission Risk Interventions No flowsheet data found.

## 2019-04-04 NOTE — TOC Progression Note (Signed)
Transition of Care [facility]) - Progression Note    Patient Details  Name: Debra Patel MRN: [id number] Date of Birth: 1941-02-22  Transition of Care [facility]) CM/SW [place], RN Phone Number: 04/04/2019, 8:24 AM  Clinical Narrative:     Requested the price of Lovenox, will notify the patient once obtained      Expected Discharge Plan and Services                                                 Social Determinants of Health (SDOH) Interventions    Readmission Risk Interventions No flowsheet data found.

## 2019-04-04 NOTE — Discharge Summary (Signed)
Physician Discharge Summary  Patient ID: Debra Patel MRN: [id number] DOB/AGE: 78-May-1942 78 y.o.  Admit date: 04/03/2019 Discharge date: 04/05/2019  Admission Diagnoses:  PRIMARY OSTEOARTHRITIS OF LEFT KNEE   Discharge Diagnoses: Patient Active Problem List   Diagnosis Date Noted  . 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 Nephrolithiasis 02/23/2019  . Vertigo 10/09/2018  . Fracture of neck of femur ([place]) 09/05/2018  . Syncope 06/29/2018  . Allergy to alpha-gal 12/16/2017  . 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 GERD (gastroesophageal reflux disease) 04/13/2017  . Constipation, chronic 03/05/2016  . Fever 03/04/2015  . Febrile 03/04/2015  . Chest pain with low risk for cardiac etiology 02/28/2015  . History of pulmonary embolism 01/20/2015  . Anxiety 10/23/2014  . Amnesia 10/23/2014  . Mild cognitive disorder 10/23/2014  . Hypertension 08/21/2014  . Primary osteoarthritis of left knee 04/06/2014  . Adult hypothyroidism 11/24/2013  . Palpitations 11/24/2013  . Mass of pelvis 11/15/2013  . Bad memory 07/27/2012  . Complete rupture of rotator cuff 10/26/2011  . Breast cancer, left ([place]) 04/21/2000    Past Medical History:  Diagnosis Date  . Arthritis   . BP (high blood pressure) 08/21/2014  . Brain infection 2008  . Breast cancer ([place]) 2001   Left side mastectomy  . History of kidney stones    h/o  . Hypothyroidism   . Personal history of chemotherapy 2001   BREAST CA  . Personal history of radiation therapy 2001   BREAST CA  . Radiation 2001   BREAST CA  . S/P chemotherapy, time since greater than 12 weeks 2001   BREAST CA  . Tachycardia   . Thyroid disease   . Tick fever      Transfusion: No transfusions during this admission   Consultants (if any): None  Discharged Condition: Improved  Hospital Course: Debra Patel is an 78 y.o. female who was admitted 04/03/2019 with a diagnosis of degenerative arthrosis left knee and went to the operating room  on 04/03/2019 and underwent the above named procedures.    Surgeries:Procedure(s): COMPUTER ASSISTED TOTAL KNEE ARTHROPLASTY on 04/03/2019  PRE-OPERATIVE DIAGNOSIS: Degenerative arthrosis of the left knee, primary  POST-OPERATIVE DIAGNOSIS:  Same  PROCEDURE:  Left total knee arthroplasty using computer-assisted navigation  SURGEON:  Marciano Sequin. M.D.  ASSISTANT: Maximino Greenland, RN (present and scrubbed throughout the case, critical for assistance with exposure, retraction, instrumentation, and closure)  ANESTHESIA: spinal  ESTIMATED BLOOD LOSS: 50 mL  FLUIDS REPLACED: 1700 mL of crystalloid  TOURNIQUET TIME: 86 minutes  DRAINS: 2 medium Hemovac drains  SOFT TISSUE RELEASES: Anterior cruciate ligament, posterior cruciate ligament, deep medial collateral ligament, patellofemoral ligament  IMPLANTS UTILIZED: DePuy Attune size 6N posterior stabilized femoral component (cemented), size 6 rotating platform tibial component (cemented), 38 mm medialized dome patella (cemented), and a 10 mm stabilized rotating platform polyethylene insert.  INDICATIONS FOR SURGERY: Debra Patel is a 78 y.o. year old female with a long history of progressive knee pain. X-rays demonstrated severe degenerative changes in tricompartmental fashion. The patient had not seen any significant improvement despite conservative nonsurgical intervention. After discussion of the risks and benefits of surgical intervention, the patient expressed understanding of the risks benefits and agree with plans for total knee arthroplasty.   The risks, benefits, and alternatives were discussed at length including but not limited to the risks of infection, bleeding, nerve injury, stiffness, blood clots, the need for revision surgery, cardiopulmonary complications, among others, and they were willing to proceed.  Patient tolerated the surgery well. No complications .Patient was taken to PACU where she was stabilized  and then transferred to the orthopedic floor.  Patient started on Lovenox 30 mg q 12 hrs. Foot pumps applied bilaterally at 80 mm hgb. Heels elevated off bed with rolled towels. No evidence of DVT. Calves non tender. Negative Homan. Physical therapy started on day #1 for gait training and transfer with OT starting on  day #1 for ADL and assisted devices. Patient has done well with therapy. Ambulated greater than 200 feet upon being discharged.  Was able to ascend and descend 4 steps safely and independently  Patient's IV And Foley were discontinued on day #1 with Hemovac being discontinued on day #2. Dressing was changed on day 2 prior to patient being discharged   She was given perioperative antibiotics:  Anti-infectives (From admission, onward)   Start     Dose/Rate Route Frequency Ordered Stop   04/03/19 2000  ceFAZolin (ANCEF) IVPB 2g/100 mL premix     2 g 200 mL/hr over 30 Minutes Intravenous Every 6 hours 04/03/19 1848 04/04/19 1959   04/03/19 1255  ceFAZolin (ANCEF) 2-4 GM/100ML-% IVPB    Note to Pharmacy: Myles Lipps   : cabinet override      04/03/19 1255 04/03/19 1432   04/03/19 0600  ceFAZolin (ANCEF) IVPB 2g/100 mL premix     2 g 200 mL/hr over 30 Minutes Intravenous On call to O.R. 04/02/19 2149 04/03/19 1442    .  She was fitted with AV 1 compression foot pump devices, instructed on heel pumps, early ambulation, and fitted with TED stockings bilaterally for DVT prophylaxis.  She benefited maximally from the hospital stay and there were no complications.    Recent vital signs:  Vitals:   04/04/19 0434 04/04/19 0742  BP: (!) 97/59 116/61  Pulse: (!) 55 (!) 51  Resp: 18 18  Temp: 98.2 F (36.8 C) 98.5 F (36.9 C)  SpO2: 98% 99%    Recent laboratory studies:  Lab Results  Component Value Date   HGB 13.3 03/29/2019   HGB 9.6 (L) 07/01/2018   HGB 10.6 (L) 06/30/2018   Lab Results  Component Value Date   WBC 5.0 03/29/2019   PLT 188 03/29/2019   Lab  Results  Component Value Date   INR 1.1 03/29/2019   Lab Results  Component Value Date   NA 142 03/29/2019   K 3.5 03/29/2019   CL 105 03/29/2019   CO2 28 03/29/2019   BUN 15 03/29/2019   CREATININE 0.66 03/29/2019   GLUCOSE 83 03/29/2019    Discharge Medications:   Allergies as of 04/04/2019      Reactions   Pyridoxine Anaphylaxis   Hydrocodone Nausea Only, Nausea And Vomiting   Alpha-gal Other (See Comments)   Tick bite, sores on body   Bee Venom Other (See Comments)   Motrin [ibuprofen] Other (See Comments)   depression   Oxycodone Nausea Only   Latex Rash   NEGATIVE Latex IgE (<0.10) on 03/29/2019   Nickel Rash   Nitrofurantoin Itching      Medication List    TAKE these medications   CLEAR EYES MAX REDNESS RELIEF OP Apply 2 drops to eye as needed.   docusate sodium 100 MG capsule Commonly known as: COLACE Take 1 capsule by mouth daily as needed for constipation.   enoxaparin 40 MG/0.4ML injection Commonly known as: LOVENOX Inject 0.4 mLs (40 mg total) into the skin daily for 14 days. Start  taking on: April 06, 2019   hydrocortisone cream 1 % Apply 1 application topically as needed for itching.   levothyroxine 50 MCG tablet Commonly known as: SYNTHROID Take 50 mcg by mouth daily before breakfast.   metoprolol succinate 25 MG 24 hr tablet Commonly known as: TOPROL-XL Take 25 mg by mouth daily as needed (for palpitations).   oxyCODONE 5 MG immediate release tablet Commonly known as: Oxy IR/ROXICODONE Take 1 tablet (5 mg total) by mouth every 4 (four) hours as needed for moderate pain (pain score 4-6).   oxymetazoline 0.05 % nasal spray Commonly known as: AFRIN Place 2 sprays into both nostrils as needed for congestion.   Potassium 99 MG Tabs Take 99 mg by mouth daily.   Prevagen Extra Strength 20 MG Caps Generic drug: Apoaequorin Take 20 mg by mouth daily.   traMADol 50 MG tablet Commonly known as: ULTRAM Take 1-2 tablets (50-100 mg total) by  mouth every 4 (four) hours as needed for moderate pain.   TRUBIOTICS PO Take 1 capsule by mouth daily.   Visine For Contacts Soln 2 drops by Does not apply route as needed.   VITAMIN B 12 PO Take 1 spray by mouth daily. 5000 mcg   vitamin C 500 MG tablet Commonly known as: ASCORBIC ACID Take 500 mg by mouth daily.   Vitamin D3 25 MCG (1000 UT) Caps Take 1,000 Units by mouth daily.   vitamin E 200 UNIT capsule Take 200 Units by mouth daily.            Durable Medical Equipment  (From admission, onward)         Start     Ordered   04/03/19 1849  DME Walker rolling  Once    Question:  Patient needs a walker to treat with the following condition  Answer:  Total knee replacement status   04/03/19 1848   04/03/19 1849  DME Bedside commode  Once    Question:  Patient needs a bedside commode to treat with the following condition  Answer:  Total knee replacement status   04/03/19 1848          Diagnostic Studies: Dg Knee Left Port  Result Date: 04/03/2019 CLINICAL DATA:  Status post left knee replacement EXAM: PORTABLE LEFT KNEE - 1-2 VIEW COMPARISON:  11/13/2018 FINDINGS: Left knee prosthesis is noted. Surgical drains are identified. Medullary rod is again seen in the distal femur. No acute soft tissue abnormality is noted. IMPRESSION: Status post left knee replacement. Electronically Signed   By: Inez Catalina M.D.   On: 04/03/2019 18:11    Disposition:   Discharge Instructions    Increase activity slowly   Complete by: As directed       Follow-up Information    Watt Climes, PA On 04/18/2019.   Specialty: Physician Assistant Why: at 10:15am Contact information: [place] [address] [phone]        Dereck Leep, MD On 05/16/2019.   Specialty: Orthopedic Surgery Why: at 11:15am Contact information: [place] [address] [phone]            Signed: Watt Climes 04/04/2019,  7:47 AM

## 2019-04-04 NOTE — TOC Benefit Eligibility Note (Signed)
Transition of Care [facility]) Benefit Eligibility Note    Patient Details  Name: Debra Patel MRN: [id number] Date of Birth: [birth year]   Medication/Dose: Lovenox 40mg  once daily for 14 days  Covered?: No  Prescription Coverage Preferred Pharmacy: Any retail pharmacy - [facility], [facility], and [place] listed in chart.  Spoke with Person/Company/Phone Number:: Raitalyn with [employer] at [phone]   Prior Approval: Yes(PA required for name brand: [phone])  Deductible: (No deductible)  Additional Notes: Generic Enoxaparin covered with no PA required.  Considered Tier 4.  Estimated copay: $80Dannette Barbara Phone Number: [phone] or [phone] 04/04/2019, 9:25 AM

## 2019-04-04 NOTE — Evaluation (Signed)
Occupational Therapy Evaluation Patient Details Name: Debra Patel MRN: [id number] DOB: November 07, 1940 Today's Date: 04/04/2019    History of Present Illness 78 y/o female here s/p L TKA 04/03/19.   Clinical Impression   Pt seen for OT evaluation this date, POD#1 from above surgery. Pt was independent in all ADLs prior to surgery, however using a 2WW for mobility due to L knee pain. Pt is eager to return to PLOF with less pain and improved safety and independence. Pt currently requires supervision to PRN minimal assist for LB dressing while in seated position due to pain and limited AROM of L knee. Pt instructed in polar care mgt, falls prevention strategies, home/routines modifications, DME/AE for LB bathing and dressing tasks, and compression stocking mgt. Pt would benefit from skilled OT services including additional instruction in dressing techniques with or without assistive devices for dressing and bathing skills to support recall and carryover prior to discharge and ultimately to maximize safety, independence, and minimize falls risk and caregiver burden. Recommend HHOT following this hospitalization.      Follow Up Recommendations  Home health OT    Equipment Recommendations  None recommended by OT    Recommendations for Other Services       Precautions / Restrictions Precautions Precautions: Fall Restrictions Weight Bearing Restrictions: Yes LLE Weight Bearing: Weight bearing as tolerated      Mobility Bed Mobility             General bed mobility comments: deferred, received in recliner  Transfers Overall transfer level: Modified independent Equipment used: Rolling walker (2 wheeled)             General transfer comment: Minimal cuing and CGA needed to rise to standing, able to control descent (commode and recliner) with L LE slighly extended and using UEs    Balance Overall balance assessment: Modified Independent                                          ADL either performed or assessed with clinical judgement   ADL Overall ADL's : Needs assistance/impaired                                       General ADL Comments: Supervision to PRN Min A for LB ADL tasks     Vision Baseline Vision/History: Wears glasses Wears Glasses: At all times Patient Visual Report: No change from baseline Vision Assessment?: No apparent visual deficits     Perception     Praxis      Pertinent Vitals/Pain Pain Assessment: 0-10 Pain Score: 1  Pain Location: L knee with movement Pain Descriptors / Indicators: Aching Pain Intervention(s): Limited activity within patient's tolerance;Monitored during session;Repositioned;Ice applied     Hand Dominance Right   Extremity/Trunk Assessment Upper Extremity Assessment Upper Extremity Assessment: Overall WFL for tasks assessed   Lower Extremity Assessment Lower Extremity Assessment: Defer to PT evaluation(expected post-op strength/ROM deficits in LLE)   Cervical / Trunk Assessment Cervical / Trunk Assessment: Normal   Communication Communication Communication: No difficulties   Cognition Arousal/Alertness: Awake/alert Behavior During Therapy: WFL for tasks assessed/performed Overall Cognitive Status: Within Functional Limits for tasks assessed  General Comments       Exercises Other Exercises Other Exercises: Pt instructed in falls prevention strategies, home/routines modifications for ADL, AE/DME, compression stocking mgt, and polar care mgt   Shoulder Instructions      Home Living Family/patient expects to be discharged to:: Private residence Living Arrangements: Alone Available Help at Discharge: Family;Personal care attendant(daughter and sitters will be 24/7 initially) Type of Home: House Home Access: (reports 1 small step, previous notes mention ramp)     Home Layout: Multi-level;Able to live on main  level with bedroom/bathroom     Bathroom Shower/Tub: Occupational psychologist: Standard(rails/riser on top)     Home Equipment: Wheelchair - manual;Bedside commode;Toilet riser;Shower seat;Grab bars - toilet;Walker - 2 wheels;Adaptive equipment;Grab bars - tub/shower;Shower seat - built in [employer]: [place]acher        Prior Functioning/Environment Level of Independence: Independent        Comments: Pt is normally able to be pretty acitve and independent, has been using cane some since recovering from tib fracture         OT Problem List: Decreased range of motion;Decreased strength;Pain;Impaired balance (sitting and/or standing);Decreased knowledge of use of DME or AE      OT Treatment/Interventions: Self-care/ADL training;Therapeutic exercise;Therapeutic activities;DME and/or AE instruction;Patient/family education;Balance training    OT Goals(Current goals can be found in the care plan section) Acute Rehab OT Goals Patient Stated Goal: Go home and get back to normal OT Goal Formulation: With patient Time For Goal Achievement: 04/18/19 Potential to Achieve Goals: Good ADL Goals Pt Will Perform Lower Body Dressing: with min guard assist;sit to/from stand;with adaptive equipment Pt Will Transfer to Toilet: with supervision;ambulating(toilet w/ rails, LRAD for amb) Additional ADL Goal #1: Pt will independently instruct family/caregiver in polar care mgt Additional ADL Goal #2: Pt will independently instruct family/caregiver in compression stocking mgt  OT Frequency: Min 1X/week   Barriers to D/C:            Co-evaluation              AM-PAC OT "6 Clicks" Daily Activity     Outcome Measure Help from another person eating meals?: None Help from another person taking care of personal grooming?: None Help from another person toileting, which includes using toliet, bedpan, or urinal?: None Help from another person bathing (including washing, rinsing,  drying)?: A Little Help from another person to put on and taking off regular upper body clothing?: None Help from another person to put on and taking off regular lower body clothing?: A Little 6 Click Score: 22   End of Session    Activity Tolerance: Patient tolerated treatment well Patient left: in chair;with call bell/phone within reach;with chair alarm set  OT Visit Diagnosis: Other abnormalities of gait and mobility (R26.89);Repeated falls (R29.6);Muscle weakness (generalized) (M62.81);Pain Pain - Right/Left: Left Pain - part of body: Knee                Time: 8250-0370 OT Time Calculation (min): 27 min Charges:  OT General Charges $OT Visit: 1 Visit OT Evaluation $OT Eval Low Complexity: 1 Low OT Treatments $Self Care/Home Management : 8-22 mins   Jeni Salles, MPH, MS, OTR/L ascom [phone] 04/04/19, 1:25 PM

## 2019-04-05 NOTE — Progress Notes (Signed)
Patient discharging today to home with daughter providing transportation. All personal belongings's with patient. Discharge instructions and medications reviewed with patient.  Time next meds due reviewed with patient.  Dressing changed to left knee.. Incision well approximated with surgical clips in place. No noted drainage. New dressing applied.   Polar pak home with patient.   DME at home for patient per case manager.   Patient advised to notify physician or PA at office if any questions arise prior to office visit. Patient acknowledges.

## 2019-04-05 NOTE — Progress Notes (Signed)
   Subjective: 2 Days Post-Op Procedure(s) (LRB): COMPUTER ASSISTED TOTAL KNEE ARTHROPLASTY (Left) Patient reports pain as 0 on 0-10 scale.   Patient is well, and has had no acute complaints or problems Patient was able ambulate around the nurses test.  Held flexion of greater than 90 degrees.  Still needs to do steps prior to being discharged Plan is to go Home after hospital stay. no nausea and no vomiting Patient denies any chest pains or shortness of breath. Patient rested very well last night voicing no complaints.  Having no pain  Objective: Vital signs in last 24 hours: Temp:  [98 F (36.7 C)-98.5 F (36.9 C)] 98 F (36.7 C) (07/14 2229) Pulse Rate:  [51-79] 79 (07/14 2229) Resp:  [18-19] 19 (07/14 2229) BP: (105-151)/(55-140) 129/63 (07/14 2229) SpO2:  [92 %-100 %] 99 % (07/14 2229) well approximated incision Heels are non tender and elevated off the bed using rolled towels Intake/Output from previous day: 07/14 0701 - 07/15 0700 In: 720 [P.O.:720] Out: 90 [Drains:90] Intake/Output this shift: Total I/O In: -  Out: 90 [Drains:90]  No results for input(s): HGB in the last 72 hours. No results for input(s): WBC, RBC, HCT, PLT in the last 72 hours. No results for input(s): NA, K, CL, CO2, BUN, CREATININE, GLUCOSE, CALCIUM in the last 72 hours. No results for input(s): LABPT, INR in the last 72 hours.  EXAM General - Patient is Alert, Appropriate and Oriented Extremity - Neurologically intact Neurovascular intact Sensation intact distally Intact pulses distally Dorsiflexion/Plantar flexion intact No cellulitis present Compartment soft Dressing - scant drainage Motor Function - intact, moving foot and toes well on exam.    Past Medical History:  Diagnosis Date  . Arthritis   . BP (high blood pressure) 08/21/2014  . Brain infection 2008  . Breast cancer ([place]) 2001   Left side mastectomy  . History of kidney stones    h/o  . Hypothyroidism   . Personal  history of chemotherapy 2001   BREAST CA  . Personal history of radiation therapy 2001   BREAST CA  . Radiation 2001   BREAST CA  . S/P chemotherapy, time since greater than 12 weeks 2001   BREAST CA  . Tachycardia   . Thyroid disease   . Tick fever     Assessment/Plan: 2 Days Post-Op Procedure(s) (LRB): COMPUTER ASSISTED TOTAL KNEE ARTHROPLASTY (Left) Active Problems:   Total knee replacement status  Estimated body mass index is 25.2 kg/m as calculated from the following:   Height as of this encounter: 5\' 4"  (3.086 m).   Weight as of this encounter: 66.6 kg. Up with therapy Discharge home with home health  Labs: None DVT Prophylaxis - Lovenox, Foot Pumps and TED hose Weight-Bearing as tolerated to left leg Patient needs bowel movement as well as needs to do steps with therapy before being discharged today Please wash operative leg, change dressing and apply TED stockings to operative leg Please give patient 2 extra honeycomb dressings to take home  [place]. [place] 04/05/2019, 6:44 AM

## 2019-04-05 NOTE — Progress Notes (Signed)
Bone foam home with patient.

## 2019-04-05 NOTE — Progress Notes (Signed)
Physical Therapy Treatment Patient Details Name: Debra Patel MRN: [id number] DOB: [birth year] Today's Date: 04/05/2019    History of Present Illness 78 y/o female here s/p L TKA 04/03/19.    PT Comments    Stood and ambulated around nursing station and completed stair training with walker and min guard/supervision. .Participated in exercises as described below.  Progressing well towards goals and no further questions at this time.  Anticipate discharge home today.   Follow Up Recommendations  Home health PT     Equipment Recommendations    RW   Recommendations for Other Services       Precautions / Restrictions Precautions Precautions: Fall Restrictions Weight Bearing Restrictions: Yes LLE Weight Bearing: Weight bearing as tolerated    Mobility  Bed Mobility Overal bed mobility: Modified Independent                Transfers Overall transfer level: Modified independent Equipment used: Rolling walker (2 wheeled)                Ambulation/Gait Ambulation/Gait assistance: Supervision Gait Distance (Feet): 200 Feet Assistive device: Rolling walker (2 wheeled)   Gait velocity: decreased       Stairs Stairs: Yes Stairs assistance: Min guard;Supervision Stair Management: Step to pattern Number of Stairs: 4     Wheelchair Mobility    Modified Rankin (Stroke Patients Only)       Balance Overall balance assessment: Modified Independent                                          Cognition Arousal/Alertness: Awake/alert Behavior During Therapy: WFL for tasks assessed/performed Overall Cognitive Status: Within Functional Limits for tasks assessed                                        Exercises Total Joint Exercises Ankle Circles/Pumps: AROM;10 reps;Supine Quad Sets: Strengthening;10 reps;Supine Short Arc Quad: Strengthening;15 reps;Supine Heel Slides: AROM;10 reps;Supine Hip ABduction/ADduction:  Strengthening;15 reps;Supine Straight Leg Raises: 10 reps;Strengthening;Supine Long Arc Quad: Strengthening;10 reps;Seated Knee Flexion: PROM;5 reps;Seated Goniometric ROM: 0-95    General Comments        Pertinent Vitals/Pain Pain Assessment: 0-10 Pain Score: 3  Pain Location: L knee with movement Pain Descriptors / Indicators: Sore Pain Intervention(s): Premedicated before session;Monitored during session;Limited activity within patient's tolerance;Ice applied    Home Living                      Prior Function            PT Goals (current goals can now be found in the care plan section) Progress towards PT goals: Progressing toward goals    Frequency    BID      PT Plan Current plan remains appropriate    Co-evaluation              AM-PAC PT "6 Clicks" Mobility   Outcome Measure  Help needed turning from your back to your side while in a flat bed without using bedrails?: None Help needed moving from lying on your back to sitting on the side of a flat bed without using bedrails?: None Help needed moving to and from a bed to a chair (including a wheelchair)?: None Help needed standing up from a chair  using your arms (e.g., wheelchair or bedside chair)?: None Help needed to walk in hospital room?: None Help needed climbing 3-5 steps with a railing? : A Little 6 Click Score: 23    End of Session Equipment Utilized During Treatment: Gait belt Activity Tolerance: Patient tolerated treatment well Patient left: with bed alarm set;with call bell/phone within reach;in bed   Pain - Right/Left: Left Pain - part of body: Knee     Time: 0910-0930 PT Time Calculation (min) (ACUTE ONLY): 20 min  Charges:  $Gait Training: 8-22 mins                    Chesley Noon, PTA 04/05/19, 9:59 AM

## 2019-04-05 NOTE — TOC Transition Note (Signed)
Transition of Care [facility]) - CM/SW Discharge Note   Patient Details  Name: Debra Patel MRN: [id number] Date of Birth: 02-Jan-1941  Transition of Care [facility]) CM/SW Contact:  Su Hilt, RN Phone Number: 04/05/2019, 8:15 AM   Clinical Narrative:    Patient to discharge home with Home health [facility], [facility] did not have the staffing to support patient to be seen within 3 days of DC. I notified [place] of [facility] and she accepted, the patient has the DME that she will need at home including a RW and grab bars at the toilet.  The copay for Lovenox is 80$ and the patient was made aware, she can afford her medictions   Final next level of care: Home w Home Health Services Barriers to Discharge: Barriers Resolved   Patient Goals and CMS Choice Patient states their goals for this hospitalization and ongoing recovery are:: go home CMS Medicare.gov Compare Post Acute Care list provided to:: Patient Choice offered to / list presented to : Patient  Discharge Placement                       Discharge Plan and Services   Discharge Planning Services: CM Consult Post Acute Care Choice: Home Health          DME Arranged: N/A         HH Arranged: PT, OT [place] Agency: [facility] Date [place]: 04/05/19 Time [place]: [phone] Representative spoke with at [place]: [place] ([place]) Interventions     Readmission Risk Interventions No flowsheet data found.

## 2019-04-13 DIAGNOSIS — H2513 Age-related nuclear cataract, bilateral: Secondary | ICD-10-CM | POA: Diagnosis not present

## 2019-04-13 DIAGNOSIS — H401131 Primary open-angle glaucoma, bilateral, mild stage: Secondary | ICD-10-CM | POA: Diagnosis not present

## 2019-04-18 DIAGNOSIS — M6281 Muscle weakness (generalized): Secondary | ICD-10-CM | POA: Diagnosis not present

## 2019-04-18 DIAGNOSIS — Z96652 Presence of left artificial knee joint: Secondary | ICD-10-CM | POA: Diagnosis not present

## 2019-04-18 DIAGNOSIS — G8929 Other chronic pain: Secondary | ICD-10-CM | POA: Diagnosis not present

## 2019-04-18 DIAGNOSIS — M25662 Stiffness of left knee, not elsewhere classified: Secondary | ICD-10-CM | POA: Diagnosis not present

## 2019-04-18 DIAGNOSIS — M25562 Pain in left knee: Secondary | ICD-10-CM | POA: Diagnosis not present

## 2019-04-20 DIAGNOSIS — M25562 Pain in left knee: Secondary | ICD-10-CM | POA: Diagnosis not present

## 2019-04-20 DIAGNOSIS — G8929 Other chronic pain: Secondary | ICD-10-CM | POA: Diagnosis not present

## 2019-04-20 DIAGNOSIS — M6281 Muscle weakness (generalized): Secondary | ICD-10-CM | POA: Diagnosis not present

## 2019-04-20 DIAGNOSIS — Z96652 Presence of left artificial knee joint: Secondary | ICD-10-CM | POA: Diagnosis not present

## 2019-04-20 DIAGNOSIS — M25662 Stiffness of left knee, not elsewhere classified: Secondary | ICD-10-CM | POA: Diagnosis not present

## 2019-04-25 DIAGNOSIS — M25562 Pain in left knee: Secondary | ICD-10-CM | POA: Diagnosis not present

## 2019-04-25 DIAGNOSIS — G8929 Other chronic pain: Secondary | ICD-10-CM | POA: Diagnosis not present

## 2019-04-25 DIAGNOSIS — Z96652 Presence of left artificial knee joint: Secondary | ICD-10-CM | POA: Diagnosis not present

## 2019-04-25 DIAGNOSIS — M25662 Stiffness of left knee, not elsewhere classified: Secondary | ICD-10-CM | POA: Diagnosis not present

## 2019-04-25 DIAGNOSIS — M6281 Muscle weakness (generalized): Secondary | ICD-10-CM | POA: Diagnosis not present

## 2019-04-27 DIAGNOSIS — M6281 Muscle weakness (generalized): Secondary | ICD-10-CM | POA: Diagnosis not present

## 2019-05-02 DIAGNOSIS — M6281 Muscle weakness (generalized): Secondary | ICD-10-CM | POA: Diagnosis not present

## 2019-05-04 DIAGNOSIS — M6281 Muscle weakness (generalized): Secondary | ICD-10-CM | POA: Diagnosis not present

## 2019-05-19 DIAGNOSIS — M1712 Unilateral primary osteoarthritis, left knee: Secondary | ICD-10-CM | POA: Diagnosis not present

## 2019-06-07 DIAGNOSIS — I10 Essential (primary) hypertension: Secondary | ICD-10-CM | POA: Diagnosis not present

## 2019-06-07 DIAGNOSIS — H2511 Age-related nuclear cataract, right eye: Secondary | ICD-10-CM | POA: Diagnosis not present

## 2019-06-13 ENCOUNTER — Other Ambulatory Visit: Payer: Self-pay

## 2019-06-13 ENCOUNTER — Encounter: Payer: Self-pay | Admitting: *Deleted

## 2019-06-15 ENCOUNTER — Other Ambulatory Visit
Admission: RE | Admit: 2019-06-15 | Discharge: 2019-06-15 | Disposition: A | Discharge status: Home / Self Care | Payer: PPO | Source: Ambulatory Visit | Attending: Ophthalmology | Admitting: Ophthalmology

## 2019-06-15 ENCOUNTER — Other Ambulatory Visit: Payer: Self-pay

## 2019-06-15 DIAGNOSIS — Z01812 Encounter for preprocedural laboratory examination: Secondary | ICD-10-CM | POA: Insufficient documentation

## 2019-06-15 DIAGNOSIS — Z20828 Contact with and (suspected) exposure to other viral communicable diseases: Secondary | ICD-10-CM | POA: Diagnosis not present

## 2019-06-15 NOTE — Discharge Instructions (Signed)

## 2019-06-16 LAB — SARS CORONAVIRUS 2 (TAT 6-24 HRS): SARS Coronavirus 2: NEGATIVE

## 2019-06-19 ENCOUNTER — Ambulatory Visit
Admission: RE | Admit: 2019-06-19 | Discharge: 2019-06-19 | Disposition: A | Discharge status: Home / Self Care | Payer: PPO | Attending: Ophthalmology | Admitting: Ophthalmology

## 2019-06-19 ENCOUNTER — Ambulatory Visit: Payer: PPO | Admitting: Anesthesiology

## 2019-06-19 ENCOUNTER — Encounter
Admission: RE | Disposition: A | Discharge status: Home / Self Care | Payer: Self-pay | Source: Home / Self Care | Attending: Ophthalmology

## 2019-06-19 ENCOUNTER — Other Ambulatory Visit: Payer: Self-pay

## 2019-06-19 DIAGNOSIS — H2511 Age-related nuclear cataract, right eye: Secondary | ICD-10-CM | POA: Insufficient documentation

## 2019-06-19 DIAGNOSIS — Z96649 Presence of unspecified artificial hip joint: Secondary | ICD-10-CM | POA: Insufficient documentation

## 2019-06-19 DIAGNOSIS — H9190 Unspecified hearing loss, unspecified ear: Secondary | ICD-10-CM | POA: Insufficient documentation

## 2019-06-19 DIAGNOSIS — Z9181 History of falling: Secondary | ICD-10-CM | POA: Insufficient documentation

## 2019-06-19 DIAGNOSIS — H25811 Combined forms of age-related cataract, right eye: Secondary | ICD-10-CM | POA: Diagnosis not present

## 2019-06-19 DIAGNOSIS — E039 Hypothyroidism, unspecified: Secondary | ICD-10-CM | POA: Diagnosis not present

## 2019-06-19 DIAGNOSIS — Z853 Personal history of malignant neoplasm of breast: Secondary | ICD-10-CM | POA: Insufficient documentation

## 2019-06-19 DIAGNOSIS — H401111 Primary open-angle glaucoma, right eye, mild stage: Secondary | ICD-10-CM | POA: Insufficient documentation

## 2019-06-19 DIAGNOSIS — I10 Essential (primary) hypertension: Secondary | ICD-10-CM | POA: Insufficient documentation

## 2019-06-19 DIAGNOSIS — Z96652 Presence of left artificial knee joint: Secondary | ICD-10-CM | POA: Diagnosis not present

## 2019-06-19 DIAGNOSIS — Z79890 Hormone replacement therapy (postmenopausal): Secondary | ICD-10-CM | POA: Diagnosis not present

## 2019-06-19 HISTORY — DX: Presence of spectacles and contact lenses: Z97.3

## 2019-06-19 HISTORY — PX: CATARACT EXTRACTION W/PHACO: SHX586

## 2019-06-19 SURGERY — PHACOEMULSIFICATION, CATARACT, WITH IOL INSERTION
Anesthesia: Monitor Anesthesia Care | Site: Eye | Laterality: Right

## 2019-06-19 MED ORDER — SODIUM HYALURONATE 10 MG/ML IO SOLN
INTRAOCULAR | Status: DC | PRN
  Administered 2019-06-19: 0.55 mL via INTRAOCULAR

## 2019-06-19 MED ORDER — ACETAMINOPHEN 325 MG PO TABS: 325.0000 mg | ORAL_TABLET | Freq: Once | ORAL | Status: DC

## 2019-06-19 MED ORDER — EPINEPHRINE PF 1 MG/ML IJ SOLN
INTRAOCULAR | Status: DC | PRN
  Administered 2019-06-19: 68 mL via OPHTHALMIC

## 2019-06-19 MED ORDER — ACETAMINOPHEN 160 MG/5ML PO SOLN: 325.0000 mg | Freq: Once | ORAL | Status: DC

## 2019-06-19 MED ORDER — MOXIFLOXACIN HCL 0.5 % OP SOLN
OPHTHALMIC | Status: DC | PRN
  Administered 2019-06-19: 0.2 mL via OPHTHALMIC

## 2019-06-19 MED ORDER — SODIUM HYALURONATE 23 MG/ML IO SOLN
INTRAOCULAR | Status: DC | PRN
  Administered 2019-06-19: 0.6 mL via INTRAOCULAR

## 2019-06-19 MED ORDER — FENTANYL CITRATE (PF) 100 MCG/2ML IJ SOLN
INTRAMUSCULAR | Status: DC | PRN
  Administered 2019-06-19: 50 ug via INTRAVENOUS

## 2019-06-19 MED ORDER — MIDAZOLAM HCL 2 MG/2ML IJ SOLN
INTRAMUSCULAR | Status: DC | PRN
  Administered 2019-06-19: 1 mg via INTRAVENOUS

## 2019-06-19 MED ORDER — ARMC OPHTHALMIC DILATING DROPS
1.0000 "application " | OPHTHALMIC | Status: DC | PRN
  Administered 2019-06-19 (×3): 1 via OPHTHALMIC

## 2019-06-19 MED ORDER — TETRACAINE HCL 0.5 % OP SOLN
1.0000 [drp] | OPHTHALMIC | Status: DC | PRN
  Administered 2019-06-19 (×3): 1 [drp] via OPHTHALMIC

## 2019-06-19 MED ORDER — LIDOCAINE HCL (PF) 2 % IJ SOLN
INTRAOCULAR | Status: DC | PRN
  Administered 2019-06-19: 1 mL via INTRAOCULAR

## 2019-06-19 SURGICAL SUPPLY — 21 items
CANNULA ANT/CHMB 27G (MISCELLANEOUS) ×2 IMPLANT
CANNULA ANT/CHMB 27GA (MISCELLANEOUS) ×6 IMPLANT
DEVICE INJECT ISTENT W (Stent) ×1 IMPLANT
DISSECTOR HYDRO NUCLEUS 50X22 (MISCELLANEOUS) ×3 IMPLANT
GLOVE SURG LX 7.5 STRW (GLOVE) ×2
GLOVE SURG LX STRL 7.5 STRW (GLOVE) ×1 IMPLANT
GLOVE SURG SYN 8.5  E (GLOVE) ×2
GLOVE SURG SYN 8.5 E (GLOVE) ×1 IMPLANT
GLOVE SURG SYN 8.5 PF PI (GLOVE) ×1 IMPLANT
GOWN STRL REUS W/ TWL LRG LVL3 (GOWN DISPOSABLE) ×2 IMPLANT
GOWN STRL REUS W/TWL LRG LVL3 (GOWN DISPOSABLE) ×4
INJECT ISTENT W (Stent) ×3 IMPLANT
LENS IOL TECNIS ITEC 22.0 (Intraocular Lens) ×2 IMPLANT
MARKER SKIN DUAL TIP RULER LAB (MISCELLANEOUS) ×3 IMPLANT
PACK DR. KING ARMS (PACKS) ×3 IMPLANT
PACK EYE AFTER SURG (MISCELLANEOUS) ×3 IMPLANT
PACK OPTHALMIC (MISCELLANEOUS) ×3 IMPLANT
SYR 3ML LL SCALE MARK (SYRINGE) ×3 IMPLANT
SYR TB 1ML LUER SLIP (SYRINGE) ×3 IMPLANT
WATER STERILE IRR 250ML POUR (IV SOLUTION) ×3 IMPLANT
WIPE NON LINTING 3.25X3.25 (MISCELLANEOUS) ×3 IMPLANT

## 2019-06-19 NOTE — Op Note (Signed)
OPERATIVE NOTE  Debra Patel [id number] 06/19/2019  PREOPERATIVE DIAGNOSIS:   1.  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  Nuclear sclerotic cataract right eye.  H25.11   POSTOPERATIVE DIAGNOSIS:    same.   PROCEDURE:   1.  Placement of trabecular bypass stent (istent). CPT 0191T  and placement of additional stent  CPT 0376T 2.  Phacoemusification with posterior chamber intraocular lens placement of the right eye  CPT [phone]   LENS: Implant Name Type Inv. Item Serial No. Manufacturer Lot No. LRB No. Used Action  INJECT ISTENT - [id number] Stent INJECT ISTENT [id number] [employer]  Right 1 Implanted  LENS IOL DIOP 22.0 - [id number] Intraocular Lens LENS IOL DIOP 22.0 [id number] [employer]O  Right 1 Implanted      Procedure(s) with comments: CATARACT EXTRACTION PHACO AND INTRAOCULAR LENS PLACEMENT (IOC) RIGHT ISTENT  00:56.3  9.0%  5.13 (Right) - Latex    SURGEON:  Benay Pillow, MD, MPH  ANESTHESIOLOGIST: Anesthesiologist: Ronelle Nigh, MD CRNA: Silvana Newness, CRNA; Cameron Ali, CRNA   ANESTHESIA:  MAC and intracameral preservative-free intracameral lidocaine 4%.  ESTIMATED BLOOD LOSS: less than 1 mL.   COMPLICATIONS:  None.   DESCRIPTION OF PROCEDURE:  The patient was identified in the holding room and transported to the operating room.   The patient was placed in the supine position under the operating microscope.  The right eye was prepped and draped in the usual sterile ophthalmic fashion.   A 1.0 millimeter clear-corneal paracentesis was made at the 10:30 position. 0.5 ml of preservative-free 1% lidocaine with epinephrine was injected into the anterior chamber.  The anterior chamber was filled with Healon 5 viscoelastic.  A 2.4 millimeter keratome was used to make a near-clear corneal incision at the 8:00 position.   Attention was turned to the istent.  The patients head was turned to the left and the microscope was tilted to 035 degrees.  Ocular  instruments/Gl[employer] OAL/H2 gonioprism was used with IPC05 (iclip) coupled with Healon 5 on the cornea was used to visualize the trabecular meshwork. The istent was opened and introduced into the eye.  The meshwork was engaged with the tip of the iStent injector and the stent was deployed into Schlemm's canal at 2:00.  The second stent was deployed at 4:00.  The stents were well seated and in good position.  Next, attention was turned to the phacoemulsification A curvilinear capsulorrhexis was made with a cystotome and capsulorrhexis forceps.  Balanced salt solution was used to hydrodissect and hydrodelineate the nucleus.   Phacoemulsification was then used in stop and chop fashion to remove the lens nucleus and epinucleus.  The remaining cortex was then removed using the irrigation and aspiration handpiece. Healon was then placed into the capsular bag to distend it for lens placement.  A lens was then injected into the capsular bag.  The remaining viscoelastic was aspirated.   Wounds were hydrated with balanced salt solution.  The anterior chamber was inflated to a physiologic pressure with balanced salt solution.   Intracameral vigamox 0.1 mL undiluted was injected into the eye and a drop placed onto the ocular surface.  No wound leaks were noted. The patient was taken to the recovery room in stable condition without complications of anesthesia or surgery   Benay Pillow 06/19/2019, 1:53 PM

## 2019-06-19 NOTE — Anesthesia Preprocedure Evaluation (Signed)
Anesthesia Evaluation  Patient identified by MRN, date of birth, ID band Patient awake    Reviewed: Allergy & Precautions, H&P , NPO status , Patient's Chart, lab work & pertinent test results  Airway Mallampati: II  TM Distance: >3 FB Neck ROM: full    Dental no notable dental hx.    Pulmonary    Pulmonary exam normal breath sounds clear to auscultation       Cardiovascular hypertension, Normal cardiovascular exam Rhythm:regular Rate:Normal     Neuro/Psych    GI/Hepatic GERD  ,  Endo/Other  Hypothyroidism   Renal/GU      Musculoskeletal   Abdominal   Peds  Hematology   Anesthesia Other Findings   Reproductive/Obstetrics                             Anesthesia Physical Anesthesia Plan  ASA: II  Anesthesia Plan: MAC   Post-op Pain Management:    Induction:   PONV Risk Score and Plan: 2 and Midazolam, Treatment may vary due to age or medical condition and TIVA  Airway Management Planned:   Additional Equipment:   Intra-op Plan:   Post-operative Plan:   Informed Consent: I have reviewed the patients History and Physical, chart, labs and discussed the procedure including the risks, benefits and alternatives for the proposed anesthesia with the patient or authorized representative who has indicated his/her understanding and acceptance.       Plan Discussed with: CRNA  Anesthesia Plan Comments:         Anesthesia Quick Evaluation

## 2019-06-19 NOTE — H&P (Signed)

## 2019-06-19 NOTE — Anesthesia Postprocedure Evaluation (Signed)
Anesthesia Post Note  Patient: Debra Patel  Procedure(s) Performed: CATARACT EXTRACTION PHACO AND INTRAOCULAR LENS PLACEMENT (IOC) RIGHT ISTENT  00:56.3  9.0%  5.13 (Right Eye)  Patient location during evaluation: PACU Anesthesia Type: MAC Level of consciousness: awake and alert and oriented Pain management: satisfactory to patient Vital Signs Assessment: post-procedure vital signs reviewed and stable Respiratory status: spontaneous breathing, nonlabored ventilation and respiratory function stable Cardiovascular status: blood pressure returned to baseline and stable Postop Assessment: Adequate PO intake and No signs of nausea or vomiting Anesthetic complications: no    Raliegh Ip

## 2019-06-19 NOTE — Transfer of Care (Signed)
Immediate Anesthesia Transfer of Care Note  Patient: Debra Patel  Procedure(s) Performed: CATARACT EXTRACTION PHACO AND INTRAOCULAR LENS PLACEMENT (IOC) RIGHT ISTENT  00:56.3  9.0%  5.13 (Right Eye)  Patient Location: PACU  Anesthesia Type: MAC  Level of Consciousness: awake, alert  and patient cooperative  Airway and Oxygen Therapy: Patient Spontanous Breathing and Patient connected to supplemental oxygen  Post-op Assessment: Post-op Vital signs reviewed, Patient's Cardiovascular Status Stable, Respiratory Function Stable, Patent Airway and No signs of Nausea or vomiting  Post-op Vital Signs: Reviewed and stable  Complications: No apparent anesthesia complications

## 2019-06-27 DIAGNOSIS — L570 Actinic keratosis: Secondary | ICD-10-CM | POA: Diagnosis not present

## 2019-06-27 DIAGNOSIS — L218 Other seborrheic dermatitis: Secondary | ICD-10-CM | POA: Diagnosis not present

## 2019-06-27 DIAGNOSIS — D485 Neoplasm of uncertain behavior of skin: Secondary | ICD-10-CM | POA: Diagnosis not present

## 2019-06-27 DIAGNOSIS — C44311 Basal cell carcinoma of skin of nose: Secondary | ICD-10-CM | POA: Diagnosis not present

## 2019-07-09 ENCOUNTER — Encounter: Payer: Self-pay | Admitting: Orthopedic Surgery

## 2019-07-17 DIAGNOSIS — H2512 Age-related nuclear cataract, left eye: Secondary | ICD-10-CM | POA: Diagnosis not present

## 2019-07-19 DIAGNOSIS — C44311 Basal cell carcinoma of skin of nose: Secondary | ICD-10-CM | POA: Diagnosis not present

## 2019-07-27 ENCOUNTER — Other Ambulatory Visit: Admission: RE | Admit: 2019-07-27 | Payer: PPO | Source: Ambulatory Visit

## 2019-07-27 NOTE — Discharge Instructions (Signed)

## 2019-07-28 ENCOUNTER — Other Ambulatory Visit: Payer: Self-pay

## 2019-07-28 ENCOUNTER — Other Ambulatory Visit
Admission: RE | Admit: 2019-07-28 | Discharge: 2019-07-28 | Disposition: A | Discharge status: Home / Self Care | Payer: PPO | Source: Ambulatory Visit | Attending: Ophthalmology | Admitting: Ophthalmology

## 2019-07-28 DIAGNOSIS — Z20828 Contact with and (suspected) exposure to other viral communicable diseases: Secondary | ICD-10-CM | POA: Insufficient documentation

## 2019-07-28 DIAGNOSIS — Z01812 Encounter for preprocedural laboratory examination: Secondary | ICD-10-CM | POA: Diagnosis not present

## 2019-07-28 LAB — SARS CORONAVIRUS 2 (TAT 6-24 HRS): SARS Coronavirus 2: NEGATIVE

## 2019-07-31 ENCOUNTER — Ambulatory Visit
Admission: RE | Admit: 2019-07-31 | Discharge: 2019-07-31 | Disposition: A | Discharge status: Home / Self Care | Payer: PPO | Attending: Ophthalmology | Admitting: Ophthalmology

## 2019-07-31 ENCOUNTER — Ambulatory Visit: Payer: PPO | Admitting: Anesthesiology

## 2019-07-31 ENCOUNTER — Encounter
Admission: RE | Disposition: A | Discharge status: Home / Self Care | Payer: Self-pay | Source: Home / Self Care | Attending: Ophthalmology

## 2019-07-31 ENCOUNTER — Other Ambulatory Visit: Payer: Self-pay

## 2019-07-31 DIAGNOSIS — E039 Hypothyroidism, unspecified: Secondary | ICD-10-CM | POA: Diagnosis not present

## 2019-07-31 DIAGNOSIS — Z9849 Cataract extraction status, unspecified eye: Secondary | ICD-10-CM | POA: Diagnosis not present

## 2019-07-31 DIAGNOSIS — Z85828 Personal history of other malignant neoplasm of skin: Secondary | ICD-10-CM | POA: Insufficient documentation

## 2019-07-31 DIAGNOSIS — H2512 Age-related nuclear cataract, left eye: Secondary | ICD-10-CM | POA: Insufficient documentation

## 2019-07-31 DIAGNOSIS — Z91040 Latex allergy status: Secondary | ICD-10-CM | POA: Diagnosis not present

## 2019-07-31 DIAGNOSIS — Z96652 Presence of left artificial knee joint: Secondary | ICD-10-CM | POA: Insufficient documentation

## 2019-07-31 DIAGNOSIS — I10 Essential (primary) hypertension: Secondary | ICD-10-CM | POA: Diagnosis not present

## 2019-07-31 DIAGNOSIS — H401121 Primary open-angle glaucoma, left eye, mild stage: Secondary | ICD-10-CM | POA: Diagnosis not present

## 2019-07-31 DIAGNOSIS — Z79890 Hormone replacement therapy (postmenopausal): Secondary | ICD-10-CM | POA: Diagnosis not present

## 2019-07-31 DIAGNOSIS — H25812 Combined forms of age-related cataract, left eye: Secondary | ICD-10-CM | POA: Diagnosis not present

## 2019-07-31 HISTORY — PX: CATARACT EXTRACTION W/PHACO: SHX586

## 2019-07-31 SURGERY — PHACOEMULSIFICATION, CATARACT, WITH IOL INSERTION
Anesthesia: Monitor Anesthesia Care | Site: Eye | Laterality: Left

## 2019-07-31 MED ORDER — LIDOCAINE HCL (PF) 2 % IJ SOLN
INTRAOCULAR | Status: DC | PRN
  Administered 2019-07-31: 1 mL via INTRAOCULAR

## 2019-07-31 MED ORDER — SODIUM HYALURONATE 10 MG/ML IO SOLN
INTRAOCULAR | Status: DC | PRN
  Administered 2019-07-31: 0.55 mL via INTRAOCULAR

## 2019-07-31 MED ORDER — ONDANSETRON HCL 4 MG/2ML IJ SOLN: 4.0000 mg | Freq: Once | INTRAMUSCULAR | Status: DC | PRN

## 2019-07-31 MED ORDER — FENTANYL CITRATE (PF) 100 MCG/2ML IJ SOLN
INTRAMUSCULAR | Status: DC | PRN
  Administered 2019-07-31: 50 ug via INTRAVENOUS

## 2019-07-31 MED ORDER — MOXIFLOXACIN HCL 0.5 % OP SOLN
OPHTHALMIC | Status: DC | PRN
  Administered 2019-07-31: 0.2 mL via OPHTHALMIC

## 2019-07-31 MED ORDER — LACTATED RINGERS IV SOLN: INTRAVENOUS | Status: DC

## 2019-07-31 MED ORDER — ARMC OPHTHALMIC DILATING DROPS
1.0000 "application " | OPHTHALMIC | Status: DC | PRN
  Administered 2019-07-31 (×3): 1 via OPHTHALMIC

## 2019-07-31 MED ORDER — SODIUM HYALURONATE 23 MG/ML IO SOLN
INTRAOCULAR | Status: DC | PRN
  Administered 2019-07-31: 0.6 mL via INTRAOCULAR

## 2019-07-31 MED ORDER — TETRACAINE HCL 0.5 % OP SOLN
1.0000 [drp] | OPHTHALMIC | Status: DC | PRN
  Administered 2019-07-31 (×2): 1 [drp] via OPHTHALMIC

## 2019-07-31 MED ORDER — MIDAZOLAM HCL 2 MG/2ML IJ SOLN
INTRAMUSCULAR | Status: DC | PRN
  Administered 2019-07-31: 1 mg via INTRAVENOUS

## 2019-07-31 MED ORDER — EPINEPHRINE PF 1 MG/ML IJ SOLN
INTRAOCULAR | Status: DC | PRN
  Administered 2019-07-31: 75 mL via OPHTHALMIC

## 2019-07-31 SURGICAL SUPPLY — 21 items
CANNULA ANT/CHMB 27G (MISCELLANEOUS) ×2 IMPLANT
CANNULA ANT/CHMB 27GA (MISCELLANEOUS) ×6 IMPLANT
DEVICE INJECT ISTENT W (Stent) ×1 IMPLANT
DISSECTOR HYDRO NUCLEUS 50X22 (MISCELLANEOUS) ×3 IMPLANT
GLOVE SURG LX 7.5 STRW (GLOVE) ×2
GLOVE SURG LX STRL 7.5 STRW (GLOVE) ×1 IMPLANT
GLOVE SURG SYN 8.5  E (GLOVE) ×2
GLOVE SURG SYN 8.5 E (GLOVE) ×1 IMPLANT
GLOVE SURG SYN 8.5 PF PI (GLOVE) ×1 IMPLANT
GOWN STRL REUS W/ TWL LRG LVL3 (GOWN DISPOSABLE) ×2 IMPLANT
GOWN STRL REUS W/TWL LRG LVL3 (GOWN DISPOSABLE) ×4
INJECT ISTENT W (Stent) ×3 IMPLANT
LENS IOL TECNIS ITEC 22.5 (Intraocular Lens) ×2 IMPLANT
MARKER SKIN DUAL TIP RULER LAB (MISCELLANEOUS) ×3 IMPLANT
PACK DR. KING ARMS (PACKS) ×3 IMPLANT
PACK EYE AFTER SURG (MISCELLANEOUS) ×3 IMPLANT
PACK OPTHALMIC (MISCELLANEOUS) ×3 IMPLANT
SYR 3ML LL SCALE MARK (SYRINGE) ×3 IMPLANT
SYR TB 1ML LUER SLIP (SYRINGE) ×3 IMPLANT
WATER STERILE IRR 250ML POUR (IV SOLUTION) ×3 IMPLANT
WIPE NON LINTING 3.25X3.25 (MISCELLANEOUS) ×3 IMPLANT

## 2019-07-31 NOTE — Anesthesia Postprocedure Evaluation (Signed)
Anesthesia Post Note  Patient: Debra Patel  Procedure(s) Performed: CATARACT EXTRACTION PHACO AND INTRAOCULAR LENS PLACEMENT (IOC) LEFT ISTENT INJ 00:48.1  12.9%  6.34 (Left Eye)     Patient location during evaluation: PACU Anesthesia Type: MAC Level of consciousness: awake and alert Pain management: pain level controlled Vital Signs Assessment: post-procedure vital signs reviewed and stable Respiratory status: spontaneous breathing, nonlabored ventilation, respiratory function stable and patient connected to nasal cannula oxygen Cardiovascular status: stable and blood pressure returned to baseline Postop Assessment: no apparent nausea or vomiting Anesthetic complications: no    Adele Barthel Brianda Beitler

## 2019-07-31 NOTE — H&P (Signed)

## 2019-07-31 NOTE — Transfer of Care (Signed)
Immediate Anesthesia Transfer of Care Note  Patient: Debra Patel  Procedure(s) Performed: CATARACT EXTRACTION PHACO AND INTRAOCULAR LENS PLACEMENT (IOC) LEFT ISTENT INJ (Left Eye)  Patient Location: PACU  Anesthesia Type: MAC  Level of Consciousness: awake, alert  and patient cooperative  Airway and Oxygen Therapy: Patient Spontanous Breathing and Patient connected to supplemental oxygen  Post-op Assessment: Post-op Vital signs reviewed, Patient's Cardiovascular Status Stable, Respiratory Function Stable, Patent Airway and No signs of Nausea or vomiting  Post-op Vital Signs: Reviewed and stable  Complications: No apparent anesthesia complications

## 2019-07-31 NOTE — Op Note (Signed)
OPERATIVE NOTE  TZIPORA MCINROY [id number] 07/31/2019  PREOPERATIVE DIAGNOSIS:   1.  Mild  PRIMARY open angle glaucoma, left eye. Y56.3893  2.  Nuclear sclerotic cataract left eye.  H25.12   POSTOPERATIVE DIAGNOSIS:    same.   PROCEDURE:   1.  Placement of trabecular bypass stent (istent). CPT 0191T  and placement of additional stent  CPT 0376T 2.  Phacoemusification with posterior chamber intraocular lens placement of the right eye  CPT [phone]   LENS: Implant Name Type Inv. Item Serial No. Manufacturer Lot No. LRB No. Used Action  INJECT ISTENT - [id number] Stent INJECT ISTENT [id number] [employer]  Left 1 Implanted  LENS IOL DIOP 22.5 - [id number] Intraocular Lens LENS IOL DIOP 22.5 [id number] AMO  Left 1 Implanted      Procedure(s): CATARACT EXTRACTION PHACO AND INTRAOCULAR LENS PLACEMENT (IOC) LEFT ISTENT INJ 00:48.1  12.9%  6.34 (Left)    SURGEON:  Benay Pillow, MD, MPH  ANESTHESIOLOGIST: Anesthesiologist: Page, Adele Barthel, MD CRNA: Cameron Ali, CRNA   ANESTHESIA:  MAC and intracameral preservative-free intracameral lidocaine 4%.  ESTIMATED BLOOD LOSS: less than 1 mL.   COMPLICATIONS:  None.   DESCRIPTION OF PROCEDURE:  The patient was identified in the holding room and transported to the operating room.   The patient was placed in the supine position under the operating microscope.   The left eye was prepped and draped in the usual sterile ophthalmic fashion.   A 1.0 millimeter clear-corneal paracentesis was made at the 4:30 position. 0.5 ml of preservative-free 1% lidocaine with epinephrine was injected into the anterior chamber.  The anterior chamber was filled with Healon 5 viscoelastic.  A 2.4 millimeter keratome was used to make a near-clear corneal incision at the 2:00 position.   Attention was turned to the istent.  The patients head was turned to the left and the microscope was tilted to 035 degrees.  Ocular instruments/Gl[employer] OAL/H2 gonioprism  was used with IPC05 (iclip) coupled with Healon 5 on the cornea was used to visualize the trabecular meshwork. The istent was opened and introduced into the eye.  The meshwork was engaged with the tip of the iStent injector and the stent was deployed into Schlemm's canal at 10:30.  The second stent was deployed at 8:00.  The stents were well seated and in good position.  Next, attention was turned to the phacoemulsification A curvilinear capsulorrhexis was made with a cystotome and capsulorrhexis forceps.  Balanced salt solution was used to hydrodissect and hydrodelineate the nucleus.   Phacoemulsification was then used in stop and chop fashion to remove the lens nucleus and epinucleus.  The remaining cortex was then removed using the irrigation and aspiration handpiece. Healon was then placed into the capsular bag to distend it for lens placement.  A lens was then injected into the capsular bag.  The remaining viscoelastic was aspirated.   Wounds were hydrated with balanced salt solution.  The anterior chamber was inflated to a physiologic pressure with balanced salt solution.   Intracameral vigamox 0.1 mL undiluted was injected into the eye and a drop placed onto the ocular surface.  No wound leaks were noted. The patient was taken to the recovery room in stable condition without complications of anesthesia or surgery   Benay Pillow 07/31/2019, 12:35 PM

## 2019-07-31 NOTE — Anesthesia Procedure Notes (Signed)
Procedure Name: MAC Date/Time: 07/31/2019 12:09 PM Performed by: Cameron Ali, CRNA Pre-anesthesia Checklist: Patient identified, Emergency Drugs available, Suction available, Timeout performed and Patient being monitored Patient Re-evaluated:Patient Re-evaluated prior to induction Oxygen Delivery Method: Nasal cannula Placement Confirmation: positive ETCO2

## 2019-07-31 NOTE — Anesthesia Preprocedure Evaluation (Addendum)
Anesthesia Evaluation  Patient identified by MRN, date of birth, ID band Patient awake    History of Anesthesia Complications Negative for: history of anesthetic complications  Airway Mallampati: II  TM Distance: >3 FB Neck ROM: Full    Dental no notable dental hx.    Pulmonary neg pulmonary ROS,    Pulmonary exam normal        Cardiovascular hypertension, Normal cardiovascular exam     Neuro/Psych    GI/Hepatic negative GI ROS, Neg liver ROS,   Endo/Other  Hypothyroidism   Renal/GU negative Renal ROS     Musculoskeletal negative musculoskeletal ROS (+)   Abdominal   Peds  Hematology   Anesthesia Other Findings   Reproductive/Obstetrics                            Anesthesia Physical Anesthesia Plan  ASA: II  Anesthesia Plan: MAC   Post-op Pain Management:    Induction: Intravenous  PONV Risk Score and Plan: 2 and Midazolam and TIVA  Airway Management Planned: Nasal Cannula and Natural Airway  Additional Equipment:   Intra-op Plan:   Post-operative Plan:   Informed Consent: I have reviewed the patients History and Physical, chart, labs and discussed the procedure including the risks, benefits and alternatives for the proposed anesthesia with the patient or authorized representative who has indicated his/her understanding and acceptance.       Plan Discussed with: CRNA  Anesthesia Plan Comments:         Anesthesia Quick Evaluation

## 2019-08-01 ENCOUNTER — Encounter: Payer: Self-pay | Admitting: Ophthalmology

## 2019-08-10 DIAGNOSIS — Z85828 Personal history of other malignant neoplasm of skin: Secondary | ICD-10-CM | POA: Diagnosis not present

## 2019-08-10 DIAGNOSIS — L570 Actinic keratosis: Secondary | ICD-10-CM | POA: Diagnosis not present

## 2019-08-10 DIAGNOSIS — C44729 Squamous cell carcinoma of skin of left lower limb, including hip: Secondary | ICD-10-CM | POA: Diagnosis not present

## 2019-08-10 DIAGNOSIS — D485 Neoplasm of uncertain behavior of skin: Secondary | ICD-10-CM | POA: Diagnosis not present

## 2019-08-10 DIAGNOSIS — Z872 Personal history of diseases of the skin and subcutaneous tissue: Secondary | ICD-10-CM | POA: Diagnosis not present

## 2019-08-10 DIAGNOSIS — Z859 Personal history of malignant neoplasm, unspecified: Secondary | ICD-10-CM | POA: Diagnosis not present

## 2019-08-10 DIAGNOSIS — L578 Other skin changes due to chronic exposure to nonionizing radiation: Secondary | ICD-10-CM | POA: Diagnosis not present

## 2019-08-10 DIAGNOSIS — L281 Prurigo nodularis: Secondary | ICD-10-CM | POA: Diagnosis not present

## 2019-09-05 DIAGNOSIS — C44729 Squamous cell carcinoma of skin of left lower limb, including hip: Secondary | ICD-10-CM | POA: Diagnosis not present

## 2019-10-10 DIAGNOSIS — L300 Nummular dermatitis: Secondary | ICD-10-CM | POA: Diagnosis not present

## 2019-12-19 DIAGNOSIS — Z7689 Persons encountering health services in other specified circumstances: Secondary | ICD-10-CM | POA: Diagnosis not present

## 2019-12-19 DIAGNOSIS — S72002S Fracture of unspecified part of neck of left femur, sequela: Secondary | ICD-10-CM | POA: Diagnosis not present

## 2019-12-19 DIAGNOSIS — M75121 Complete rotator cuff tear or rupture of right shoulder, not specified as traumatic: Secondary | ICD-10-CM | POA: Diagnosis not present

## 2019-12-19 DIAGNOSIS — M810 Age-related osteoporosis without current pathological fracture: Secondary | ICD-10-CM | POA: Diagnosis not present

## 2019-12-19 DIAGNOSIS — Z79899 Other long term (current) drug therapy: Secondary | ICD-10-CM | POA: Diagnosis not present

## 2019-12-19 DIAGNOSIS — Z131 Encounter for screening for diabetes mellitus: Secondary | ICD-10-CM | POA: Diagnosis not present

## 2019-12-19 DIAGNOSIS — C50912 Malignant neoplasm of unspecified site of left female breast: Secondary | ICD-10-CM | POA: Diagnosis not present

## 2019-12-19 DIAGNOSIS — Z1321 Encounter for screening for nutritional disorder: Secondary | ICD-10-CM | POA: Diagnosis not present

## 2019-12-19 DIAGNOSIS — I10 Essential (primary) hypertension: Secondary | ICD-10-CM | POA: Diagnosis not present

## 2019-12-19 DIAGNOSIS — I493 Ventricular premature depolarization: Secondary | ICD-10-CM | POA: Diagnosis not present

## 2019-12-19 DIAGNOSIS — E039 Hypothyroidism, unspecified: Secondary | ICD-10-CM | POA: Diagnosis not present

## 2019-12-19 DIAGNOSIS — Z13220 Encounter for screening for lipoid disorders: Secondary | ICD-10-CM | POA: Diagnosis not present

## 2019-12-19 DIAGNOSIS — Z23 Encounter for immunization: Secondary | ICD-10-CM | POA: Diagnosis not present

## 2019-12-19 DIAGNOSIS — N200 Calculus of kidney: Secondary | ICD-10-CM | POA: Diagnosis not present

## 2019-12-19 DIAGNOSIS — K219 Gastro-esophageal reflux disease without esophagitis: Secondary | ICD-10-CM | POA: Diagnosis not present

## 2019-12-19 DIAGNOSIS — K5909 Other constipation: Secondary | ICD-10-CM | POA: Diagnosis not present

## 2019-12-19 DIAGNOSIS — F419 Anxiety disorder, unspecified: Secondary | ICD-10-CM | POA: Diagnosis not present

## 2019-12-19 DIAGNOSIS — Z7189 Other specified counseling: Secondary | ICD-10-CM | POA: Diagnosis not present

## 2019-12-19 DIAGNOSIS — R413 Other amnesia: Secondary | ICD-10-CM | POA: Diagnosis not present

## 2019-12-19 DIAGNOSIS — R55 Syncope and collapse: Secondary | ICD-10-CM | POA: Diagnosis not present

## 2019-12-19 DIAGNOSIS — Z91018 Allergy to other foods: Secondary | ICD-10-CM | POA: Diagnosis not present

## 2019-12-19 DIAGNOSIS — Z0000 Encounter for general adult medical examination without abnormal findings: Secondary | ICD-10-CM | POA: Diagnosis not present

## 2020-01-29 DIAGNOSIS — M25562 Pain in left knee: Secondary | ICD-10-CM | POA: Diagnosis not present

## 2020-01-29 DIAGNOSIS — Z96652 Presence of left artificial knee joint: Secondary | ICD-10-CM | POA: Diagnosis not present

## 2020-01-29 DIAGNOSIS — M7050 Other bursitis of knee, unspecified knee: Secondary | ICD-10-CM | POA: Diagnosis not present

## 2020-04-09 DIAGNOSIS — D485 Neoplasm of uncertain behavior of skin: Secondary | ICD-10-CM | POA: Diagnosis not present

## 2020-04-09 DIAGNOSIS — L578 Other skin changes due to chronic exposure to nonionizing radiation: Secondary | ICD-10-CM | POA: Diagnosis not present

## 2020-04-09 DIAGNOSIS — C44311 Basal cell carcinoma of skin of nose: Secondary | ICD-10-CM | POA: Diagnosis not present

## 2020-04-09 DIAGNOSIS — Z85828 Personal history of other malignant neoplasm of skin: Secondary | ICD-10-CM | POA: Diagnosis not present

## 2020-04-09 DIAGNOSIS — L281 Prurigo nodularis: Secondary | ICD-10-CM | POA: Diagnosis not present

## 2020-04-09 DIAGNOSIS — L570 Actinic keratosis: Secondary | ICD-10-CM | POA: Diagnosis not present

## 2020-04-09 DIAGNOSIS — Z859 Personal history of malignant neoplasm, unspecified: Secondary | ICD-10-CM | POA: Diagnosis not present

## 2020-04-09 DIAGNOSIS — Z872 Personal history of diseases of the skin and subcutaneous tissue: Secondary | ICD-10-CM | POA: Diagnosis not present

## 2020-04-22 DIAGNOSIS — C44311 Basal cell carcinoma of skin of nose: Secondary | ICD-10-CM | POA: Diagnosis not present

## 2020-04-26 IMAGING — CT CT HEAD W/O CM
3 series · 15 of 45 positions shown, 18 images · non-contrast
Comparison: 03/04/2015

CLINICAL DATA: Dizziness with resultant fall.

EXAM:
CT HEAD WITHOUT CONTRAST
TECHNIQUE: Contiguous axial images were obtained from the base of the skull
through the vertex without intravenous contrast.

[Series 2: head wo · axial · 0.43mm/px · z∈[+726,+841]mm · 9 of 28 slices shown, 12 images]
[im 3/28  brain]
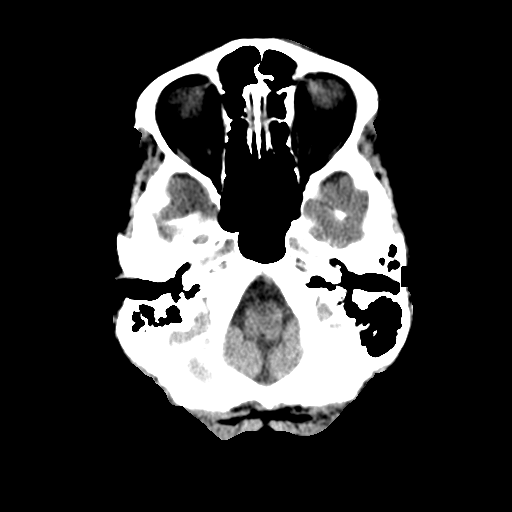
[im 3/28  bone]
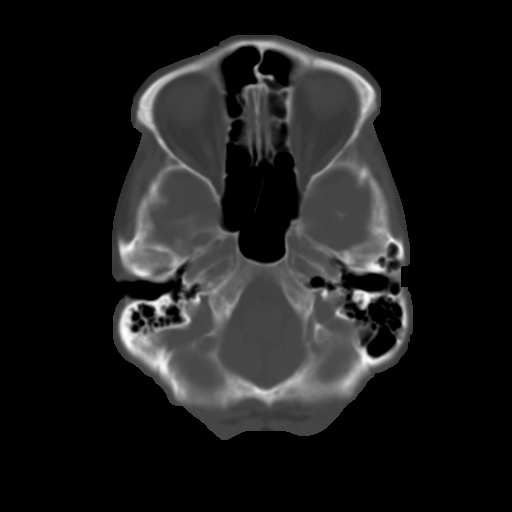
[im 6/28  brain]
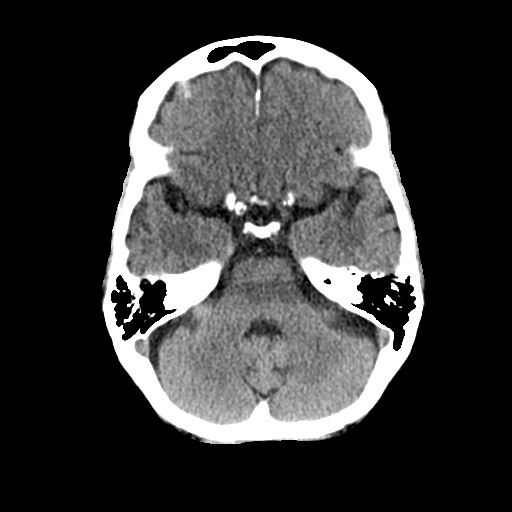
[im 9/28  brain]
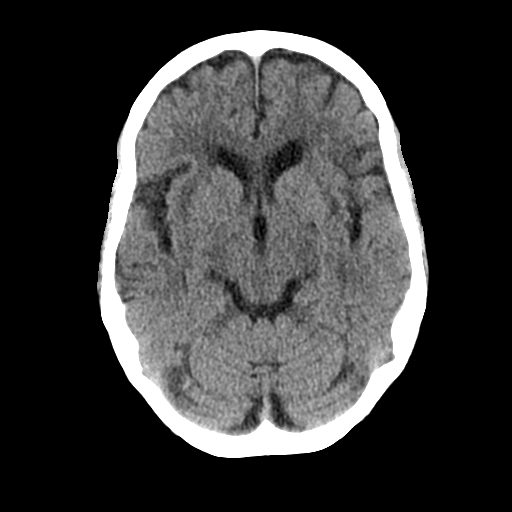
[im 12/28  brain]
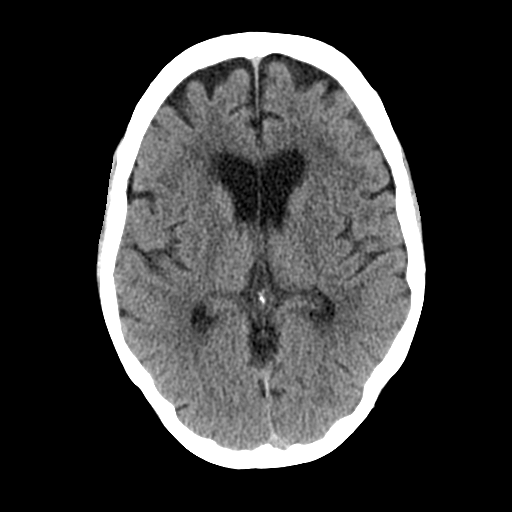
[im 15/28  brain]
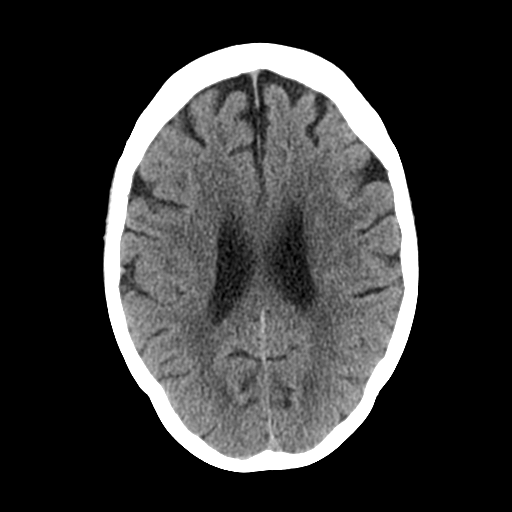
[im 15/28  bone]
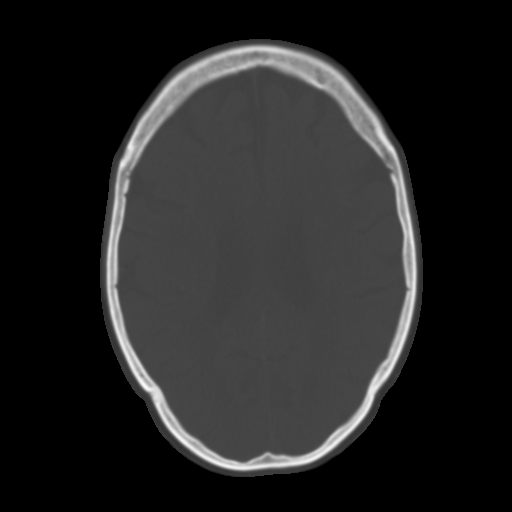
[im 17/28  brain]
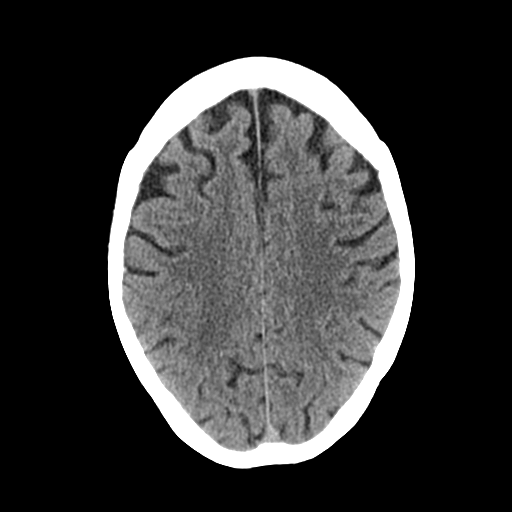
[im 20/28  brain]
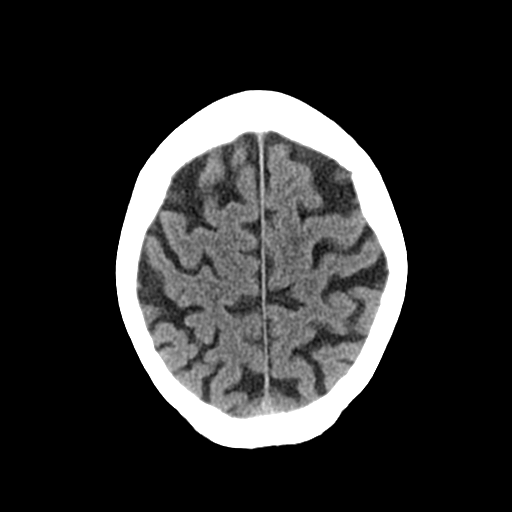
[im 23/28  brain]
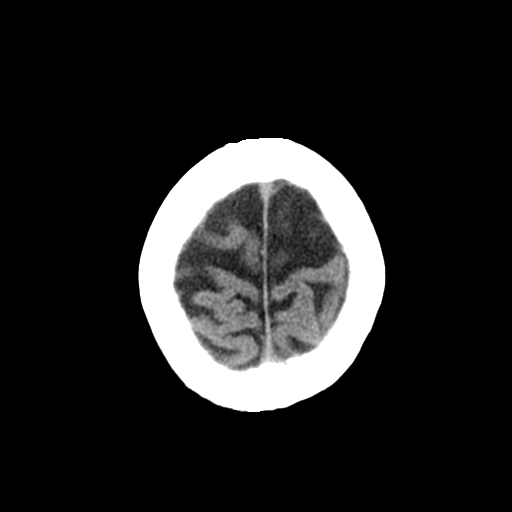
[im 26/28  brain]
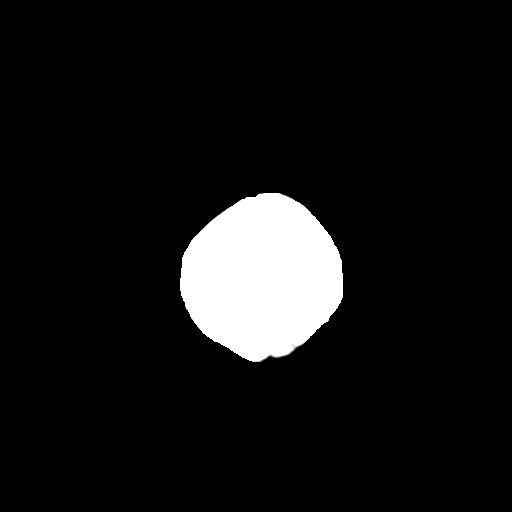
[im 26/28  bone]
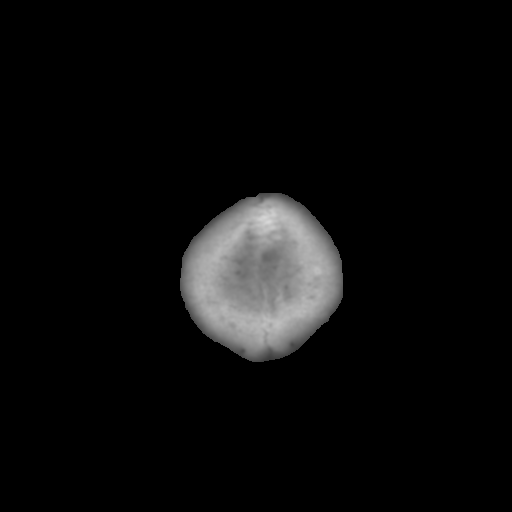

[Series 4: coronal soft tissue · coronal · 0.27mm/px · 3 of 64 slices shown]
[im 22/64  brain]
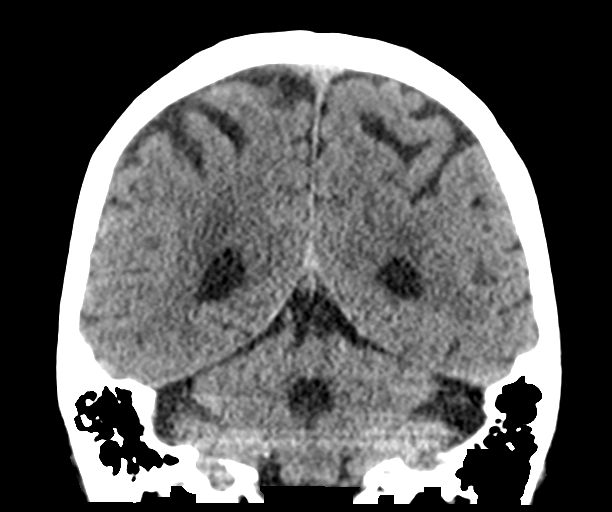
[im 29/64  brain]
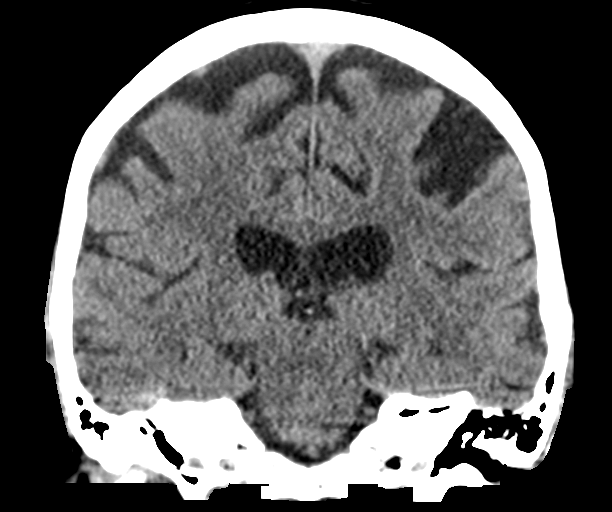
[im 36/64  brain]
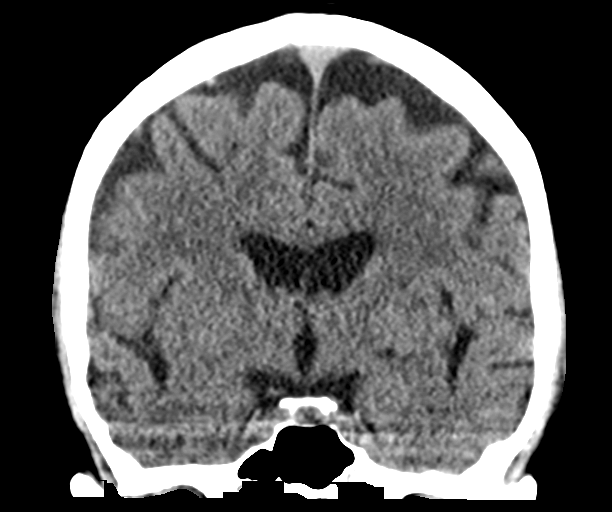

[Series 5: sagittal soft tissue · sagittal · 0.28mm/px · 3 of 46 slices shown]
[im 16/46  brain]
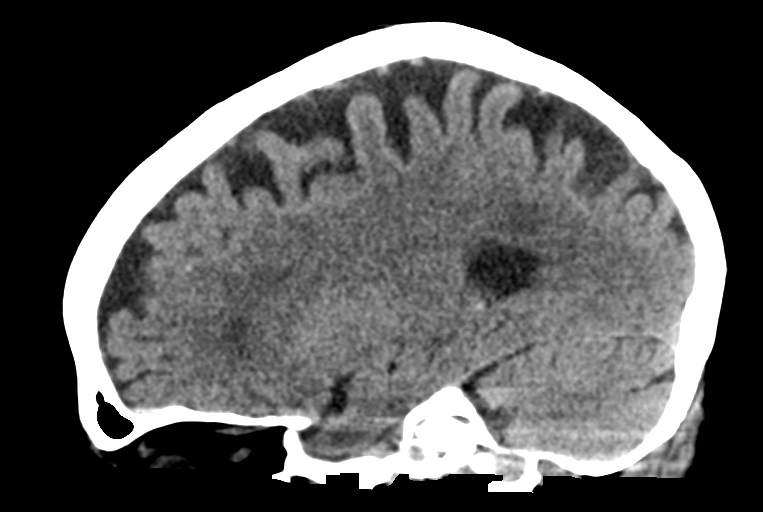
[im 23/46  brain]
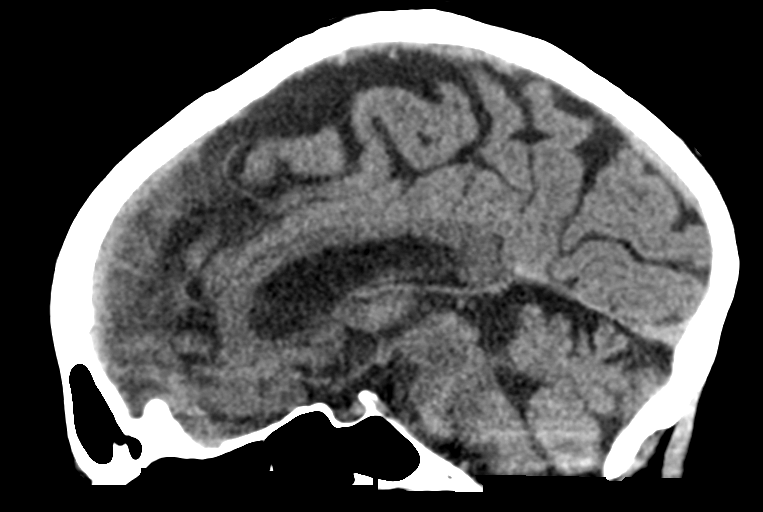
[im 31/46  brain]
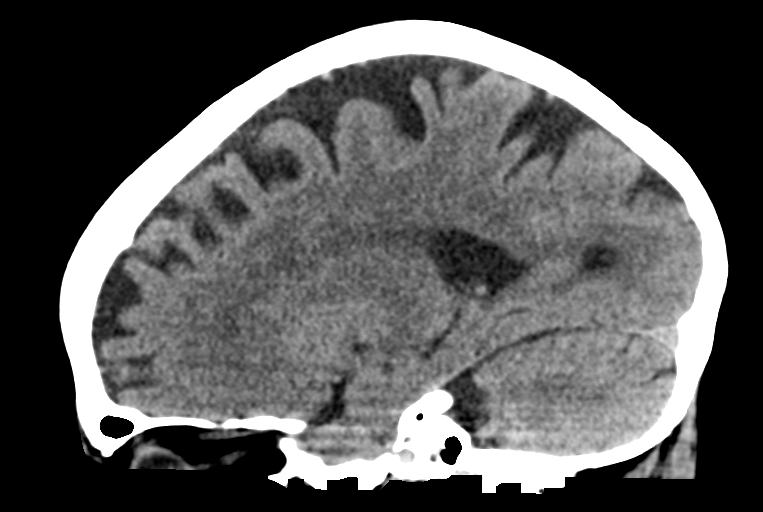

[15 of 45 positions shown; findings below may reference images not displayed]

FINDINGS: Brain: Stable mild superficial and central atrophy with chronic
minimal small vessel ischemic disease of periventricular white
matter. No intra-axial mass nor extra-axial fluid collections.
Midline fourth ventricle and basal cisterns without effacement.
Brainstem and cerebellum are unremarkable. No acute intracranial
hemorrhage or large vascular territory infarct is identified.

Vascular: No hyperdense vessels. Atherosclerosis of the carotid
siphons.

Skull: Negative for fracture or suspicious osseous lesions.

Sinuses/Orbits: No acute finding.

Other: None.
IMPRESSION: Stable mild atrophy and chronic small vessel ischemia. No acute
intracranial abnormality.

## 2020-04-26 IMAGING — CR DG CHEST 1V
1 series · 2 of 2 positions shown · non-contrast
Comparison: 03/04/2015

CLINICAL DATA: Left hip pain after a fall today. History of breast
cancer.

EXAM:
CHEST  1 VIEW

[Series 2: chest ap · 0.14mm/px · 2 of 2 slices shown]
[im 1/2]
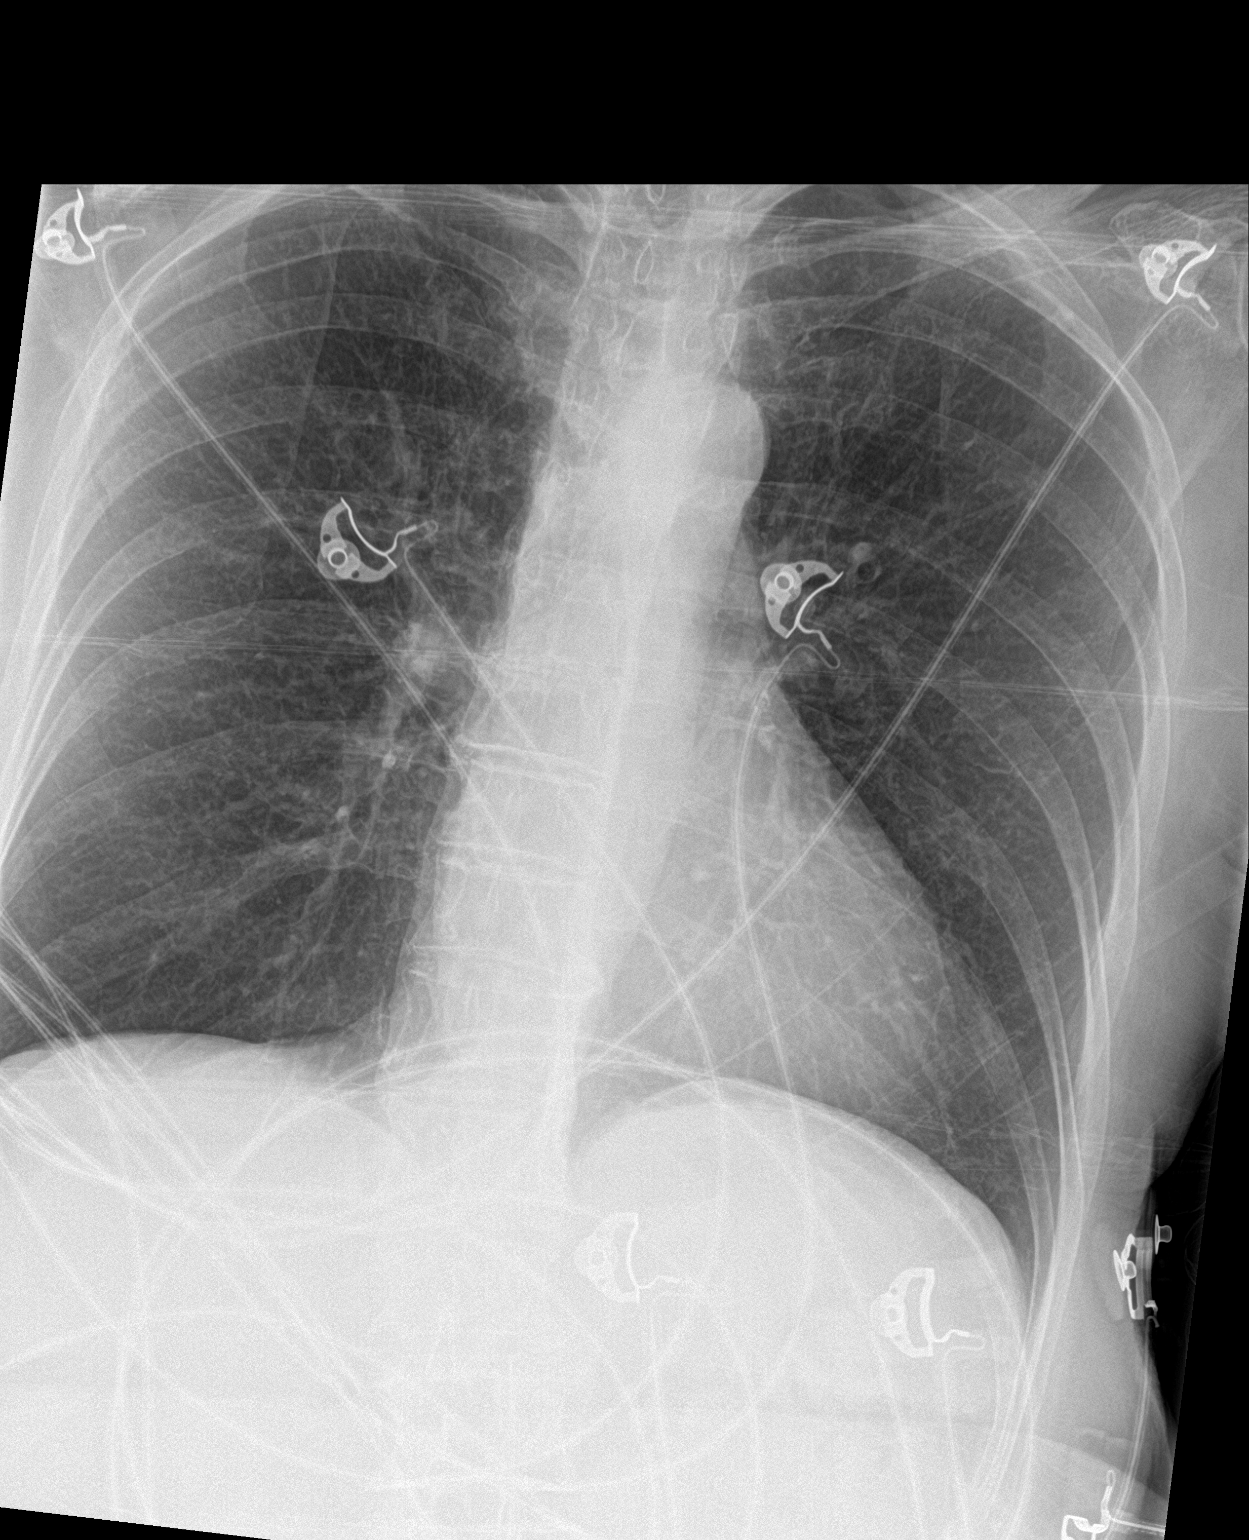
[im 2/2]
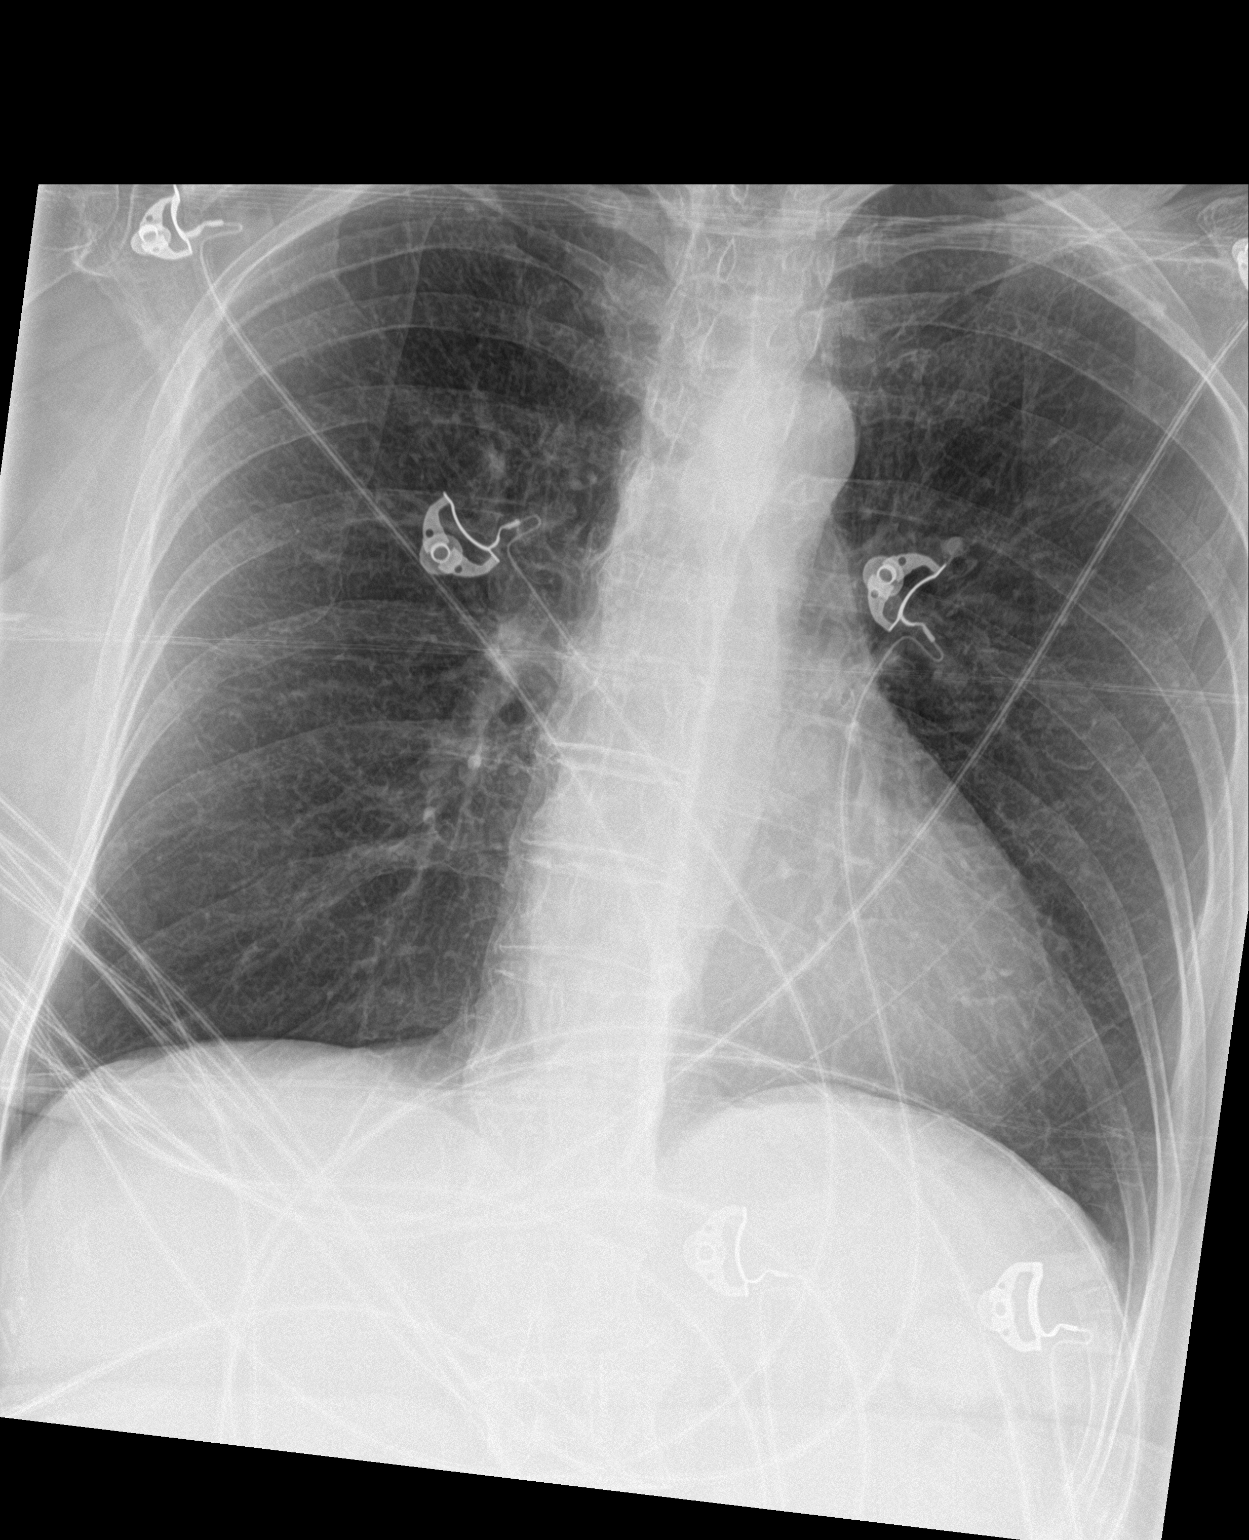

[2 of 2 positions shown; findings below may reference images not displayed]

FINDINGS: Emphysematous changes in the lungs. Normal heart size and pulmonary
vascularity. No focal airspace disease or consolidation in the
lungs. No blunting of costophrenic angles. No pneumothorax.
Mediastinal contours appear intact.
IMPRESSION: Emphysematous changes in the lungs. No evidence of active pulmonary
disease.

## 2020-04-29 DIAGNOSIS — Z48817 Encounter for surgical aftercare following surgery on the skin and subcutaneous tissue: Secondary | ICD-10-CM | POA: Diagnosis not present

## 2020-05-14 DIAGNOSIS — L738 Other specified follicular disorders: Secondary | ICD-10-CM | POA: Diagnosis not present

## 2020-05-14 DIAGNOSIS — C441192 Basal cell carcinoma of skin of left lower eyelid, including canthus: Secondary | ICD-10-CM | POA: Diagnosis not present

## 2020-05-14 DIAGNOSIS — D485 Neoplasm of uncertain behavior of skin: Secondary | ICD-10-CM | POA: Diagnosis not present

## 2020-05-14 DIAGNOSIS — L570 Actinic keratosis: Secondary | ICD-10-CM | POA: Diagnosis not present

## 2020-05-20 DIAGNOSIS — L905 Scar conditions and fibrosis of skin: Secondary | ICD-10-CM | POA: Diagnosis not present

## 2020-06-24 DIAGNOSIS — R7303 Prediabetes: Secondary | ICD-10-CM | POA: Diagnosis not present

## 2020-06-24 DIAGNOSIS — E039 Hypothyroidism, unspecified: Secondary | ICD-10-CM | POA: Diagnosis not present

## 2020-06-24 DIAGNOSIS — I493 Ventricular premature depolarization: Secondary | ICD-10-CM | POA: Diagnosis not present

## 2020-06-24 DIAGNOSIS — G3184 Mild cognitive impairment, so stated: Secondary | ICD-10-CM | POA: Diagnosis not present

## 2020-06-24 DIAGNOSIS — I10 Essential (primary) hypertension: Secondary | ICD-10-CM | POA: Diagnosis not present

## 2020-07-22 DIAGNOSIS — C441192 Basal cell carcinoma of skin of left lower eyelid, including canthus: Secondary | ICD-10-CM | POA: Diagnosis not present

## 2020-12-17 DIAGNOSIS — L298 Other pruritus: Secondary | ICD-10-CM | POA: Diagnosis not present

## 2020-12-17 DIAGNOSIS — D485 Neoplasm of uncertain behavior of skin: Secondary | ICD-10-CM | POA: Diagnosis not present

## 2020-12-17 DIAGNOSIS — C4441 Basal cell carcinoma of skin of scalp and neck: Secondary | ICD-10-CM | POA: Diagnosis not present

## 2020-12-17 DIAGNOSIS — L570 Actinic keratosis: Secondary | ICD-10-CM | POA: Diagnosis not present

## 2020-12-20 DIAGNOSIS — R5383 Other fatigue: Secondary | ICD-10-CM | POA: Diagnosis not present

## 2020-12-20 DIAGNOSIS — R519 Headache, unspecified: Secondary | ICD-10-CM | POA: Diagnosis not present

## 2020-12-20 DIAGNOSIS — K5900 Constipation, unspecified: Secondary | ICD-10-CM | POA: Diagnosis not present

## 2020-12-20 DIAGNOSIS — K5904 Chronic idiopathic constipation: Secondary | ICD-10-CM | POA: Diagnosis not present

## 2020-12-20 DIAGNOSIS — R5381 Other malaise: Secondary | ICD-10-CM | POA: Diagnosis not present

## 2020-12-20 DIAGNOSIS — H65193 Other acute nonsuppurative otitis media, bilateral: Secondary | ICD-10-CM | POA: Diagnosis not present

## 2021-01-07 DIAGNOSIS — C4441 Basal cell carcinoma of skin of scalp and neck: Secondary | ICD-10-CM | POA: Diagnosis not present

## 2021-01-07 DIAGNOSIS — C4491 Basal cell carcinoma of skin, unspecified: Secondary | ICD-10-CM | POA: Diagnosis not present

## 2021-02-26 DIAGNOSIS — Z13220 Encounter for screening for lipoid disorders: Secondary | ICD-10-CM | POA: Diagnosis not present

## 2021-02-26 DIAGNOSIS — G3184 Mild cognitive impairment, so stated: Secondary | ICD-10-CM | POA: Diagnosis not present

## 2021-02-26 DIAGNOSIS — K5909 Other constipation: Secondary | ICD-10-CM | POA: Diagnosis not present

## 2021-02-26 DIAGNOSIS — Z96652 Presence of left artificial knee joint: Secondary | ICD-10-CM | POA: Diagnosis not present

## 2021-02-26 DIAGNOSIS — I10 Essential (primary) hypertension: Secondary | ICD-10-CM | POA: Diagnosis not present

## 2021-02-26 DIAGNOSIS — E039 Hypothyroidism, unspecified: Secondary | ICD-10-CM | POA: Diagnosis not present

## 2021-02-26 DIAGNOSIS — Z0000 Encounter for general adult medical examination without abnormal findings: Secondary | ICD-10-CM | POA: Diagnosis not present

## 2021-02-26 DIAGNOSIS — M75121 Complete rotator cuff tear or rupture of right shoulder, not specified as traumatic: Secondary | ICD-10-CM | POA: Diagnosis not present

## 2021-02-26 DIAGNOSIS — M810 Age-related osteoporosis without current pathological fracture: Secondary | ICD-10-CM | POA: Diagnosis not present

## 2021-02-26 DIAGNOSIS — R42 Dizziness and giddiness: Secondary | ICD-10-CM | POA: Diagnosis not present

## 2021-02-26 DIAGNOSIS — R7303 Prediabetes: Secondary | ICD-10-CM | POA: Diagnosis not present

## 2021-02-26 DIAGNOSIS — R1900 Intra-abdominal and pelvic swelling, mass and lump, unspecified site: Secondary | ICD-10-CM | POA: Diagnosis not present

## 2021-02-26 DIAGNOSIS — I493 Ventricular premature depolarization: Secondary | ICD-10-CM | POA: Diagnosis not present

## 2021-02-26 DIAGNOSIS — E538 Deficiency of other specified B group vitamins: Secondary | ICD-10-CM | POA: Diagnosis not present

## 2021-04-14 DIAGNOSIS — L300 Nummular dermatitis: Secondary | ICD-10-CM | POA: Diagnosis not present

## 2021-04-14 DIAGNOSIS — L570 Actinic keratosis: Secondary | ICD-10-CM | POA: Diagnosis not present

## 2021-04-14 DIAGNOSIS — Z859 Personal history of malignant neoplasm, unspecified: Secondary | ICD-10-CM | POA: Diagnosis not present

## 2021-04-14 DIAGNOSIS — Z872 Personal history of diseases of the skin and subcutaneous tissue: Secondary | ICD-10-CM | POA: Diagnosis not present

## 2021-04-14 DIAGNOSIS — L578 Other skin changes due to chronic exposure to nonionizing radiation: Secondary | ICD-10-CM | POA: Diagnosis not present

## 2021-04-14 DIAGNOSIS — Z85828 Personal history of other malignant neoplasm of skin: Secondary | ICD-10-CM | POA: Diagnosis not present

## 2021-04-14 DIAGNOSIS — L218 Other seborrheic dermatitis: Secondary | ICD-10-CM | POA: Diagnosis not present

## 2021-05-08 DIAGNOSIS — H6121 Impacted cerumen, right ear: Secondary | ICD-10-CM | POA: Diagnosis not present

## 2021-05-08 DIAGNOSIS — G44219 Episodic tension-type headache, not intractable: Secondary | ICD-10-CM | POA: Diagnosis not present

## 2021-05-08 DIAGNOSIS — H7200 Central perforation of tympanic membrane, unspecified ear: Secondary | ICD-10-CM | POA: Diagnosis not present

## 2021-05-08 DIAGNOSIS — H90A21 Sensorineural hearing loss, unilateral, right ear, with restricted hearing on the contralateral side: Secondary | ICD-10-CM | POA: Diagnosis not present

## 2021-05-08 DIAGNOSIS — J3489 Other specified disorders of nose and nasal sinuses: Secondary | ICD-10-CM | POA: Diagnosis not present

## 2021-05-12 DIAGNOSIS — Z114 Encounter for screening for human immunodeficiency virus [HIV]: Secondary | ICD-10-CM | POA: Diagnosis not present

## 2021-05-12 DIAGNOSIS — R440 Auditory hallucinations: Secondary | ICD-10-CM | POA: Diagnosis not present

## 2021-05-12 DIAGNOSIS — F439 Reaction to severe stress, unspecified: Secondary | ICD-10-CM | POA: Diagnosis not present

## 2021-05-12 DIAGNOSIS — R7309 Other abnormal glucose: Secondary | ICD-10-CM | POA: Diagnosis not present

## 2021-05-12 DIAGNOSIS — R519 Headache, unspecified: Secondary | ICD-10-CM | POA: Diagnosis not present

## 2021-05-12 DIAGNOSIS — R413 Other amnesia: Secondary | ICD-10-CM | POA: Diagnosis not present

## 2021-05-21 ENCOUNTER — Other Ambulatory Visit: Payer: Self-pay | Admitting: Gerontology

## 2021-05-21 DIAGNOSIS — R413 Other amnesia: Secondary | ICD-10-CM

## 2021-05-21 DIAGNOSIS — R519 Headache, unspecified: Secondary | ICD-10-CM

## 2021-05-21 DIAGNOSIS — R440 Auditory hallucinations: Secondary | ICD-10-CM

## 2021-05-21 DIAGNOSIS — F439 Reaction to severe stress, unspecified: Secondary | ICD-10-CM

## 2021-05-28 ENCOUNTER — Other Ambulatory Visit: Payer: Self-pay

## 2021-05-28 ENCOUNTER — Ambulatory Visit
Admission: RE | Admit: 2021-05-28 | Discharge: 2021-05-28 | Disposition: A | Discharge status: Home / Self Care | Payer: PPO | Source: Ambulatory Visit | Attending: Gerontology | Admitting: Gerontology

## 2021-05-28 DIAGNOSIS — R413 Other amnesia: Secondary | ICD-10-CM | POA: Insufficient documentation

## 2021-05-28 DIAGNOSIS — F439 Reaction to severe stress, unspecified: Secondary | ICD-10-CM | POA: Insufficient documentation

## 2021-05-28 DIAGNOSIS — R440 Auditory hallucinations: Secondary | ICD-10-CM | POA: Insufficient documentation

## 2021-05-28 DIAGNOSIS — R519 Headache, unspecified: Secondary | ICD-10-CM | POA: Diagnosis not present

## 2021-05-28 MED ORDER — GADOBUTROL 1 MMOL/ML IV SOLN
6.0000 mL | Freq: Once | INTRAVENOUS | Status: AC | PRN
  Administered 2021-05-28: 6 mL via INTRAVENOUS

## 2021-06-12 DIAGNOSIS — R413 Other amnesia: Secondary | ICD-10-CM | POA: Diagnosis not present

## 2021-06-12 DIAGNOSIS — F439 Reaction to severe stress, unspecified: Secondary | ICD-10-CM | POA: Diagnosis not present

## 2021-06-12 DIAGNOSIS — R441 Visual hallucinations: Secondary | ICD-10-CM | POA: Diagnosis not present

## 2021-06-12 DIAGNOSIS — R440 Auditory hallucinations: Secondary | ICD-10-CM | POA: Diagnosis not present

## 2021-06-12 DIAGNOSIS — R519 Headache, unspecified: Secondary | ICD-10-CM | POA: Diagnosis not present

## 2021-07-03 DIAGNOSIS — F0150 Vascular dementia without behavioral disturbance: Secondary | ICD-10-CM | POA: Diagnosis not present

## 2021-07-03 DIAGNOSIS — F0280 Dementia in other diseases classified elsewhere without behavioral disturbance: Secondary | ICD-10-CM | POA: Diagnosis not present

## 2021-07-03 DIAGNOSIS — G309 Alzheimer's disease, unspecified: Secondary | ICD-10-CM | POA: Diagnosis not present

## 2021-07-03 DIAGNOSIS — R441 Visual hallucinations: Secondary | ICD-10-CM | POA: Diagnosis not present

## 2021-07-28 DIAGNOSIS — I781 Nevus, non-neoplastic: Secondary | ICD-10-CM | POA: Diagnosis not present

## 2021-07-28 DIAGNOSIS — L570 Actinic keratosis: Secondary | ICD-10-CM | POA: Diagnosis not present

## 2021-07-30 ENCOUNTER — Emergency Department
Admission: EM | Admit: 2021-07-30 | Discharge: 2021-07-30 | Disposition: A | Discharge status: Home / Self Care | Payer: PPO | Attending: Emergency Medicine | Admitting: Emergency Medicine

## 2021-07-30 ENCOUNTER — Emergency Department: Payer: PPO

## 2021-07-30 ENCOUNTER — Encounter: Payer: Self-pay | Admitting: Emergency Medicine

## 2021-07-30 DIAGNOSIS — E039 Hypothyroidism, unspecified: Secondary | ICD-10-CM | POA: Insufficient documentation

## 2021-07-30 DIAGNOSIS — I10 Essential (primary) hypertension: Secondary | ICD-10-CM | POA: Diagnosis not present

## 2021-07-30 DIAGNOSIS — Z91040 Latex allergy status: Secondary | ICD-10-CM | POA: Diagnosis not present

## 2021-07-30 DIAGNOSIS — Z96652 Presence of left artificial knee joint: Secondary | ICD-10-CM | POA: Diagnosis not present

## 2021-07-30 DIAGNOSIS — Z79899 Other long term (current) drug therapy: Secondary | ICD-10-CM | POA: Diagnosis not present

## 2021-07-30 DIAGNOSIS — Z853 Personal history of malignant neoplasm of breast: Secondary | ICD-10-CM | POA: Insufficient documentation

## 2021-07-30 DIAGNOSIS — R4189 Other symptoms and signs involving cognitive functions and awareness: Secondary | ICD-10-CM | POA: Insufficient documentation

## 2021-07-30 DIAGNOSIS — R519 Headache, unspecified: Secondary | ICD-10-CM | POA: Insufficient documentation

## 2021-07-30 LAB — URINALYSIS, ROUTINE W REFLEX MICROSCOPIC
Bilirubin Urine: NEGATIVE
Glucose, UA: NEGATIVE mg/dL
Hgb urine dipstick: NEGATIVE
Ketones, ur: NEGATIVE mg/dL
Leukocytes,Ua: NEGATIVE
Nitrite: NEGATIVE
Protein, ur: NEGATIVE mg/dL
Specific Gravity, Urine: 1.009 (ref 1.005–1.030)
pH: 8 (ref 5.0–8.0)

## 2021-07-30 LAB — CBC WITH DIFFERENTIAL/PLATELET
Abs Immature Granulocytes: 0.03 10*3/uL (ref 0.00–0.07)
Basophils Absolute: 0.1 10*3/uL (ref 0.0–0.1)
Basophils Relative: 1 %
Eosinophils Absolute: 0.2 10*3/uL (ref 0.0–0.5)
Eosinophils Relative: 2 %
HCT: 43.8 % (ref 36.0–46.0)
Hemoglobin: 14.6 g/dL (ref 12.0–15.0)
Immature Granulocytes: 1 %
Lymphocytes Relative: 23 %
Lymphs Abs: 1.5 10*3/uL (ref 0.7–4.0)
MCH: 29.3 pg (ref 26.0–34.0)
MCHC: 33.3 g/dL (ref 30.0–36.0)
MCV: 88 fL (ref 80.0–100.0)
Monocytes Absolute: 0.6 10*3/uL (ref 0.1–1.0)
Monocytes Relative: 9 %
Neutro Abs: 4 10*3/uL (ref 1.7–7.7)
Neutrophils Relative %: 64 %
Platelets: 203 10*3/uL (ref 150–400)
RBC: 4.98 MIL/uL (ref 3.87–5.11)
RDW: 14.3 % (ref 11.5–15.5)
WBC: 6.3 10*3/uL (ref 4.0–10.5)
nRBC: 0 % (ref 0.0–0.2)

## 2021-07-30 LAB — BASIC METABOLIC PANEL
Anion gap: 9 (ref 5–15)
BUN: 16 mg/dL (ref 8–23)
CO2: 26 mmol/L (ref 22–32)
Calcium: 9.3 mg/dL (ref 8.9–10.3)
Chloride: 104 mmol/L (ref 98–111)
Creatinine, Ser: 0.79 mg/dL (ref 0.44–1.00)
GFR, Estimated: >60 mL/min (ref >60)
Glucose, Bld: 100 mg/dL — ABNORMAL HIGH (ref 70–99)
Potassium: 3.8 mmol/L (ref 3.5–5.1)
Sodium: 139 mmol/L (ref 135–145)

## 2021-07-30 MED ORDER — ACETAMINOPHEN 500 MG PO TABS
1000.0000 mg | ORAL_TABLET | Freq: Once | ORAL | Status: AC
  Administered 2021-07-30: 1000 mg via ORAL
  Filled 2021-07-30: qty 2

## 2021-07-30 NOTE — ED Provider Notes (Signed)
[facility] Emergency Department Provider Note ____________________________________________  Time seen: Approximately 11:37 AM  I have reviewed the triage vital signs and the nursing notes.   HISTORY  Chief Complaint Headache   HPI Debra Patel is a 80 y.o. female presents to the emergency department today for evaluation.  Upon awakening this morning, she states that her head felt "crazy inside."  She has been dealing with the symptoms for the past couple of months and has been evaluated by primary care and neurology.  Today, the symptoms seem to last longer than usual.  She states that she is unable to describe how her head feels.  Its not a throbbing pain but it just does not feel "right."  She has had no blurry vision, just feels there is pressure behind them.  She has had some nausea but no vomiting.  She states that she had an episode where her heart felt like it was racing earlier but that has since gone away.  She was able to call her neighbor who came over and called her neurologist.  The nurse at the neurology office recommended that she come to the emergency department.  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   Nephrolithiasis 02/23/2019   Vertigo 10/09/2018   Fracture of neck of femur ([place]) 09/05/2018   Syncope 06/29/2018   Allergy to alpha-gal 12/16/2017   Symptomatic  PVCs 05/11/2017   GERD (gastroesophageal reflux disease) 04/13/2017   Constipation, chronic 03/05/2016   Fever 03/04/2015   Febrile 03/04/2015   Chest pain with low risk for cardiac etiology 02/28/2015   History of pulmonary embolism 01/20/2015   Anxiety 10/23/2014   Amnesia 10/23/2014   Mild cognitive disorder 10/23/2014   Hypertension 08/21/2014   Primary osteoarthritis of left knee 04/06/2014   Adult hypothyroidism 11/24/2013   Palpitations 11/24/2013   Mass of pelvis 11/15/2013   Bad memory 07/27/2012   Complete rupture of rotator cuff 10/26/2011   Breast cancer, left ([place]) 04/21/2000    Past Surgical History:  Procedure Laterality Date   ABDOMINAL HYSTERECTOMY     BREAST EXCISIONAL BIOPSY Right 2002   EXCISIONAL - NEG   CATARACT EXTRACTION W/PHACO Right 06/19/2019   Procedure: CATARACT EXTRACTION PHACO AND INTRAOCULAR LENS PLACEMENT (IOC) RIGHT ISTENT  00:56.3  9.0%  5.13;  Surgeon: Eulogio Bear, MD;  Location: [place];  Service: Ophthalmology;  Laterality: Right;  Latex   CATARACT EXTRACTION W/PHACO Left 07/31/2019   Procedure: CATARACT EXTRACTION PHACO AND INTRAOCULAR LENS PLACEMENT (IOC) LEFT ISTENT INJ 00:48.1  12.9%  6.34;  Surgeon: Eulogio Bear, MD;  Location: [place];  Service: Ophthalmology;  Laterality: Left;   INTRAMEDULLARY (IM) NAIL INTERTROCHANTERIC Left 06/29/2018   Procedure: INTRAMEDULLARY (IM)  NAIL INTERTROCHANTRIC- TFNA;  Surgeon: Lovell Sheehan, MD;  Location: [facility];  Service: Orthopedics;  Laterality: Left;   KNEE ARTHROPLASTY Left 04/03/2019   Procedure: COMPUTER ASSISTED TOTAL KNEE ARTHROPLASTY;  Surgeon: Dereck Leep, MD;  Location: [facility];  Service: Orthopedics;  Laterality: Left;   Mass Removed from Bladder and Both Ovaries     MASTECTOMY Left 2001   BREAST CA   THYROIDECTOMY Left     Prior to Admission medications   Medication Sig Start Date End Date Taking? Authorizing Provider  Apoaequorin (PREVAGEN  EXTRA STRENGTH) 20 MG CAPS Take 20 mg by mouth daily.    [provider]  bisacodyl (DULCOLAX) 5 MG EC tablet Take 5 mg by mouth daily as needed for moderate constipation (takes every 8 - 10 days).    [provider]  Cholecalciferol (VITAMIN D3) 25 MCG (1000 UT) CAPS Take 1,000 Units by mouth daily.    [provider]  hydrocortisone cream 1 % Apply 1 application topically as needed for itching.    [provider]  levothyroxine (SYNTHROID, LEVOTHROID) 50 MCG tablet Take 50 mcg by mouth daily before breakfast.     [provider]  metoprolol succinate (TOPROL-XL) 25 MG 24 hr tablet Take 25 mg by mouth daily as needed (for palpitations).    [provider]  oxymetazoline (AFRIN) 0.05 % nasal spray Place 2 sprays into both nostrils as needed for congestion.    [provider]  traMADol (ULTRAM) 50 MG tablet Take 1-2 tablets (50-100 mg total) by mouth every 4 (four) hours as needed for moderate pain. Patient not taking: Reported on 06/13/2019 04/04/19   Watt Climes, PA  vitamin C (ASCORBIC ACID) 500 MG tablet Take 500 mg by mouth daily.    [provider]  zinc gluconate 50 MG tablet Take 50 mg by mouth daily.    [provider]    Allergies Pyridoxine, Hydrocodone, Alpha-gal, Bee venom, Motrin [ibuprofen], Oxycodone, Latex, Nickel, and Nitrofurantoin  Family History  Problem Relation Age of Onset   Breast cancer Mother 59    Social History Social History   Tobacco Use   Smoking status: Never   Smokeless tobacco: Never  Vaping Use   Vaping Use: Never used  Substance Use Topics   Alcohol use: No   Drug use: No    Review of Systems Constitutional: No fever/chills or recent injury. Eyes: No visual changes. ENT: No sore throat. Respiratory: Denies shortness of breath. Gastrointestinal: No abdominal pain.  Positive for nausea, no vomiting.  No diarrhea.  No constipation. Musculoskeletal: Negative for  pain. Skin: Negative for rash. Neurological: Negative for headache, negative for focal weakness or numbness. No confusion or fainting. ___________________________________________   PHYSICAL EXAM:  VITAL SIGNS: ED Triage Vitals  Enc Vitals Group     BP 07/30/21 1119 (!) 168/84     Pulse Rate 07/30/21 1119 72     Resp 07/30/21 1119 20     Temp 07/30/21 1118 98.4 F (36.9 C)     Temp Source 07/30/21 1118 Oral     SpO2 07/30/21 1119 99 %     Weight 07/30/21 1120 145 lb (65.8 kg)     Height 07/30/21 1120 5\' 4"  (1.626 m)     Head Circumference --      Peak Flow --      Pain Score --      Pain Loc --      Pain Edu? --  Excl. in [place]? --     Constitutional: Alert and oriented. Well appearing and in no acute distress. Eyes: Conjunctivae are normal. PERRL. EOMI without expressed pain. No evidence of papilledema on limited exam. Head: Atraumatic. Nose: No congestion/rhinnorhea. Mouth/Throat: Mucous membranes are moist.  Oropharynx non-erythematous. Neck: No stridor. Supple, no meningismus.  Cardiovascular: Normal rate, regular rhythm. Grossly normal heart sounds.  Good peripheral circulation. Respiratory: Normal respiratory effort.  No retractions. Lungs CTAB. Gastrointestinal: Soft and nontender. No distention.  Musculoskeletal: No lower extremity tenderness nor edema.  No joint effusions. Neurologic:  Normal speech and language. No gross focal neurologic deficits are appreciated. No gait instability. Cranial nerves: 2-10 normal as tested. Cerebellar:Normal Romberg, finger-nose-finger, heel to shin, normal gait. Sensorimotor: No aphasia, pronator drift, clonus, sensory loss or abnormal reflexes.  Skin:  Skin is warm, dry and intact. No rash noted. Psychiatric: Mood and affect are normal. Speech and behavior are normal. Normal thought process and cognition.  ____________________________________________   LABS (all labs ordered are listed, but only abnormal results are  displayed)  Labs Reviewed  BASIC METABOLIC PANEL - Abnormal; Notable for the following components:      Result Value   Glucose, Bld 100 (*)    All other components within normal limits  URINALYSIS, ROUTINE W REFLEX MICROSCOPIC - Abnormal; Notable for the following components:   Color, Urine YELLOW (*)    APPearance HAZY (*)    All other components within normal limits  CBC WITH DIFFERENTIAL/PLATELET   ____________________________________________  EKG  Not indicated. ____________________________________________  RADIOLOGY  CT Head Wo Contrast  Result Date: 07/30/2021 CLINICAL DATA:  Headache EXAM: CT HEAD WITHOUT CONTRAST TECHNIQUE: Contiguous axial images were obtained from the base of the skull through the vertex without intravenous contrast. COMPARISON:  Head CT 06/28/2018 and MRI 05/28/2021 FINDINGS: Brain: Stable age related cerebral atrophy, ventriculomegaly and periventricular white matter disease. No extra-axial fluid collections are identified. No CT findings for acute hemispheric infarction or intracranial hemorrhage. No mass lesions. The brainstem and cerebellum are normal. Vascular: Stable vascular calcifications. No aneurysm or hyperdense vessels. Skull: No skull fracture or bone lesions. Sinuses/Orbits: The paranasal sinuses and mastoid air cells are clear. The globes are intact. Other: No scalp lesions or scalp hematoma. IMPRESSION: 1. Stable age related cerebral atrophy, ventriculomegaly and periventricular white matter disease. 2. No acute intracranial findings or mass lesions. Electronically Signed   By: Marijo Sanes M.D.   On: 07/30/2021 12:43   ____________________________________________   PROCEDURES  Procedure(s) performed:  Procedures  Critical Care performed: None ____________________________________________   INITIAL IMPRESSION / ASSESSMENT AND PLAN / ED COURSE  80 year old female presenting to the emergency department for treatment and evaluation of  feeling "crazy" in her head and pressure behind her eyes.  She has had similar symptoms in the past which prompted MRI with and without contrast of the brain (Sept 7, 2022).  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  At that time, she was also experiencing visual and auditory hallucinations.  She is currently prescribed an Exelon patch daily.  She states that she has been compliant with removing 1 before applying another.  She does this nightly at 8 PM.  Plan today will be to do screening labs including urinalysis and get a CT to evaluate for acute changes. Patient aware and agreeable to the plan.  Results are reassuring. Case discussed with ED attending, Dr. Kerman Passey who also spoke  with the patient. Plan is to discharge her home to follow up with Dr. Manuella Ghazi. Patient agreeable with the plan.  Pertinent labs & imaging results that were available during my care of the patient were reviewed by me and considered in my medical decision making (see chart for details). ____________________________________________   FINAL CLINICAL IMPRESSION(S) / ED DIAGNOSES  Final diagnoses:  Anosognosia    ED Discharge Orders     None        Victorino Dike, FNP 07/30/21 1523    Harvest Dark, MD 07/30/21 1542

## 2021-07-30 NOTE — ED Provider Notes (Signed)
-----------------------------------------   1:46 PM on 07/30/2021 ----------------------------------------- Patient seen in conjunction with physician assistant [facility].  Patient has been previously diagnosed with Lewy body dementia, follows up with Dr. Manuella Ghazi of neurology.  Patient states last night and this morning she was having a strange sensation in her head, denies any "pain."  States it is hard for her to describe the sensation, she called neurology and they requested that she come to the emergency department for evaluation.  Patient's evaluation in the emergency department is reassuring including a head CT and lab work.  Given no concerning findings I believe patient is safe for discharge home with outpatient follow-up.  I sent a message to her neurologist regarding today's ER work-up.   Harvest Dark, MD 07/30/21 1348

## 2021-07-30 NOTE — ED Notes (Signed)
Pt in ct 

## 2021-07-30 NOTE — ED Triage Notes (Signed)
Pt has a hx of a brain aneurysm and sent by neurologist. Reports she has been waking up with headaches, eye pressure and intermittent confusion.

## 2021-07-30 NOTE — ED Notes (Signed)
Pt in bed, pt c/o confusion and headache for the past could of days, pt oriented to person, place and time, resps even and unlabored.  Pt moving all extremities, sensation equal bilaterally.

## 2022-04-05 ENCOUNTER — Emergency Department
Admission: EM | Admit: 2022-04-05 | Discharge: 2022-04-05 | Disposition: A | Discharge status: Home / Self Care | Payer: PPO | Attending: Emergency Medicine | Admitting: Emergency Medicine

## 2022-04-05 ENCOUNTER — Emergency Department: Payer: PPO

## 2022-04-05 ENCOUNTER — Other Ambulatory Visit: Payer: Self-pay

## 2022-04-05 DIAGNOSIS — F0390 Unspecified dementia without behavioral disturbance: Secondary | ICD-10-CM | POA: Diagnosis not present

## 2022-04-05 DIAGNOSIS — R112 Nausea with vomiting, unspecified: Secondary | ICD-10-CM | POA: Insufficient documentation

## 2022-04-05 LAB — URINALYSIS, ROUTINE W REFLEX MICROSCOPIC
Bilirubin Urine: NEGATIVE
Glucose, UA: NEGATIVE mg/dL
Hgb urine dipstick: NEGATIVE
Ketones, ur: 40 mg/dL — AB
Leukocytes,Ua: NEGATIVE
Nitrite: NEGATIVE
Protein, ur: NEGATIVE mg/dL
Specific Gravity, Urine: 1.02 (ref 1.005–1.030)
pH: 7 (ref 5.0–8.0)

## 2022-04-05 LAB — CBC
HCT: 42.4 % (ref 36.0–46.0)
Hemoglobin: 14.3 g/dL (ref 12.0–15.0)
MCH: 29.1 pg (ref 26.0–34.0)
MCHC: 33.7 g/dL (ref 30.0–36.0)
MCV: 86.2 fL (ref 80.0–100.0)
Platelets: 219 10*3/uL (ref 150–400)
RBC: 4.92 MIL/uL (ref 3.87–5.11)
RDW: 13.9 % (ref 11.5–15.5)
WBC: 8.4 10*3/uL (ref 4.0–10.5)
nRBC: 0 % (ref 0.0–0.2)

## 2022-04-05 LAB — COMPREHENSIVE METABOLIC PANEL
ALT: 14 U/L (ref 0–44)
AST: 23 U/L (ref 15–41)
Albumin: 4.5 g/dL (ref 3.5–5.0)
Alkaline Phosphatase: 53 U/L (ref 38–126)
Anion gap: 8 (ref 5–15)
BUN: 17 mg/dL (ref 8–23)
CO2: 22 mmol/L (ref 22–32)
Calcium: 9.2 mg/dL (ref 8.9–10.3)
Chloride: 109 mmol/L (ref 98–111)
Creatinine, Ser: 0.93 mg/dL (ref 0.44–1.00)
GFR, Estimated: >60 mL/min (ref >60)
Glucose, Bld: 167 mg/dL — ABNORMAL HIGH (ref 70–99)
Potassium: 3.5 mmol/L (ref 3.5–5.1)
Sodium: 139 mmol/L (ref 135–145)
Total Bilirubin: 0.7 mg/dL (ref 0.3–1.2)
Total Protein: 7.2 g/dL (ref 6.5–8.1)

## 2022-04-05 LAB — LIPASE, BLOOD: Lipase: 35 U/L (ref 11–51)

## 2022-04-05 LAB — TROPONIN I (HIGH SENSITIVITY): Troponin I (High Sensitivity): 6 ng/L (ref <18)

## 2022-04-05 MED ORDER — SODIUM CHLORIDE 0.9 % IV BOLUS
1000.0000 mL | Freq: Once | INTRAVENOUS | Status: AC
  Administered 2022-04-05: 1000 mL via INTRAVENOUS

## 2022-04-05 MED ORDER — ONDANSETRON 4 MG PO TBDP: 4.0000 mg | ORAL_TABLET | Freq: Three times a day (TID) | ORAL | 0 refills | Status: DC | PRN

## 2022-04-05 MED ORDER — ONDANSETRON HCL 4 MG/2ML IJ SOLN
4.0000 mg | Freq: Once | INTRAMUSCULAR | Status: AC
  Administered 2022-04-05: 4 mg via INTRAVENOUS
  Filled 2022-04-05: qty 2

## 2022-04-05 MED ORDER — ONDANSETRON 4 MG PO TBDP
4.0000 mg | ORAL_TABLET | Freq: Once | ORAL | Status: DC
  Filled 2022-04-05: qty 1

## 2022-04-05 NOTE — ED Provider Notes (Signed)
[facility] Provider Note    Event Date/Time   First MD Initiated Contact with Patient 04/05/22 2133     (approximate)  History   Chief Complaint: Emesis  HPI  Debra Patel is a 81 y.o. female with a past medical history of arthritis, mild dementia, presents to the emergency department for nausea and vomiting.  According to the daughter patient is prescribed a dementia "patch" that they placed on the patient once a day.  They were prescribed a higher dose of this medication they tried it last week and the patient became nauseated.  They thought they were putting on the lower dose patch but noted today that they had been using the higher dose patch once again.  Here the patient states she feels nauseated.  Daughter states she has been vomiting all day at home.  Had 1 or 2 episodes of loose stool but none since this morning per daughter.  Patient denies any abdominal pain no chest pain.  No fever.  Physical Exam   Triage Vital Signs: ED Triage Vitals  Enc Vitals Group     BP 04/05/22 1638 (!) 169/98     Pulse Rate 04/05/22 1638 65     Resp 04/05/22 1638 20     Temp 04/05/22 1638 97.7 F (36.5 C)     Temp Source 04/05/22 1638 Oral     SpO2 04/05/22 1638 100 %     Weight 04/05/22 2107 145 lb 8.1 oz (66 kg)     Height 04/05/22 2107 '5\' 4"'$  (1.626 m)     Head Circumference --      Peak Flow --      Pain Score 04/05/22 1625 0     Pain Loc --      Pain Edu? --      Excl. in [place]? --     Most recent vital signs: Vitals:   04/05/22 2130 04/05/22 2200  BP: (!) 150/72 (!) 159/79  Pulse: (!) 53 (!) 57  Resp: 18 16  Temp:    SpO2: 98% 97%    General: Awake, no distress.  CV:  Good peripheral perfusion.  Regular rate and rhythm  Resp:  Normal effort.  Equal breath sounds bilaterally.  Abd:  No distention.  Soft, nontender.  No rebound or guarding.  Benign abdomen   ED Results / Procedures / Treatments   EKG  EKG viewed and interpreted by myself shows  a normal sinus rhythm at 61 bpm with a narrow QRS, normal axis, normal intervals, nonspecific ST changes.  RADIOLOGY  CT scan of the head viewed and interpreted by myself I do not see any large bleed or significant abnormality. Radiology is read the CT scan is negative for acute abnormality.   MEDICATIONS ORDERED IN ED: Medications  ondansetron (ZOFRAN-ODT) disintegrating tablet 4 mg (has no administration in time range)  sodium chloride 0.9 % bolus 1,000 mL (1,000 mLs Intravenous New Bag/Given 04/05/22 2211)  ondansetron (ZOFRAN) injection 4 mg (4 mg Intravenous Given 04/05/22 2211)     IMPRESSION / MDM / ASSESSMENT AND PLAN / ED COURSE  I reviewed the triage vital signs and the nursing notes.  Patient's presentation is most consistent with acute presentation with potential threat to life or bodily function.  Patient presents emergency department for nausea and vomiting since earlier this morning.  Overall the patient appears well, no distress, benign abdomen.  Patient continues to feel nauseated however.  Patient's work-up in the emergency department shows  a normal urinalysis besides a very small amount of ketones likely indicating dehydration.  We will IV hydrate and treat with nausea medication.  Patient's chemistry is normal, troponin is negative and has a reassuring EKG.  Lipase is normal and CBC is normal.  CT scan of the head is negative as well.  Given the patient's reassuring work-up we will continue to treat with supportive care in the emergency department including fluids and treat the nausea with Zofran.  Daughter agreeable to plan of care.  Reassuringly patient has a completely benign abdominal exam and denies any abdominal or chest pain.  I have added on a troponin which has resulted normal.  Urinalysis shows slight amount of ketones otherwise normal.  Given the patient's reassuring work-up I believe patient is safe for discharge home.  Patient states she is feeling much better  after nausea medicine and fluids.  We will discharge with Zofran to be used as needed.  Discussed return precautions.  FINAL CLINICAL IMPRESSION(S) / ED DIAGNOSES   Nausea and vomiting   Note:  This document was prepared using Dragon voice recognition software and may include unintentional dictation errors.   Harvest Dark, MD 04/05/22 2322

## 2022-04-05 NOTE — ED Notes (Signed)
Pt brought to ED rm 10 at this time. This RN now assuming care.

## 2022-04-05 NOTE — ED Triage Notes (Signed)
Pt comes with c/o vomiting. Family reports that pt has patch for her dementia. Family states that she has been given too much medication. Pt has been vomiting since arrival. Pt unable to stop.

## 2022-04-05 NOTE — ED Provider Triage Note (Signed)
Emergency Medicine Provider Triage Evaluation Note  Debra Patel , a 81 y.o. female  was evaluated in triage.  Pt complains of repetitive emesis.  Patient has a history of dementia and is a poor historian.  Family reports that they believe she is taking too much of her dementia medication.  Ongoing emesis, no reported fevers, URI symptoms, urinary changes..  Review of Systems  Positive: Repetitive emesis Negative: Fevers, diarrhea, urinary changes  Physical Exam  There were no vitals taken for this visit. Gen:   Awake, no distress  Resp:  Normal effort  MSK:   Moves extremities without difficulty  Other:  Patient with repetitive emesis evaluated in triage  Medical Decision Making  Medically screening exam initiated at 4:45 PM.  Appropriate orders placed.  Malachi Paradise was informed that the remainder of the evaluation will be completed by another provider, this initial triage assessment does not replace that evaluation, and the importance of remaining in the ED until their evaluation is complete.  Patient with repetitive emesis per family.  History of  dementia and is not able to provide majority of history.  Family reports that they believe that she has taken too much of her dementia medication.  Patient will have labs, urinalysis, CT scan of the head at this time.   Darletta Moll, PA-C 04/05/22 1645

## 2022-05-15 ENCOUNTER — Encounter: Payer: Self-pay | Admitting: Emergency Medicine

## 2022-05-15 ENCOUNTER — Ambulatory Visit
Admission: EM | Admit: 2022-05-15 | Discharge: 2022-05-15 | Disposition: A | Discharge status: Home / Self Care | Payer: PPO

## 2022-05-15 DIAGNOSIS — T7840XA Allergy, unspecified, initial encounter: Secondary | ICD-10-CM

## 2022-05-15 DIAGNOSIS — T63441A Toxic effect of venom of bees, accidental (unintentional), initial encounter: Secondary | ICD-10-CM

## 2022-05-15 MED ORDER — DIPHENHYDRAMINE HCL 12.5 MG/5ML PO ELIX
25.0000 mg | ORAL_SOLUTION | Freq: Once | ORAL | Status: AC
  Administered 2022-05-15: 25 mg via ORAL

## 2022-05-15 MED ORDER — PREDNISONE 10 MG (21) PO TBPK: ORAL_TABLET | ORAL | 0 refills | Status: DC

## 2022-05-15 MED ORDER — EPINEPHRINE 0.3 MG/0.3ML IJ SOAJ: 0.3000 mg | INTRAMUSCULAR | 0 refills | Status: DC | PRN

## 2022-05-15 MED ORDER — DEXAMETHASONE SODIUM PHOSPHATE 10 MG/ML IJ SOLN
10.0000 mg | Freq: Once | INTRAMUSCULAR | Status: AC
  Administered 2022-05-15: 10 mg via INTRAMUSCULAR

## 2022-05-15 NOTE — Discharge Instructions (Addendum)
Take over-the-counter Allegra 180 mg daily or Zyrtec or Claritin 10 mg daily to help with your itching.  You can take over-the-counter Benadryl, 50 mg at bedtime, as needed for itching and sleep.  Take the prednisone pack according to the package instructions.  You will taken on tapering dose over a period of 6 days.  Take it with food and always take it first in the morning with breakfast.  Take over-the-counter Pepcid 20 mg twice daily to help with itching as well.  If you develop any swelling of your lips or tongue, tightness in your throat, or difficulty breathing you need to go to the ER for evaluation.  

## 2022-05-15 NOTE — ED Triage Notes (Signed)
Patient states that she stung by some bees today and started having redness and swelling in her arms.  Patient denies any SOB or difficulty swallowing.  Patient states that she has not used her Epipen or taking any Benadryl.

## 2022-05-15 NOTE — ED Provider Notes (Signed)
[facility]    CSN: [id number] Arrival date & time: 05/15/22  1524      History   Chief Complaint Chief Complaint  Patient presents with   Insect Bite    Bees    HPI Debra Patel is a 81 y.o. female.   HPI  81 year old female here for evaluation of bee sting.  Patient reports that she was sweeping off her porch when she was stung 3 times, once on her right hand, once on her left arm, and once on her right thigh.  She denies any shortness of breath, tightness of her throat, or swelling of lips or tongue.  The areas are hot, red, tender, and itching.  She has required an EpiPen in the past but she did not take 1 this time as hers have all expired.  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   Nephrolithiasis 02/23/2019   Vertigo 10/09/2018   Fracture of neck of femur ([place]) 09/05/2018   Syncope 06/29/2018   Allergy to alpha-gal 12/16/2017   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   GERD (gastroesophageal reflux disease) 04/13/2017   Constipation, chronic 03/05/2016   Fever 03/04/2015   Febrile 03/04/2015   Chest pain with low risk for cardiac etiology 02/28/2015   History of pulmonary embolism 01/20/2015   Anxiety 10/23/2014   Amnesia 10/23/2014   Mild cognitive disorder 10/23/2014   Hypertension 08/21/2014   Primary osteoarthritis of left knee 04/06/2014   Adult hypothyroidism 11/24/2013    Palpitations 11/24/2013   Mass of pelvis 11/15/2013   Bad memory 07/27/2012   Complete rupture of rotator cuff 10/26/2011   Breast cancer, left ([place]) 04/21/2000    Past Surgical History:  Procedure Laterality Date   ABDOMINAL HYSTERECTOMY     BREAST EXCISIONAL BIOPSY Right 2002   EXCISIONAL - NEG   CATARACT EXTRACTION W/PHACO Right 06/19/2019   Procedure: CATARACT EXTRACTION PHACO AND INTRAOCULAR LENS PLACEMENT (IOC) RIGHT ISTENT  00:56.3  9.0%  5.13;  Surgeon: Eulogio Bear, MD;  Location: [place];  Service: Ophthalmology;  Laterality: Right;  Latex   CATARACT EXTRACTION W/PHACO Left 07/31/2019   Procedure: CATARACT EXTRACTION PHACO AND INTRAOCULAR LENS PLACEMENT (IOC) LEFT ISTENT INJ 00:48.1  12.9%  6.34;  Surgeon: Eulogio Bear, MD;  Location: [place];  Service: Ophthalmology;  Laterality: Left;   INTRAMEDULLARY (IM) NAIL INTERTROCHANTERIC Left 06/29/2018   Procedure: INTRAMEDULLARY (IM) NAIL INTERTROCHANTRIC- TFNA;  Surgeon: Lovell Sheehan, MD;  Location: [facility];  Service: Orthopedics;  Laterality: Left;   KNEE ARTHROPLASTY Left 04/03/2019   Procedure: COMPUTER ASSISTED TOTAL KNEE ARTHROPLASTY;  Surgeon: Dereck Leep, MD;  Location: [facility];  Service: Orthopedics;  Laterality: Left;   Mass Removed from Bladder and Both Ovaries  MASTECTOMY Left 2001   BREAST CA   THYROIDECTOMY Left     OB History   No obstetric history on file.      Home Medications    Prior to Admission medications   Medication Sig Start Date End Date Taking? Authorizing Provider  EPINEPHrine 0.3 mg/0.3 mL IJ SOAJ injection Inject 0.3 mg into the muscle as needed for anaphylaxis. 05/15/22  Yes Margarette Canada, NP  predniSONE (STERAPRED UNI-PAK 21 TAB) 10 MG (21) TBPK tablet Take 6 tablets on day 1, 5 tablets day 2, 4 tablets day 3, 3 tablets day 4, 2 tablets day 5, 1 tablet day 6 05/15/22  Yes Margarette Canada, NP  rivastigmine (EXELON) 4.6 mg/24hr 4.6 mg daily. 05/05/22  Yes  [provider]  Apoaequorin (PREVAGEN EXTRA STRENGTH) 20 MG CAPS Take 20 mg by mouth daily.    [provider]  bisacodyl (DULCOLAX) 5 MG EC tablet Take 5 mg by mouth daily as needed for moderate constipation (takes every 8 - 10 days).    [provider]  hydrocortisone cream 1 % Apply 1 application topically as needed for itching.    [provider]  ondansetron (ZOFRAN-ODT) 4 MG disintegrating tablet Take 1 tablet (4 mg total) by mouth every 8 (eight) hours as needed for nausea or vomiting. 04/05/22   Harvest Dark, MD    Family History Family History  Problem Relation Age of Onset   Breast cancer Mother 22    Social History Social History   Tobacco Use   Smoking status: Never   Smokeless tobacco: Never  Vaping Use   Vaping Use: Never used  Substance Use Topics   Alcohol use: No   Drug use: No     Allergies   Pyridoxine, Hydrocodone, Alpha-gal, Bee venom, Motrin [ibuprofen], Oxycodone, Latex, Nickel, and Nitrofurantoin   Review of Systems Review of Systems  HENT:  Negative for facial swelling and trouble swallowing.   Respiratory:  Negative for shortness of breath and wheezing.   Skin:  Positive for color change and wound.  Hematological: Negative.      Physical Exam Triage Vital Signs ED Triage Vitals  Enc Vitals Group     BP      Pulse      Resp      Temp      Temp src      SpO2      Weight      Height      Head Circumference      Peak Flow      Pain Score      Pain Loc      Pain Edu?      Excl. in [place]?    No data found.  Updated Vital Signs BP (!) 156/109 (BP Location: Left Arm)   Pulse 84   Temp 97.6 F (36.4 C) (Oral)   Resp 14   Ht '5\' 4"'$  (1.626 m)   Wt 145 lb 8.1 oz (66 kg)   SpO2 94%   BMI 24.98 kg/m   Visual Acuity Right Eye Distance:   Left Eye Distance:   Bilateral Distance:    Right Eye Near:   Left Eye Near:    Bilateral Near:     Physical Exam Vitals and nursing note reviewed.   Constitutional:      Appearance: Normal appearance. She is not ill-appearing.  HENT:     Head: Normocephalic and atraumatic.  Cardiovascular:     Rate and Rhythm: Normal rate  and regular rhythm.     Pulses: Normal pulses.     Heart sounds: Normal heart sounds. No murmur heard.    No friction rub. No gallop.  Pulmonary:     Effort: Pulmonary effort is normal.     Breath sounds: Normal breath sounds. No stridor. No wheezing, rhonchi or rales.  Skin:    General: Skin is warm and dry.     Capillary Refill: Capillary refill takes less than 2 seconds.     Findings: Erythema present.  Neurological:     General: No focal deficit present.     Mental Status: She is alert and oriented to person, place, and time.  Psychiatric:        Mood and Affect: Mood normal.        Behavior: Behavior normal.        Thought Content: Thought content normal.        Judgment: Judgment normal.      UC Treatments / Results  Labs (all labs ordered are listed, but only abnormal results are displayed) Labs Reviewed - No data to display  EKG   Radiology No results found.  Procedures Procedures (including critical care time)  Medications Ordered in UC Medications  dexamethasone (DECADRON) injection 10 mg (10 mg Intramuscular Given 05/15/22 1545)  diphenhydrAMINE (BENADRYL) 12.5 MG/5ML elixir 25 mg (25 mg Oral Given 05/15/22 1546)    Initial Impression / Assessment and Plan / UC Course  I have reviewed the triage vital signs and the nursing notes.  Pertinent labs & imaging results that were available during my care of the patient were reviewed by me and considered in my medical decision making (see chart for details).   Patient is a pleasant, nontoxic-appearing 81 year old female here for evaluation of bee stings to her right hand, left arm, and right thigh that occurred approximately 30 minutes ago.  She has not taken anything for the stings.  She reports that she has been treated in the past with  a shot of steroids and monitoring.  The last time she was stung she was discharged home with an EpiPen but she is never used it for her bee sting.  She has a documented allergy to bees but the only anaphylaxis documented is to pyridoxine.  On exam patient is in no acute distress and she does not demonstrate any facial swelling, dyspnea, or tachypnea.  She is able to speak in full sentences and does not have a rushed cadence to her voice.  There is no stridor appreciated when auscultating over the trachea.  Cardiopulmonary exam reveals S1-S2 heart sounds with regular rate and rhythm and lung sounds that are clear to auscultation in all fields.  Integumentary exam reveals erythematous whelps that are hot to touch on the proximal lateral right thigh, left antecubital, and the lateral dorsum of the right hand.         I will treat patient for allergic reaction to bee sting with 10 mg of Decadron IM in clinic and 25 mg of p.o. Benadryl liquid.  I will discharge her home with a prednisone pack, refill her EpiPen for her, and I recommend that she take Allegra, Claritin, or Zyrtec during the day and Benadryl at night for the next several days to help with the itching and histamine response.  If she develops any tightness of her throat, swelling of her lips or tongue, or difficulty breathing she is to call 911 and go to the ER.   Final Clinical Impressions(s) /  UC Diagnoses   Final diagnoses:  Allergic reaction, initial encounter  Bee sting, accidental or unintentional, initial encounter     Discharge Instructions      Take over-the-counter Allegra 180 mg daily or Zyrtec or Claritin 10 mg daily to help with your itching.  You can take over-the-counter Benadryl, 50 mg at bedtime, as needed for itching and sleep.  Take the prednisone pack according to the package instructions.  You will taken on tapering dose over a period of 6 days.  Take it with food and always take it first in the morning with  breakfast.  Take over-the-counter Pepcid 20 mg twice daily to help with itching as well.  If you develop any swelling of your lips or tongue, tightness in your throat, or difficulty breathing you need to go to the ER for evaluation.      ED Prescriptions     Medication Sig Dispense Auth. Provider   predniSONE (STERAPRED UNI-PAK 21 TAB) 10 MG (21) TBPK tablet Take 6 tablets on day 1, 5 tablets day 2, 4 tablets day 3, 3 tablets day 4, 2 tablets day 5, 1 tablet day 6 21 tablet Margarette Canada, NP   EPINEPHrine 0.3 mg/0.3 mL IJ SOAJ injection Inject 0.3 mg into the muscle as needed for anaphylaxis. 1 each Margarette Canada, NP      PDMP not reviewed this encounter.   Margarette Canada, NP 05/15/22 1551

## 2022-12-01 DIAGNOSIS — J189 Pneumonia, unspecified organism: Secondary | ICD-10-CM | POA: Diagnosis not present

## 2022-12-01 DIAGNOSIS — R051 Acute cough: Secondary | ICD-10-CM | POA: Diagnosis not present

## 2022-12-01 DIAGNOSIS — R918 Other nonspecific abnormal finding of lung field: Secondary | ICD-10-CM | POA: Diagnosis not present

## 2022-12-01 DIAGNOSIS — Z03818 Encounter for observation for suspected exposure to other biological agents ruled out: Secondary | ICD-10-CM | POA: Diagnosis not present

## 2022-12-08 ENCOUNTER — Other Ambulatory Visit: Payer: Self-pay

## 2022-12-08 ENCOUNTER — Emergency Department
Admission: EM | Admit: 2022-12-08 | Discharge: 2022-12-08 | Disposition: A | Discharge status: Home / Self Care | Payer: PPO | Attending: Emergency Medicine | Admitting: Emergency Medicine

## 2022-12-08 ENCOUNTER — Encounter: Payer: Self-pay | Admitting: Emergency Medicine

## 2022-12-08 DIAGNOSIS — R197 Diarrhea, unspecified: Secondary | ICD-10-CM | POA: Insufficient documentation

## 2022-12-08 DIAGNOSIS — K625 Hemorrhage of anus and rectum: Secondary | ICD-10-CM | POA: Diagnosis not present

## 2022-12-08 LAB — COMPREHENSIVE METABOLIC PANEL
ALT: 13 U/L (ref 0–44)
AST: 20 U/L (ref 15–41)
Albumin: 3.9 g/dL (ref 3.5–5.0)
Alkaline Phosphatase: 54 U/L (ref 38–126)
Anion gap: 10 (ref 5–15)
BUN: 13 mg/dL (ref 8–23)
CO2: 27 mmol/L (ref 22–32)
Calcium: 9.4 mg/dL (ref 8.9–10.3)
Chloride: 102 mmol/L (ref 98–111)
Creatinine, Ser: 0.9 mg/dL (ref 0.44–1.00)
GFR, Estimated: >60 mL/min (ref >60)
Glucose, Bld: 112 mg/dL — ABNORMAL HIGH (ref 70–99)
Potassium: 3.6 mmol/L (ref 3.5–5.1)
Sodium: 139 mmol/L (ref 135–145)
Total Bilirubin: 0.8 mg/dL (ref 0.3–1.2)
Total Protein: 7 g/dL (ref 6.5–8.1)

## 2022-12-08 LAB — TYPE AND SCREEN
ABO/RH(D): A NEG
Antibody Screen: NEGATIVE

## 2022-12-08 LAB — CBC
HCT: 41.2 % (ref 36.0–46.0)
Hemoglobin: 13.8 g/dL (ref 12.0–15.0)
MCH: 28.8 pg (ref 26.0–34.0)
MCHC: 33.5 g/dL (ref 30.0–36.0)
MCV: 86 fL (ref 80.0–100.0)
Platelets: 277 10*3/uL (ref 150–400)
RBC: 4.79 MIL/uL (ref 3.87–5.11)
RDW: 14.2 % (ref 11.5–15.5)
WBC: 7.6 10*3/uL (ref 4.0–10.5)
nRBC: 0 % (ref 0.0–0.2)

## 2022-12-08 NOTE — Discharge Instructions (Addendum)
You were seen in the emergency department for dark stool.  Your hemoglobin level (red blood cell count) in the emergency department was normal.  Your Hemoccult was negative and did not show any bleeding.  Your electrolytes were normal.  It is importantly call your primary care physician today to schedule close follow-up appointment to repeat lab work and to be reevaluated.  If you have worsening symptoms or signs of dehydration it is importantly return to the emergency department to be reevaluated.  Stay hydrated and drink plenty of fluids.

## 2022-12-08 NOTE — ED Provider Notes (Signed)
[facility] Provider Note    Event Date/Time   First MD Initiated Contact with Patient 12/08/22 1259     (approximate)   History   Rectal Bleeding   HPI  Debra Patel is a 82 y.o. female past medical history significant for Crohn's disease, who dementia, who presents to the emergency department for rectal bleeding.  Patient states that she has been having dark black stools for multiple months that worsened over the past 1 week.  States that she has been on multiple antibiotics for pneumonia.  Feels that her pneumonia has resolved and improved but continues to be on amoxicillin and a Z-Pak.  Does have history of Crohn's disease.  Prior colonoscopy which she believes was normal.  Denies any abdominal pain, nausea vomiting or fever.  Not on anticoagulation.  Denies any NSAID use or Goody powder use.  No significant alcohol use.  No prior history of a GI bleed.     Physical Exam   Triage Vital Signs: ED Triage Vitals [12/08/22 1124]  Enc Vitals Group     BP 121/69     Pulse Rate 80     Resp 18     Temp 98.3 F (36.8 C)     Temp Source Oral     SpO2 96 %     Weight      Height      Head Circumference      Peak Flow      Pain Score      Pain Loc      Pain Edu?      Excl. in [place]?     Most recent vital signs: Vitals:   12/08/22 1333 12/08/22 1335  BP: (!) 159/79   Pulse:  (!) 57  Resp:    Temp:    SpO2:  98%    Physical Exam Constitutional:      Appearance: She is well-developed.  HENT:     Head: Atraumatic.  Eyes:     Conjunctiva/sclera: Conjunctivae normal.  Cardiovascular:     Rate and Rhythm: Regular rhythm.  Pulmonary:     Effort: No respiratory distress.  Abdominal:     General: There is no distension.  Genitourinary:    Rectum: Normal. Guaiac result negative.     Comments: Brown stool Musculoskeletal:        General: Normal range of motion.     Cervical back: Normal range of motion.  Skin:    General: Skin is warm.   Neurological:     Mental Status: She is alert. Mental status is at baseline.     IMPRESSION / MDM / ASSESSMENT AND PLAN / ED COURSE  I reviewed the triage vital signs and the nursing notes.  Differential diagnosis including upper GI bleed, lower GI bleed, C. difficile, diarrhea, medication side effect  EKG  No tachycardic or bradycardic dysrhythmias while on cardiac telemetry.  LABS (all labs ordered are listed, but only abnormal results are displayed) Labs interpreted as -    Labs Reviewed  COMPREHENSIVE METABOLIC PANEL - Abnormal; Notable for the following components:      Result Value   Glucose, Bld 112 (*)    All other components within normal limits  CBC  POC OCCULT BLOOD, ED  TYPE AND SCREEN     MDM    Lab work reassuring.  Hemoglobin is stable.  No melena on my exam.  Abdomen is nontender to palpation.  No significant electrolyte abnormalities or signs  of dehydration.  Normal BUN.  No signs or symptoms concerning for significant GI bleed.  Unable to provide a stool sample in the emergency department to send for GI pathogen or C. difficile.  Given that the patient has had less than 4 bowel movements a day have a lower suspicion for C. difficile.  Discussed symptomatic treatment with oral hydration.  Discussed close follow-up with primary care physician and to establish a follow-up visit this week.  Low suspicion for Crohn's flare given that the patient does not have any abdominal pain or fever at this time.   PROCEDURES:  Critical Care performed: No  Procedures  Patient's presentation is most consistent with acute presentation with potential threat to life or bodily function.   MEDICATIONS ORDERED IN ED: Medications - No data to display  FINAL CLINICAL IMPRESSION(S) / ED DIAGNOSES   Final diagnoses:  Rectal bleeding  Diarrhea, unspecified type     Rx / DC Orders   ED Discharge Orders     None        Note:  This document was prepared using  Dragon voice recognition software and may include unintentional dictation errors.   Nathaniel Man, MD 12/08/22 1347

## 2022-12-08 NOTE — ED Triage Notes (Signed)
Patient sent to ED from [facility] for Debra Patel tarry stools for the past 2-3 days. Dx pneumonia last week- on meds for same. Hx of dementia

## 2022-12-14 ENCOUNTER — Telehealth: Payer: Self-pay

## 2022-12-14 NOTE — Telephone Encounter (Signed)
        Patient  visited [facility] on 12/08/2022  for Diarrhea, Rectal Bleeding.   Telephone encounter attempt :  1st  A HIPAA compliant voice message was left requesting a return call.  Instructed patient to call back at [phone].   [place] Resource Care Guide   [email or web address]  ?? [phone]   Website: [email or web address]  [email or web address]

## 2022-12-15 ENCOUNTER — Telehealth: Payer: Self-pay

## 2022-12-15 NOTE — Telephone Encounter (Signed)
     Patient  visit on 12/08/2022  at [facility] was for Diarrhea, Rectal Bleeding.  Have you been able to follow up with your primary care physician? Yes  The patient was or was not able to obtain any needed medicine or equipment. Patient was able to obtain medication.  Are there diet recommendations that you are having difficulty following? No  Patient expresses understanding of discharge instructions and education provided has no other needs at this time. Yes  Gave patient information for [employer] [phone].   [place] Resource Care Guide   [email or web address]  ?? [id number]   Website: [email or web address]  [email or web address]

## 2023-01-04 DIAGNOSIS — Z111 Encounter for screening for respiratory tuberculosis: Secondary | ICD-10-CM | POA: Diagnosis not present

## 2023-01-05 DIAGNOSIS — R7303 Prediabetes: Secondary | ICD-10-CM | POA: Diagnosis not present

## 2023-01-05 DIAGNOSIS — Z13220 Encounter for screening for lipoid disorders: Secondary | ICD-10-CM | POA: Diagnosis not present

## 2023-01-05 DIAGNOSIS — Z86711 Personal history of pulmonary embolism: Secondary | ICD-10-CM | POA: Diagnosis not present

## 2023-01-05 DIAGNOSIS — I10 Essential (primary) hypertension: Secondary | ICD-10-CM | POA: Diagnosis not present

## 2023-01-05 DIAGNOSIS — E039 Hypothyroidism, unspecified: Secondary | ICD-10-CM | POA: Diagnosis not present

## 2023-01-05 DIAGNOSIS — M810 Age-related osteoporosis without current pathological fracture: Secondary | ICD-10-CM | POA: Diagnosis not present

## 2023-01-05 DIAGNOSIS — R441 Visual hallucinations: Secondary | ICD-10-CM | POA: Diagnosis not present

## 2023-01-05 DIAGNOSIS — E538 Deficiency of other specified B group vitamins: Secondary | ICD-10-CM | POA: Diagnosis not present

## 2023-01-05 DIAGNOSIS — Z91018 Allergy to other foods: Secondary | ICD-10-CM | POA: Diagnosis not present

## 2023-01-05 DIAGNOSIS — I493 Ventricular premature depolarization: Secondary | ICD-10-CM | POA: Diagnosis not present

## 2023-01-05 DIAGNOSIS — Z0000 Encounter for general adult medical examination without abnormal findings: Secondary | ICD-10-CM | POA: Diagnosis not present

## 2023-01-05 DIAGNOSIS — K5909 Other constipation: Secondary | ICD-10-CM | POA: Diagnosis not present

## 2023-01-05 DIAGNOSIS — G309 Alzheimer's disease, unspecified: Secondary | ICD-10-CM | POA: Diagnosis not present

## 2023-01-05 DIAGNOSIS — R440 Auditory hallucinations: Secondary | ICD-10-CM | POA: Diagnosis not present

## 2023-01-27 DIAGNOSIS — F5105 Insomnia due to other mental disorder: Secondary | ICD-10-CM | POA: Diagnosis not present

## 2023-01-27 DIAGNOSIS — F01518 Vascular dementia, unspecified severity, with other behavioral disturbance: Secondary | ICD-10-CM | POA: Diagnosis not present

## 2023-01-27 DIAGNOSIS — J189 Pneumonia, unspecified organism: Secondary | ICD-10-CM | POA: Diagnosis not present

## 2023-01-27 DIAGNOSIS — G309 Alzheimer's disease, unspecified: Secondary | ICD-10-CM | POA: Diagnosis not present

## 2023-01-27 DIAGNOSIS — J302 Other seasonal allergic rhinitis: Secondary | ICD-10-CM | POA: Diagnosis not present

## 2023-01-27 DIAGNOSIS — T782XXA Anaphylactic shock, unspecified, initial encounter: Secondary | ICD-10-CM | POA: Diagnosis not present

## 2023-01-27 DIAGNOSIS — N8110 Cystocele, unspecified: Secondary | ICD-10-CM | POA: Diagnosis not present

## 2023-01-27 DIAGNOSIS — K5909 Other constipation: Secondary | ICD-10-CM | POA: Diagnosis not present

## 2023-01-27 DIAGNOSIS — E538 Deficiency of other specified B group vitamins: Secondary | ICD-10-CM | POA: Diagnosis not present

## 2023-01-27 DIAGNOSIS — E039 Hypothyroidism, unspecified: Secondary | ICD-10-CM | POA: Diagnosis not present

## 2023-01-27 DIAGNOSIS — M810 Age-related osteoporosis without current pathological fracture: Secondary | ICD-10-CM | POA: Diagnosis not present

## 2023-01-27 DIAGNOSIS — G3183 Dementia with Lewy bodies: Secondary | ICD-10-CM | POA: Diagnosis not present

## 2023-02-02 DIAGNOSIS — E039 Hypothyroidism, unspecified: Secondary | ICD-10-CM | POA: Diagnosis not present

## 2023-02-02 DIAGNOSIS — D519 Vitamin B12 deficiency anemia, unspecified: Secondary | ICD-10-CM | POA: Diagnosis not present

## 2023-02-03 DIAGNOSIS — E559 Vitamin D deficiency, unspecified: Secondary | ICD-10-CM | POA: Diagnosis not present

## 2023-02-03 DIAGNOSIS — E039 Hypothyroidism, unspecified: Secondary | ICD-10-CM | POA: Diagnosis not present

## 2023-02-03 DIAGNOSIS — D519 Vitamin B12 deficiency anemia, unspecified: Secondary | ICD-10-CM | POA: Diagnosis not present

## 2023-02-03 DIAGNOSIS — J309 Allergic rhinitis, unspecified: Secondary | ICD-10-CM | POA: Diagnosis not present

## 2023-02-03 DIAGNOSIS — F0150 Vascular dementia without behavioral disturbance: Secondary | ICD-10-CM | POA: Diagnosis not present

## 2023-03-10 DIAGNOSIS — E559 Vitamin D deficiency, unspecified: Secondary | ICD-10-CM | POA: Diagnosis not present

## 2023-03-10 DIAGNOSIS — D519 Vitamin B12 deficiency anemia, unspecified: Secondary | ICD-10-CM | POA: Diagnosis not present

## 2023-03-10 DIAGNOSIS — J309 Allergic rhinitis, unspecified: Secondary | ICD-10-CM | POA: Diagnosis not present

## 2023-03-10 DIAGNOSIS — E039 Hypothyroidism, unspecified: Secondary | ICD-10-CM | POA: Diagnosis not present

## 2023-03-10 DIAGNOSIS — F0150 Vascular dementia without behavioral disturbance: Secondary | ICD-10-CM | POA: Diagnosis not present

## 2023-03-17 DIAGNOSIS — J309 Allergic rhinitis, unspecified: Secondary | ICD-10-CM | POA: Diagnosis not present

## 2023-03-17 DIAGNOSIS — J029 Acute pharyngitis, unspecified: Secondary | ICD-10-CM | POA: Diagnosis not present

## 2023-03-17 DIAGNOSIS — F0150 Vascular dementia without behavioral disturbance: Secondary | ICD-10-CM | POA: Diagnosis not present

## 2023-04-07 DIAGNOSIS — D519 Vitamin B12 deficiency anemia, unspecified: Secondary | ICD-10-CM | POA: Diagnosis not present

## 2023-04-07 DIAGNOSIS — E039 Hypothyroidism, unspecified: Secondary | ICD-10-CM | POA: Diagnosis not present

## 2023-04-07 DIAGNOSIS — J309 Allergic rhinitis, unspecified: Secondary | ICD-10-CM | POA: Diagnosis not present

## 2023-04-07 DIAGNOSIS — E559 Vitamin D deficiency, unspecified: Secondary | ICD-10-CM | POA: Diagnosis not present

## 2023-04-07 DIAGNOSIS — F0150 Vascular dementia without behavioral disturbance: Secondary | ICD-10-CM | POA: Diagnosis not present

## 2023-05-05 DIAGNOSIS — D519 Vitamin B12 deficiency anemia, unspecified: Secondary | ICD-10-CM | POA: Diagnosis not present

## 2023-05-05 DIAGNOSIS — E559 Vitamin D deficiency, unspecified: Secondary | ICD-10-CM | POA: Diagnosis not present

## 2023-05-05 DIAGNOSIS — F0150 Vascular dementia without behavioral disturbance: Secondary | ICD-10-CM | POA: Diagnosis not present

## 2023-05-05 DIAGNOSIS — E039 Hypothyroidism, unspecified: Secondary | ICD-10-CM | POA: Diagnosis not present

## 2023-05-05 DIAGNOSIS — J309 Allergic rhinitis, unspecified: Secondary | ICD-10-CM | POA: Diagnosis not present

## 2023-06-01 DIAGNOSIS — F0150 Vascular dementia without behavioral disturbance: Secondary | ICD-10-CM | POA: Diagnosis not present

## 2023-06-01 DIAGNOSIS — R5383 Other fatigue: Secondary | ICD-10-CM | POA: Diagnosis not present

## 2023-06-01 DIAGNOSIS — E559 Vitamin D deficiency, unspecified: Secondary | ICD-10-CM | POA: Diagnosis not present

## 2023-06-01 DIAGNOSIS — D519 Vitamin B12 deficiency anemia, unspecified: Secondary | ICD-10-CM | POA: Diagnosis not present

## 2023-06-01 DIAGNOSIS — J309 Allergic rhinitis, unspecified: Secondary | ICD-10-CM | POA: Diagnosis not present

## 2023-06-01 DIAGNOSIS — E039 Hypothyroidism, unspecified: Secondary | ICD-10-CM | POA: Diagnosis not present

## 2023-06-17 DIAGNOSIS — D519 Vitamin B12 deficiency anemia, unspecified: Secondary | ICD-10-CM | POA: Diagnosis not present

## 2023-06-17 DIAGNOSIS — E039 Hypothyroidism, unspecified: Secondary | ICD-10-CM | POA: Diagnosis not present

## 2023-06-22 DIAGNOSIS — J309 Allergic rhinitis, unspecified: Secondary | ICD-10-CM | POA: Diagnosis not present

## 2023-06-22 DIAGNOSIS — F0150 Vascular dementia without behavioral disturbance: Secondary | ICD-10-CM | POA: Diagnosis not present

## 2023-06-22 DIAGNOSIS — D519 Vitamin B12 deficiency anemia, unspecified: Secondary | ICD-10-CM | POA: Diagnosis not present

## 2023-06-22 DIAGNOSIS — R5383 Other fatigue: Secondary | ICD-10-CM | POA: Diagnosis not present

## 2023-06-22 DIAGNOSIS — E559 Vitamin D deficiency, unspecified: Secondary | ICD-10-CM | POA: Diagnosis not present

## 2023-06-22 DIAGNOSIS — E039 Hypothyroidism, unspecified: Secondary | ICD-10-CM | POA: Diagnosis not present

## 2023-07-14 DIAGNOSIS — E039 Hypothyroidism, unspecified: Secondary | ICD-10-CM | POA: Diagnosis not present

## 2023-07-14 DIAGNOSIS — R5382 Chronic fatigue, unspecified: Secondary | ICD-10-CM | POA: Diagnosis not present

## 2023-07-27 DIAGNOSIS — E559 Vitamin D deficiency, unspecified: Secondary | ICD-10-CM | POA: Diagnosis not present

## 2023-07-27 DIAGNOSIS — F0150 Vascular dementia without behavioral disturbance: Secondary | ICD-10-CM | POA: Diagnosis not present

## 2023-07-27 DIAGNOSIS — R5382 Chronic fatigue, unspecified: Secondary | ICD-10-CM | POA: Diagnosis not present

## 2023-07-27 DIAGNOSIS — E039 Hypothyroidism, unspecified: Secondary | ICD-10-CM | POA: Diagnosis not present

## 2023-07-27 DIAGNOSIS — D519 Vitamin B12 deficiency anemia, unspecified: Secondary | ICD-10-CM | POA: Diagnosis not present

## 2023-07-27 DIAGNOSIS — J309 Allergic rhinitis, unspecified: Secondary | ICD-10-CM | POA: Diagnosis not present

## 2023-08-17 DIAGNOSIS — E039 Hypothyroidism, unspecified: Secondary | ICD-10-CM | POA: Diagnosis not present

## 2023-08-17 DIAGNOSIS — R5382 Chronic fatigue, unspecified: Secondary | ICD-10-CM | POA: Diagnosis not present

## 2023-08-24 DIAGNOSIS — J309 Allergic rhinitis, unspecified: Secondary | ICD-10-CM | POA: Diagnosis not present

## 2023-08-24 DIAGNOSIS — D519 Vitamin B12 deficiency anemia, unspecified: Secondary | ICD-10-CM | POA: Diagnosis not present

## 2023-08-24 DIAGNOSIS — F0150 Vascular dementia without behavioral disturbance: Secondary | ICD-10-CM | POA: Diagnosis not present

## 2023-08-24 DIAGNOSIS — E559 Vitamin D deficiency, unspecified: Secondary | ICD-10-CM | POA: Diagnosis not present

## 2023-08-24 DIAGNOSIS — E039 Hypothyroidism, unspecified: Secondary | ICD-10-CM | POA: Diagnosis not present

## 2023-09-10 DIAGNOSIS — E039 Hypothyroidism, unspecified: Secondary | ICD-10-CM | POA: Diagnosis not present

## 2023-09-10 DIAGNOSIS — R5382 Chronic fatigue, unspecified: Secondary | ICD-10-CM | POA: Diagnosis not present

## 2023-09-30 DIAGNOSIS — D519 Vitamin B12 deficiency anemia, unspecified: Secondary | ICD-10-CM | POA: Diagnosis not present

## 2023-09-30 DIAGNOSIS — F0150 Vascular dementia without behavioral disturbance: Secondary | ICD-10-CM | POA: Diagnosis not present

## 2023-09-30 DIAGNOSIS — R600 Localized edema: Secondary | ICD-10-CM | POA: Diagnosis not present

## 2023-09-30 DIAGNOSIS — E039 Hypothyroidism, unspecified: Secondary | ICD-10-CM | POA: Diagnosis not present

## 2023-09-30 DIAGNOSIS — J309 Allergic rhinitis, unspecified: Secondary | ICD-10-CM | POA: Diagnosis not present

## 2023-09-30 DIAGNOSIS — E559 Vitamin D deficiency, unspecified: Secondary | ICD-10-CM | POA: Diagnosis not present

## 2023-10-20 DIAGNOSIS — N182 Chronic kidney disease, stage 2 (mild): Secondary | ICD-10-CM | POA: Diagnosis not present

## 2023-10-20 DIAGNOSIS — R5382 Chronic fatigue, unspecified: Secondary | ICD-10-CM | POA: Diagnosis not present

## 2023-11-03 DIAGNOSIS — F0150 Vascular dementia without behavioral disturbance: Secondary | ICD-10-CM | POA: Diagnosis not present

## 2023-11-03 DIAGNOSIS — E039 Hypothyroidism, unspecified: Secondary | ICD-10-CM | POA: Diagnosis not present

## 2023-11-03 DIAGNOSIS — D519 Vitamin B12 deficiency anemia, unspecified: Secondary | ICD-10-CM | POA: Diagnosis not present

## 2023-11-03 DIAGNOSIS — E559 Vitamin D deficiency, unspecified: Secondary | ICD-10-CM | POA: Diagnosis not present

## 2023-11-11 DIAGNOSIS — J309 Allergic rhinitis, unspecified: Secondary | ICD-10-CM | POA: Diagnosis not present

## 2023-11-11 DIAGNOSIS — E039 Hypothyroidism, unspecified: Secondary | ICD-10-CM | POA: Diagnosis not present

## 2023-11-11 DIAGNOSIS — D519 Vitamin B12 deficiency anemia, unspecified: Secondary | ICD-10-CM | POA: Diagnosis not present

## 2023-11-11 DIAGNOSIS — F0150 Vascular dementia without behavioral disturbance: Secondary | ICD-10-CM | POA: Diagnosis not present

## 2023-11-11 DIAGNOSIS — E559 Vitamin D deficiency, unspecified: Secondary | ICD-10-CM | POA: Diagnosis not present

## 2023-11-11 DIAGNOSIS — N182 Chronic kidney disease, stage 2 (mild): Secondary | ICD-10-CM | POA: Diagnosis not present

## 2023-11-11 DIAGNOSIS — R600 Localized edema: Secondary | ICD-10-CM | POA: Diagnosis not present

## 2023-12-16 DIAGNOSIS — J309 Allergic rhinitis, unspecified: Secondary | ICD-10-CM | POA: Diagnosis not present

## 2023-12-16 DIAGNOSIS — R600 Localized edema: Secondary | ICD-10-CM | POA: Diagnosis not present

## 2023-12-16 DIAGNOSIS — E559 Vitamin D deficiency, unspecified: Secondary | ICD-10-CM | POA: Diagnosis not present

## 2023-12-16 DIAGNOSIS — N182 Chronic kidney disease, stage 2 (mild): Secondary | ICD-10-CM | POA: Diagnosis not present

## 2023-12-16 DIAGNOSIS — E039 Hypothyroidism, unspecified: Secondary | ICD-10-CM | POA: Diagnosis not present

## 2023-12-16 DIAGNOSIS — D519 Vitamin B12 deficiency anemia, unspecified: Secondary | ICD-10-CM | POA: Diagnosis not present

## 2024-01-19 DIAGNOSIS — D519 Vitamin B12 deficiency anemia, unspecified: Secondary | ICD-10-CM | POA: Diagnosis not present

## 2024-01-19 DIAGNOSIS — R051 Acute cough: Secondary | ICD-10-CM | POA: Diagnosis not present

## 2024-01-19 DIAGNOSIS — R059 Cough, unspecified: Secondary | ICD-10-CM | POA: Diagnosis not present

## 2024-01-19 DIAGNOSIS — E039 Hypothyroidism, unspecified: Secondary | ICD-10-CM | POA: Diagnosis not present

## 2024-01-19 DIAGNOSIS — N182 Chronic kidney disease, stage 2 (mild): Secondary | ICD-10-CM | POA: Diagnosis not present

## 2024-01-19 DIAGNOSIS — F0390 Unspecified dementia without behavioral disturbance: Secondary | ICD-10-CM | POA: Diagnosis not present

## 2024-02-16 DIAGNOSIS — E039 Hypothyroidism, unspecified: Secondary | ICD-10-CM | POA: Diagnosis not present

## 2024-02-16 DIAGNOSIS — J309 Allergic rhinitis, unspecified: Secondary | ICD-10-CM | POA: Diagnosis not present

## 2024-02-16 DIAGNOSIS — F0390 Unspecified dementia without behavioral disturbance: Secondary | ICD-10-CM | POA: Diagnosis not present

## 2024-02-19 DIAGNOSIS — N182 Chronic kidney disease, stage 2 (mild): Secondary | ICD-10-CM | POA: Diagnosis not present

## 2024-02-19 DIAGNOSIS — D519 Vitamin B12 deficiency anemia, unspecified: Secondary | ICD-10-CM | POA: Diagnosis not present

## 2024-02-23 DIAGNOSIS — J309 Allergic rhinitis, unspecified: Secondary | ICD-10-CM | POA: Diagnosis not present

## 2024-02-23 DIAGNOSIS — E039 Hypothyroidism, unspecified: Secondary | ICD-10-CM | POA: Diagnosis not present

## 2024-02-23 DIAGNOSIS — F0390 Unspecified dementia without behavioral disturbance: Secondary | ICD-10-CM | POA: Diagnosis not present

## 2024-03-08 DIAGNOSIS — F32A Depression, unspecified: Secondary | ICD-10-CM | POA: Diagnosis not present

## 2024-03-08 DIAGNOSIS — E039 Hypothyroidism, unspecified: Secondary | ICD-10-CM | POA: Diagnosis not present

## 2024-03-08 DIAGNOSIS — F0390 Unspecified dementia without behavioral disturbance: Secondary | ICD-10-CM | POA: Diagnosis not present

## 2024-03-10 DIAGNOSIS — R4182 Altered mental status, unspecified: Secondary | ICD-10-CM | POA: Diagnosis not present

## 2024-03-17 DIAGNOSIS — N182 Chronic kidney disease, stage 2 (mild): Secondary | ICD-10-CM | POA: Diagnosis not present

## 2024-03-17 DIAGNOSIS — M810 Age-related osteoporosis without current pathological fracture: Secondary | ICD-10-CM | POA: Diagnosis not present

## 2024-03-29 DIAGNOSIS — E039 Hypothyroidism, unspecified: Secondary | ICD-10-CM | POA: Diagnosis not present

## 2024-03-29 DIAGNOSIS — I10 Essential (primary) hypertension: Secondary | ICD-10-CM | POA: Diagnosis not present

## 2024-03-29 DIAGNOSIS — G3183 Dementia with Lewy bodies: Secondary | ICD-10-CM | POA: Diagnosis not present

## 2024-03-29 DIAGNOSIS — N182 Chronic kidney disease, stage 2 (mild): Secondary | ICD-10-CM | POA: Diagnosis not present

## 2024-03-29 DIAGNOSIS — F339 Major depressive disorder, recurrent, unspecified: Secondary | ICD-10-CM | POA: Diagnosis not present

## 2024-03-29 DIAGNOSIS — J309 Allergic rhinitis, unspecified: Secondary | ICD-10-CM | POA: Diagnosis not present

## 2024-04-05 DIAGNOSIS — G3183 Dementia with Lewy bodies: Secondary | ICD-10-CM | POA: Diagnosis not present

## 2024-04-05 DIAGNOSIS — K5900 Constipation, unspecified: Secondary | ICD-10-CM | POA: Diagnosis not present

## 2024-04-05 DIAGNOSIS — E039 Hypothyroidism, unspecified: Secondary | ICD-10-CM | POA: Diagnosis not present

## 2024-04-14 DIAGNOSIS — N182 Chronic kidney disease, stage 2 (mild): Secondary | ICD-10-CM | POA: Diagnosis not present

## 2024-04-18 DIAGNOSIS — G309 Alzheimer's disease, unspecified: Secondary | ICD-10-CM | POA: Diagnosis not present

## 2024-04-18 DIAGNOSIS — N182 Chronic kidney disease, stage 2 (mild): Secondary | ICD-10-CM | POA: Diagnosis not present

## 2024-05-03 DIAGNOSIS — N390 Urinary tract infection, site not specified: Secondary | ICD-10-CM | POA: Diagnosis not present

## 2024-05-03 DIAGNOSIS — G3183 Dementia with Lewy bodies: Secondary | ICD-10-CM | POA: Diagnosis not present

## 2024-05-03 DIAGNOSIS — F339 Major depressive disorder, recurrent, unspecified: Secondary | ICD-10-CM | POA: Diagnosis not present

## 2024-05-10 DIAGNOSIS — F0390 Unspecified dementia without behavioral disturbance: Secondary | ICD-10-CM | POA: Diagnosis not present

## 2024-05-10 DIAGNOSIS — J309 Allergic rhinitis, unspecified: Secondary | ICD-10-CM | POA: Diagnosis not present

## 2024-05-10 DIAGNOSIS — R339 Retention of urine, unspecified: Secondary | ICD-10-CM | POA: Diagnosis not present

## 2024-05-16 DIAGNOSIS — N182 Chronic kidney disease, stage 2 (mild): Secondary | ICD-10-CM | POA: Diagnosis not present

## 2024-05-16 DIAGNOSIS — G309 Alzheimer's disease, unspecified: Secondary | ICD-10-CM | POA: Diagnosis not present

## 2024-05-17 DIAGNOSIS — E039 Hypothyroidism, unspecified: Secondary | ICD-10-CM | POA: Diagnosis not present

## 2024-05-17 DIAGNOSIS — F0390 Unspecified dementia without behavioral disturbance: Secondary | ICD-10-CM | POA: Diagnosis not present

## 2024-05-17 DIAGNOSIS — N390 Urinary tract infection, site not specified: Secondary | ICD-10-CM | POA: Diagnosis not present

## 2024-05-17 DIAGNOSIS — G3183 Dementia with Lewy bodies: Secondary | ICD-10-CM | POA: Diagnosis not present

## 2024-05-18 DIAGNOSIS — N390 Urinary tract infection, site not specified: Secondary | ICD-10-CM | POA: Diagnosis not present

## 2024-05-31 DIAGNOSIS — G3183 Dementia with Lewy bodies: Secondary | ICD-10-CM | POA: Diagnosis not present

## 2024-05-31 DIAGNOSIS — F0390 Unspecified dementia without behavioral disturbance: Secondary | ICD-10-CM | POA: Diagnosis not present

## 2024-05-31 DIAGNOSIS — E039 Hypothyroidism, unspecified: Secondary | ICD-10-CM | POA: Diagnosis not present

## 2024-05-31 DIAGNOSIS — K5900 Constipation, unspecified: Secondary | ICD-10-CM | POA: Diagnosis not present

## 2024-06-04 ENCOUNTER — Emergency Department

## 2024-06-04 ENCOUNTER — Inpatient Hospital Stay: Admitting: Certified Registered"

## 2024-06-04 ENCOUNTER — Inpatient Hospital Stay

## 2024-06-04 ENCOUNTER — Other Ambulatory Visit: Payer: Self-pay

## 2024-06-04 ENCOUNTER — Encounter
Admission: EM | Disposition: A | Discharge status: Skilled Nursing Facility | Payer: Self-pay | Source: Skilled Nursing Facility | Attending: Internal Medicine

## 2024-06-04 ENCOUNTER — Encounter (HOSPITAL_COMMUNITY): Payer: Self-pay

## 2024-06-04 ENCOUNTER — Inpatient Hospital Stay
Admission: EM | Admit: 2024-06-04 | Discharge: 2024-06-10 | DRG: 481 | Disposition: A | Discharge status: Skilled Nursing Facility | Source: Skilled Nursing Facility | Attending: Internal Medicine | Admitting: Internal Medicine

## 2024-06-04 DIAGNOSIS — Z853 Personal history of malignant neoplasm of breast: Secondary | ICD-10-CM

## 2024-06-04 DIAGNOSIS — Z96652 Presence of left artificial knee joint: Secondary | ICD-10-CM | POA: Diagnosis not present

## 2024-06-04 DIAGNOSIS — Z90710 Acquired absence of both cervix and uterus: Secondary | ICD-10-CM

## 2024-06-04 DIAGNOSIS — Z87442 Personal history of urinary calculi: Secondary | ICD-10-CM

## 2024-06-04 DIAGNOSIS — Z961 Presence of intraocular lens: Secondary | ICD-10-CM | POA: Diagnosis not present

## 2024-06-04 DIAGNOSIS — W19XXXA Unspecified fall, initial encounter: Principal | ICD-10-CM

## 2024-06-04 DIAGNOSIS — Z7401 Bed confinement status: Secondary | ICD-10-CM | POA: Diagnosis not present

## 2024-06-04 DIAGNOSIS — Z91040 Latex allergy status: Secondary | ICD-10-CM

## 2024-06-04 DIAGNOSIS — Z66 Do not resuscitate: Secondary | ICD-10-CM | POA: Diagnosis not present

## 2024-06-04 DIAGNOSIS — S82002A Unspecified fracture of left patella, initial encounter for closed fracture: Secondary | ICD-10-CM | POA: Diagnosis not present

## 2024-06-04 DIAGNOSIS — Z9012 Acquired absence of left breast and nipple: Secondary | ICD-10-CM

## 2024-06-04 DIAGNOSIS — S72352A Displaced comminuted fracture of shaft of left femur, initial encounter for closed fracture: Secondary | ICD-10-CM

## 2024-06-04 DIAGNOSIS — I10 Essential (primary) hypertension: Secondary | ICD-10-CM | POA: Diagnosis not present

## 2024-06-04 DIAGNOSIS — M25552 Pain in left hip: Secondary | ICD-10-CM | POA: Diagnosis not present

## 2024-06-04 DIAGNOSIS — F05 Delirium due to known physiological condition: Secondary | ICD-10-CM | POA: Diagnosis not present

## 2024-06-04 DIAGNOSIS — Z803 Family history of malignant neoplasm of breast: Secondary | ICD-10-CM | POA: Diagnosis not present

## 2024-06-04 DIAGNOSIS — M1990 Unspecified osteoarthritis, unspecified site: Secondary | ICD-10-CM | POA: Diagnosis not present

## 2024-06-04 DIAGNOSIS — Z9221 Personal history of antineoplastic chemotherapy: Secondary | ICD-10-CM

## 2024-06-04 DIAGNOSIS — W06XXXA Fall from bed, initial encounter: Secondary | ICD-10-CM | POA: Diagnosis present

## 2024-06-04 DIAGNOSIS — Z885 Allergy status to narcotic agent status: Secondary | ICD-10-CM

## 2024-06-04 DIAGNOSIS — S72342A Displaced spiral fracture of shaft of left femur, initial encounter for closed fracture: Principal | ICD-10-CM | POA: Diagnosis present

## 2024-06-04 DIAGNOSIS — Z79899 Other long term (current) drug therapy: Secondary | ICD-10-CM

## 2024-06-04 DIAGNOSIS — Z472 Encounter for removal of internal fixation device: Secondary | ICD-10-CM | POA: Diagnosis not present

## 2024-06-04 DIAGNOSIS — Z923 Personal history of irradiation: Secondary | ICD-10-CM | POA: Diagnosis not present

## 2024-06-04 DIAGNOSIS — S72142D Displaced intertrochanteric fracture of left femur, subsequent encounter for closed fracture with routine healing: Secondary | ICD-10-CM | POA: Diagnosis not present

## 2024-06-04 DIAGNOSIS — S79929A Unspecified injury of unspecified thigh, initial encounter: Secondary | ICD-10-CM | POA: Diagnosis not present

## 2024-06-04 DIAGNOSIS — F03911 Unspecified dementia, unspecified severity, with agitation: Secondary | ICD-10-CM | POA: Diagnosis present

## 2024-06-04 DIAGNOSIS — Z79890 Hormone replacement therapy (postmenopausal): Secondary | ICD-10-CM

## 2024-06-04 DIAGNOSIS — Z888 Allergy status to other drugs, medicaments and biological substances status: Secondary | ICD-10-CM

## 2024-06-04 DIAGNOSIS — Z01818 Encounter for other preprocedural examination: Secondary | ICD-10-CM | POA: Diagnosis not present

## 2024-06-04 DIAGNOSIS — M9712XA Periprosthetic fracture around internal prosthetic left knee joint, initial encounter: Secondary | ICD-10-CM | POA: Diagnosis present

## 2024-06-04 DIAGNOSIS — E890 Postprocedural hypothyroidism: Secondary | ICD-10-CM | POA: Diagnosis present

## 2024-06-04 DIAGNOSIS — S7292XA Unspecified fracture of left femur, initial encounter for closed fracture: Secondary | ICD-10-CM | POA: Diagnosis present

## 2024-06-04 DIAGNOSIS — F0390 Unspecified dementia without behavioral disturbance: Secondary | ICD-10-CM | POA: Diagnosis not present

## 2024-06-04 DIAGNOSIS — Z91030 Bee allergy status: Secondary | ICD-10-CM

## 2024-06-04 DIAGNOSIS — Z86711 Personal history of pulmonary embolism: Secondary | ICD-10-CM | POA: Diagnosis not present

## 2024-06-04 DIAGNOSIS — S72142A Displaced intertrochanteric fracture of left femur, initial encounter for closed fracture: Secondary | ICD-10-CM | POA: Diagnosis not present

## 2024-06-04 DIAGNOSIS — R000 Tachycardia, unspecified: Secondary | ICD-10-CM | POA: Diagnosis not present

## 2024-06-04 DIAGNOSIS — Z886 Allergy status to analgesic agent status: Secondary | ICD-10-CM | POA: Diagnosis not present

## 2024-06-04 DIAGNOSIS — M6281 Muscle weakness (generalized): Secondary | ICD-10-CM | POA: Diagnosis not present

## 2024-06-04 DIAGNOSIS — M25559 Pain in unspecified hip: Secondary | ICD-10-CM | POA: Diagnosis not present

## 2024-06-04 DIAGNOSIS — K5900 Constipation, unspecified: Secondary | ICD-10-CM | POA: Diagnosis not present

## 2024-06-04 DIAGNOSIS — E785 Hyperlipidemia, unspecified: Secondary | ICD-10-CM | POA: Diagnosis not present

## 2024-06-04 DIAGNOSIS — G8911 Acute pain due to trauma: Secondary | ICD-10-CM | POA: Diagnosis not present

## 2024-06-04 DIAGNOSIS — R262 Difficulty in walking, not elsewhere classified: Secondary | ICD-10-CM | POA: Diagnosis not present

## 2024-06-04 DIAGNOSIS — M1612 Unilateral primary osteoarthritis, left hip: Secondary | ICD-10-CM | POA: Diagnosis not present

## 2024-06-04 DIAGNOSIS — F03918 Unspecified dementia, unspecified severity, with other behavioral disturbance: Secondary | ICD-10-CM | POA: Diagnosis not present

## 2024-06-04 DIAGNOSIS — S72402A Unspecified fracture of lower end of left femur, initial encounter for closed fracture: Secondary | ICD-10-CM | POA: Diagnosis not present

## 2024-06-04 HISTORY — PX: ORIF FEMUR FRACTURE: SHX2119

## 2024-06-04 LAB — TYPE AND SCREEN
ABO/RH(D): A NEG
Antibody Screen: NEGATIVE

## 2024-06-04 LAB — CBC WITH DIFFERENTIAL/PLATELET
Abs Immature Granulocytes: 0.06 K/uL (ref 0.00–0.07)
Basophils Absolute: 0.1 K/uL (ref 0.0–0.1)
Basophils Relative: 1 %
Eosinophils Absolute: 0.1 K/uL (ref 0.0–0.5)
Eosinophils Relative: 1 %
HCT: 39.6 % (ref 36.0–46.0)
Hemoglobin: 13.2 g/dL (ref 12.0–15.0)
Immature Granulocytes: 1 %
Lymphocytes Relative: 21 %
Lymphs Abs: 1.6 K/uL (ref 0.7–4.0)
MCH: 28.9 pg (ref 26.0–34.0)
MCHC: 33.3 g/dL (ref 30.0–36.0)
MCV: 86.7 fL (ref 80.0–100.0)
Monocytes Absolute: 0.7 K/uL (ref 0.1–1.0)
Monocytes Relative: 9 %
Neutro Abs: 5.3 K/uL (ref 1.7–7.7)
Neutrophils Relative %: 67 %
Platelets: 233 K/uL (ref 150–400)
RBC: 4.57 MIL/uL (ref 3.87–5.11)
RDW: 14.4 % (ref 11.5–15.5)
WBC: 7.8 K/uL (ref 4.0–10.5)
nRBC: 0 % (ref 0.0–0.2)

## 2024-06-04 LAB — PROTIME-INR
INR: 1.1 (ref 0.8–1.2)
Prothrombin Time: 14.4 s (ref 11.4–15.2)

## 2024-06-04 LAB — BASIC METABOLIC PANEL WITH GFR
Anion gap: 12 (ref 5–15)
BUN: 15 mg/dL (ref 8–23)
CO2: 22 mmol/L (ref 22–32)
Calcium: 9 mg/dL (ref 8.9–10.3)
Chloride: 102 mmol/L (ref 98–111)
Creatinine, Ser: 0.82 mg/dL (ref 0.44–1.00)
GFR, Estimated: >60 mL/min (ref >60)
Glucose, Bld: 154 mg/dL — ABNORMAL HIGH (ref 70–99)
Potassium: 3.5 mmol/L (ref 3.5–5.1)
Sodium: 136 mmol/L (ref 135–145)

## 2024-06-04 SURGERY — OPEN REDUCTION INTERNAL FIXATION (ORIF) DISTAL FEMUR FRACTURE
Anesthesia: General | Site: Leg Upper | Laterality: Left

## 2024-06-04 MED ORDER — SUGAMMADEX SODIUM 200 MG/2ML IV SOLN
INTRAVENOUS | Status: DC | PRN
  Administered 2024-06-04: 300 mg via INTRAVENOUS

## 2024-06-04 MED ORDER — ACETAMINOPHEN 10 MG/ML IV SOLN
INTRAVENOUS | Status: DC | PRN
  Administered 2024-06-04: 1000 mg via INTRAVENOUS

## 2024-06-04 MED ORDER — PHENYLEPHRINE 80 MCG/ML (10ML) SYRINGE FOR IV PUSH (FOR BLOOD PRESSURE SUPPORT)
PREFILLED_SYRINGE | INTRAVENOUS | Status: DC | PRN
  Administered 2024-06-04 (×2): 80 ug via INTRAVENOUS

## 2024-06-04 MED ORDER — METOCLOPRAMIDE HCL 5 MG/ML IJ SOLN
5.0000 mg | Freq: Three times a day (TID) | INTRAMUSCULAR | Status: DC | PRN
  Administered 2024-06-06: 5 mg via INTRAVENOUS
  Filled 2024-06-04: qty 2

## 2024-06-04 MED ORDER — PROCHLORPERAZINE EDISYLATE 10 MG/2ML IJ SOLN: 5.0000 mg | Freq: Four times a day (QID) | INTRAMUSCULAR | Status: DC | PRN

## 2024-06-04 MED ORDER — LEVOTHYROXINE SODIUM 50 MCG PO TABS
50.0000 ug | ORAL_TABLET | Freq: Every day | ORAL | Status: DC
  Administered 2024-06-05 – 2024-06-09 (×5): 50 ug via ORAL
  Filled 2024-06-04 (×5): qty 1

## 2024-06-04 MED ORDER — MORPHINE SULFATE (PF) 4 MG/ML IV SOLN
4.0000 mg | Freq: Once | INTRAVENOUS | Status: AC
  Administered 2024-06-04: 4 mg via INTRAVENOUS
  Filled 2024-06-04: qty 1

## 2024-06-04 MED ORDER — MEMANTINE HCL 5 MG PO TABS
5.0000 mg | ORAL_TABLET | Freq: Two times a day (BID) | ORAL | Status: DC
  Administered 2024-06-04 – 2024-06-09 (×10): 5 mg via ORAL
  Filled 2024-06-04 (×10): qty 1

## 2024-06-04 MED ORDER — ENOXAPARIN SODIUM 40 MG/0.4ML IJ SOSY
40.0000 mg | PREFILLED_SYRINGE | INTRAMUSCULAR | Status: DC
  Administered 2024-06-05 – 2024-06-09 (×5): 40 mg via SUBCUTANEOUS
  Filled 2024-06-04 (×5): qty 0.4

## 2024-06-04 MED ORDER — TRAMADOL HCL 50 MG PO TABS: 50.0000 mg | ORAL_TABLET | Freq: Four times a day (QID) | ORAL | Status: DC | PRN

## 2024-06-04 MED ORDER — 0.9 % SODIUM CHLORIDE (POUR BTL) OPTIME
TOPICAL | Status: DC | PRN
  Administered 2024-06-04: 1000 mL

## 2024-06-04 MED ORDER — ACETAMINOPHEN 500 MG PO TABS
1000.0000 mg | ORAL_TABLET | Freq: Four times a day (QID) | ORAL | Status: AC
  Administered 2024-06-04 – 2024-06-05 (×3): 1000 mg via ORAL
  Filled 2024-06-04 (×6): qty 2

## 2024-06-04 MED ORDER — ONDANSETRON HCL 4 MG/2ML IJ SOLN
4.0000 mg | Freq: Once | INTRAMUSCULAR | Status: AC
  Administered 2024-06-04: 4 mg via INTRAVENOUS
  Filled 2024-06-04: qty 2

## 2024-06-04 MED ORDER — SODIUM CHLORIDE 0.9 % IV SOLN: INTRAVENOUS | Status: DC

## 2024-06-04 MED ORDER — ONDANSETRON HCL 4 MG PO TABS: 4.0000 mg | ORAL_TABLET | Freq: Four times a day (QID) | ORAL | Status: DC | PRN

## 2024-06-04 MED ORDER — KETOROLAC TROMETHAMINE 15 MG/ML IJ SOLN
15.0000 mg | Freq: Once | INTRAMUSCULAR | Status: AC
  Administered 2024-06-04: 15 mg via INTRAVENOUS

## 2024-06-04 MED ORDER — METOCLOPRAMIDE HCL 5 MG PO TABS
5.0000 mg | ORAL_TABLET | Freq: Three times a day (TID) | ORAL | Status: DC | PRN
  Administered 2024-06-06 – 2024-06-08 (×2): 10 mg via ORAL
  Filled 2024-06-04 (×2): qty 2

## 2024-06-04 MED ORDER — FLUOXETINE HCL 10 MG PO CAPS
10.0000 mg | ORAL_CAPSULE | Freq: Every day | ORAL | Status: DC
Start: 2024-06-04 — End: 2024-06-10
  Administered 2024-06-05 – 2024-06-09 (×5): 10 mg via ORAL
  Filled 2024-06-04 (×6): qty 1

## 2024-06-04 MED ORDER — FENTANYL CITRATE (PF) 100 MCG/2ML IJ SOLN
INTRAMUSCULAR | Status: AC
  Filled 2024-06-04: qty 2

## 2024-06-04 MED ORDER — ONDANSETRON HCL 4 MG/2ML IJ SOLN
4.0000 mg | Freq: Four times a day (QID) | INTRAMUSCULAR | Status: DC | PRN
  Administered 2024-06-06 (×2): 4 mg via INTRAVENOUS
  Filled 2024-06-04 (×2): qty 2

## 2024-06-04 MED ORDER — ACETAMINOPHEN 325 MG PO TABS
325.0000 mg | ORAL_TABLET | Freq: Four times a day (QID) | ORAL | Status: DC | PRN
  Administered 2024-06-05 – 2024-06-09 (×5): 650 mg via ORAL
  Filled 2024-06-04 (×5): qty 2

## 2024-06-04 MED ORDER — DEXAMETHASONE SODIUM PHOSPHATE 10 MG/ML IJ SOLN
INTRAMUSCULAR | Status: DC | PRN
  Administered 2024-06-04: 10 mg via INTRAVENOUS

## 2024-06-04 MED ORDER — PROPOFOL 10 MG/ML IV BOLUS
INTRAVENOUS | Status: AC
  Filled 2024-06-04: qty 20

## 2024-06-04 MED ORDER — KETOROLAC TROMETHAMINE 15 MG/ML IJ SOLN
7.5000 mg | Freq: Four times a day (QID) | INTRAMUSCULAR | Status: AC
  Administered 2024-06-04 – 2024-06-05 (×2): 7.5 mg via INTRAVENOUS
  Filled 2024-06-04 (×3): qty 1

## 2024-06-04 MED ORDER — PROPOFOL 10 MG/ML IV BOLUS
INTRAVENOUS | Status: DC | PRN
Start: 2024-06-04 — End: 2024-06-04
  Administered 2024-06-04: 120 mg via INTRAVENOUS

## 2024-06-04 MED ORDER — DOCUSATE SODIUM 100 MG PO CAPS
100.0000 mg | ORAL_CAPSULE | Freq: Two times a day (BID) | ORAL | Status: DC
  Administered 2024-06-04 – 2024-06-09 (×9): 100 mg via ORAL
  Filled 2024-06-04 (×10): qty 1

## 2024-06-04 MED ORDER — TRANEXAMIC ACID-NACL 1000-0.7 MG/100ML-% IV SOLN
INTRAVENOUS | Status: DC | PRN
Start: 2024-06-04 — End: 2024-06-04
  Administered 2024-06-04: 1000 mg via INTRAVENOUS

## 2024-06-04 MED ORDER — ROCURONIUM BROMIDE 100 MG/10ML IV SOLN
INTRAVENOUS | Status: DC | PRN
  Administered 2024-06-04: 20 mg via INTRAVENOUS
  Administered 2024-06-04: 50 mg via INTRAVENOUS

## 2024-06-04 MED ORDER — FENTANYL CITRATE (PF) 100 MCG/2ML IJ SOLN
INTRAMUSCULAR | Status: DC | PRN
  Administered 2024-06-04 (×2): 50 ug via INTRAVENOUS

## 2024-06-04 MED ORDER — BUPIVACAINE-EPINEPHRINE 0.5% -1:200000 IJ SOLN
INTRAMUSCULAR | Status: DC | PRN
  Administered 2024-06-04: 50 mL

## 2024-06-04 MED ORDER — LACTATED RINGERS IV SOLN: INTRAVENOUS | Status: DC

## 2024-06-04 MED ORDER — HYDROMORPHONE HCL 1 MG/ML IJ SOLN
INTRAMUSCULAR | Status: AC
  Filled 2024-06-04: qty 1

## 2024-06-04 MED ORDER — BISACODYL 10 MG RE SUPP: 10.0000 mg | Freq: Every day | RECTAL | Status: DC | PRN

## 2024-06-04 MED ORDER — EPHEDRINE SULFATE-NACL 50-0.9 MG/10ML-% IV SOSY
PREFILLED_SYRINGE | INTRAVENOUS | Status: DC | PRN
  Administered 2024-06-04 (×2): 5 mg via INTRAVENOUS

## 2024-06-04 MED ORDER — ACETAMINOPHEN 325 MG PO TABS: 975.0000 mg | ORAL_TABLET | Freq: Three times a day (TID) | ORAL | Status: DC

## 2024-06-04 MED ORDER — CEFAZOLIN SODIUM-DEXTROSE 2-4 GM/100ML-% IV SOLN
2.0000 g | INTRAVENOUS | Status: AC
  Administered 2024-06-04: 2 g via INTRAVENOUS

## 2024-06-04 MED ORDER — HYDROMORPHONE HCL 1 MG/ML IJ SOLN
0.5000 mg | Freq: Once | INTRAMUSCULAR | Status: AC
  Administered 2024-06-04: 0.5 mg via INTRAVENOUS
  Filled 2024-06-04: qty 0.5

## 2024-06-04 MED ORDER — LORATADINE 10 MG PO TABS
10.0000 mg | ORAL_TABLET | Freq: Every day | ORAL | Status: DC
  Administered 2024-06-04 – 2024-06-09 (×5): 10 mg via ORAL
  Filled 2024-06-04 (×5): qty 1

## 2024-06-04 MED ORDER — CEFAZOLIN SODIUM-DEXTROSE 2-4 GM/100ML-% IV SOLN
INTRAVENOUS | Status: AC
  Filled 2024-06-04: qty 100

## 2024-06-04 MED ORDER — DIPHENHYDRAMINE HCL 12.5 MG/5ML PO ELIX: 12.5000 mg | ORAL_SOLUTION | ORAL | Status: DC | PRN

## 2024-06-04 MED ORDER — LIDOCAINE HCL (CARDIAC) PF 100 MG/5ML IV SOSY
PREFILLED_SYRINGE | INTRAVENOUS | Status: DC | PRN
  Administered 2024-06-04: 80 mg via INTRAVENOUS

## 2024-06-04 MED ORDER — CEFAZOLIN SODIUM-DEXTROSE 2-4 GM/100ML-% IV SOLN
2.0000 g | Freq: Four times a day (QID) | INTRAVENOUS | Status: AC
  Administered 2024-06-04 – 2024-06-05 (×2): 2 g via INTRAVENOUS
  Filled 2024-06-04 (×2): qty 100

## 2024-06-04 MED ORDER — MAGNESIUM HYDROXIDE 400 MG/5ML PO SUSP: 30.0000 mL | Freq: Every day | ORAL | Status: DC | PRN

## 2024-06-04 MED ORDER — SPIRONOLACTONE 12.5 MG HALF TABLET
12.5000 mg | ORAL_TABLET | Freq: Every morning | ORAL | Status: DC
  Administered 2024-06-05 – 2024-06-09 (×5): 12.5 mg via ORAL
  Filled 2024-06-04 (×5): qty 1

## 2024-06-04 MED ORDER — ACETAMINOPHEN 10 MG/ML IV SOLN
INTRAVENOUS | Status: AC
  Filled 2024-06-04: qty 100

## 2024-06-04 MED ORDER — LACTATED RINGERS IV SOLN: INTRAVENOUS | Status: DC | PRN

## 2024-06-04 MED ORDER — AZELASTINE HCL 0.1 % NA SOLN: 1.0000 | Freq: Every day | NASAL | Status: DC | PRN

## 2024-06-04 MED ORDER — TRANEXAMIC ACID-NACL 1000-0.7 MG/100ML-% IV SOLN
INTRAVENOUS | Status: AC
  Filled 2024-06-04: qty 100

## 2024-06-04 MED ORDER — KETOROLAC TROMETHAMINE 15 MG/ML IJ SOLN
INTRAMUSCULAR | Status: AC
  Filled 2024-06-04: qty 1

## 2024-06-04 MED ORDER — HYDROMORPHONE HCL 1 MG/ML IJ SOLN: 0.5000 mg | INTRAMUSCULAR | Status: DC | PRN

## 2024-06-04 MED ORDER — HYDROMORPHONE HCL 1 MG/ML IJ SOLN
INTRAMUSCULAR | Status: DC | PRN
  Administered 2024-06-04 (×2): .5 mg via INTRAVENOUS

## 2024-06-04 MED ORDER — FENTANYL CITRATE PF 50 MCG/ML IJ SOSY
50.0000 ug | PREFILLED_SYRINGE | Freq: Once | INTRAMUSCULAR | Status: AC
  Administered 2024-06-04: 50 ug via INTRAVENOUS
  Filled 2024-06-04: qty 1

## 2024-06-04 MED ORDER — FENTANYL CITRATE (PF) 100 MCG/2ML IJ SOLN: 25.0000 ug | INTRAMUSCULAR | Status: DC | PRN

## 2024-06-04 MED ORDER — FLEET ENEMA RE ENEM: 1.0000 | ENEMA | Freq: Once | RECTAL | Status: DC | PRN

## 2024-06-04 MED ORDER — OXYCODONE HCL 5 MG PO TABS
5.0000 mg | ORAL_TABLET | ORAL | Status: DC | PRN
  Administered 2024-06-05: 5 mg via ORAL
  Administered 2024-06-06 – 2024-06-07 (×2): 10 mg via ORAL
  Filled 2024-06-04: qty 1
  Filled 2024-06-04 (×2): qty 2
  Filled 2024-06-04: qty 1

## 2024-06-04 MED ORDER — ONDANSETRON HCL 4 MG/2ML IJ SOLN
INTRAMUSCULAR | Status: DC | PRN
  Administered 2024-06-04: 4 mg via INTRAVENOUS

## 2024-06-04 SURGICAL SUPPLY — 59 items
BIT DRILL 4.3 (BIT) IMPLANT
BIT DRILL 4.3X300MM (BIT) IMPLANT
BIT DRILL LONG 3.3 (BIT) IMPLANT
BIT DRILL QC 3.3X195 (BIT) IMPLANT
BNDG COHESIVE 6X5 TAN ST LF (GAUZE/BANDAGES/DRESSINGS) ×1 IMPLANT
BNDG ELASTIC 6INX 5YD STR LF (GAUZE/BANDAGES/DRESSINGS) IMPLANT
BRACE KNEE POST OP SHORT (BRACE) IMPLANT
CAP LOCK NCB (Cap) IMPLANT
CHLORAPREP W/TINT 26 (MISCELLANEOUS) ×1 IMPLANT
DRAPE C-ARM XRAY 36X54 (DRAPES) ×1 IMPLANT
DRAPE C-ARMOR (DRAPES) ×1 IMPLANT
DRAPE INCISE IOBAN 66X60 STRL (DRAPES) ×2 IMPLANT
DRAPE SURG ORHT 6 SPLT 77X108 (DRAPES) ×2 IMPLANT
DRAPE TABLE BACK 80X90 (DRAPES) ×1 IMPLANT
DRAPE U-SHAPE 47X51 STRL (DRAPES) ×1 IMPLANT
DRSG OPSITE POSTOP 3X4 (GAUZE/BANDAGES/DRESSINGS) IMPLANT
DRSG OPSITE POSTOP 4X6 (GAUZE/BANDAGES/DRESSINGS) ×1 IMPLANT
DRSG OPSITE POSTOP 4X8 (GAUZE/BANDAGES/DRESSINGS) IMPLANT
ELECT CAUTERY BLADE 6.4 (BLADE) IMPLANT
ELECTRODE REM PT RTRN 9FT ADLT (ELECTROSURGICAL) ×1 IMPLANT
GAUZE 4X4 16PLY ~~LOC~~+RFID DBL (SPONGE) ×1 IMPLANT
GAUZE SPONGE 4X4 12PLY STRL (GAUZE/BANDAGES/DRESSINGS) ×1 IMPLANT
GAUZE XEROFORM 1X8 LF (GAUZE/BANDAGES/DRESSINGS) ×1 IMPLANT
GLOVE BIO SURGEON STRL SZ8 (GLOVE) ×2 IMPLANT
GLOVE INDICATOR 8.0 STRL GRN (GLOVE) ×1 IMPLANT
GOWN STRL REUS W/ TWL LRG LVL3 (GOWN DISPOSABLE) ×1 IMPLANT
GOWN STRL REUS W/ TWL XL LVL3 (GOWN DISPOSABLE) ×1 IMPLANT
HOLDER FOLEY CATH W/STRAP (MISCELLANEOUS) IMPLANT
HOLSTER ELECTROSUGICAL PENCIL (MISCELLANEOUS) ×1 IMPLANT
KIT TURNOVER KIT A (KITS) ×1 IMPLANT
KWIRE FXSTD 280X2XNS SS (WIRE) IMPLANT
MANIFOLD NEPTUNE II (INSTRUMENTS) ×1 IMPLANT
NDL HYPO 22X1.5 SAFETY MO (MISCELLANEOUS) IMPLANT
NDL SAFETY ECLIPSE 18X1.5 (NEEDLE) IMPLANT
NEEDLE HYPO 22X1.5 SAFETY MO (MISCELLANEOUS) ×1 IMPLANT
NS IRRIG 1000ML POUR BTL (IV SOLUTION) ×1 IMPLANT
NS IRRIG 500ML POUR BTL (IV SOLUTION) IMPLANT
PACK HIP PROSTHESIS (MISCELLANEOUS) ×1 IMPLANT
PENCIL SMOKE EVACUATOR (MISCELLANEOUS) IMPLANT
PLATE DIST FEM 12H (Plate) IMPLANT
SCREW 5.0 60MM (Screw) IMPLANT
SCREW 5.0 70MM (Screw) IMPLANT
SCREW 5.0 80MM (Screw) IMPLANT
SCREW CORT NCB SELFTAP 5.0X50 (Screw) IMPLANT
SCREW CORTICAL NCB 5.0X40 (Screw) IMPLANT
SCREW NCB 3.5X75X5X6.2XST (Screw) IMPLANT
SCREW NCB 4.0MX44M (Screw) IMPLANT
SCREW NCB 5.0X46MM (Screw) IMPLANT
SLEEVE SCD COMPRESS KNEE MED (STOCKING) IMPLANT
SPONGE T-LAP 18X18 ~~LOC~~+RFID (SPONGE) ×5 IMPLANT
STAPLER SKIN PROX 35W (STAPLE) ×1 IMPLANT
STOCKINETTE M/LG 89821 (MISCELLANEOUS) ×1 IMPLANT
SUT VIC AB 0 CT1 36 (SUTURE) IMPLANT
SUT VIC AB 2-0 CT1 TAPERPNT 27 (SUTURE) ×2 IMPLANT
SYR 30ML LL (SYRINGE) IMPLANT
TRAP FLUID SMOKE EVACUATOR (MISCELLANEOUS) ×1 IMPLANT
TRAY FOLEY SLVR 16FR LF STAT (SET/KITS/TRAYS/PACK) IMPLANT
WATER STERILE IRR 1000ML POUR (IV SOLUTION) IMPLANT
WATER STERILE IRR 500ML POUR (IV SOLUTION) ×1 IMPLANT

## 2024-06-04 NOTE — Op Note (Signed)
 06/04/2024  3:28 PM  Patient:   Debra Patel  Pre-Op Diagnosis:   Closed displaced periprosthetic left distal femur fracture.  Post-Op Diagnosis:   Same  Procedure:   Open reduction and internal fixation of displaced periprosthetic left distal femur fracture.  Surgeon:   [name] Reyes Maltos, MD  Assistant:   None  Anesthesia:   GET  Findings:   As above.  Complications:   None  Fluids:   1000 cc crystalloid  EBL:   150 cc  UOP:   400 cc  TT:   None  Drains:   None  Closure:   Staples  Implants:   [employer] 12-hole precontoured supracondylar femoral plate with 7 distal screws (5 with locking caps) and 4 bicortical proximal screws  Brief Clinical Note:   The patient is an 83 year old female who sustained above-noted injury earlier this morning when she fell out of bed at her assisted living facility. She was brought to the emergency room where x-rays demonstrated the above-noted injury. The patient has been cleared medically and presents at this time for definitive management of her injury.  Procedure:   The patient was brought into the operating room and lain in the supine position. After adequate general endotracheal intubation and anesthesia were obtained, the patient's left lower extremity was prepped with ChloraPrep solution before being draped sterilely.  Preoperative antibiotics were administered. A timeout was performed to verify the appropriate surgical site.   An approximately 10-12 cm incision was made along the lateral aspect of the distal femur, extending down to the joint line laterally. The incision was carried down through the subcutaneous tissues to expose the iliotibial band. This was split the length of the incision to expose the lateral aspect of the lateral femoral condyle. A 12-hole [employer] precontoured supracondylar femoral plate was selected. The plate was carefully positioned utilizing fluoroscopic imaging in AP and lateral projections before it  was temporarily secured using K wires proximally and distally.    A bicortical screw was placed just proximal to the fracture site to help reduce the plate to the femoral shaft. Distally, the plate was secured using 7 fully threaded cancellus screws. The adequacy of screw position was verified fluoroscopically in AP and lateral projections and found to be excellent. Five of these screws were covered with locking caps.  Proximally, the plate was secured using four bicortical screws of appropriate length. These screws were placed through stab incisions. Again the adequacy of hardware position and overall femoral alignment/fracture reduction was verified fluoroscopically in AP and lateral projections and found to be excellent.  The wounds were copiously irrigated with bacitracin  saline solution using bulb irrigation before the iliotibial band was reapproximated using #0 Vicryl interrupted sutures. The subcutaneous tissues were closed in two layers using 2-0 Vicryl interrupted sutures before the skin was closed with staples. A solution of 30 cc of 0.5% Sensorcaine  with epinephrine  and 20 cc of Exparel  was injected in around the incision sites to help with postoperative analgesia before a sterile occlusive dressing was applied to the thigh. The patient was placed into a hinged knee brace with hinges set at 0 to 90 degrees but locked in extension. The patient was then awakened, extubated, and returned to the recovery room in satisfactory condition after tolerating the procedure well.

## 2024-06-04 NOTE — Anesthesia Procedure Notes (Signed)
 Procedure Name: Intubation Date/Time: 06/04/2024 1:10 PM  Performed by: Landy Francena BIRCH, CRNAPre-anesthesia Checklist: Patient identified, Emergency Drugs available, Suction available and Patient being monitored Patient Re-evaluated:Patient Re-evaluated prior to induction Oxygen Delivery Method: Circle system utilized Preoxygenation: Pre-oxygenation with 100% oxygen Induction Type: IV induction Ventilation: Mask ventilation without difficulty Laryngoscope Size: [place] and 3 Grade View: Grade I Tube type: Oral Tube size: 7.0 mm Number of attempts: 1 Airway Equipment and Method: Stylet, Oral airway and Bite block Placement Confirmation: ETT inserted through vocal cords under direct vision, positive ETCO2 and breath sounds checked- equal and bilateral Secured at: 21 cm Tube secured with: Tape Dental Injury: Teeth and Oropharynx as per pre-operative assessment

## 2024-06-04 NOTE — ED Notes (Signed)
 Pt informed of bed assigned and dirty. Pt denies any needs at this time.

## 2024-06-04 NOTE — H&P (Addendum)
 History and Physical    Patient: Debra Patel FMW:[id number] DOB: April 23, 1941 DOA: 06/04/2024 DOS: the patient was seen and examined on 06/04/2024 PCP: Steva Clotilda DEL, NP  Patient coming from: [facility]  Chief Complaint:  Chief Complaint  Patient presents with   Fall   HPI: Debra Patel is a 83 y.o. female with medical history significant of dementia, Hypothyroidism was BIBEMS from [facility] due to mechanical fall out of bed.  Reportedly had a fall out of bed, onto the laminate flooring.  Did not hit her head.  Has severe pain in the left hip and left lower extremity down to the knee.  LLE shortened and externally rotated, and reports severe pain.  Denies headache, neck pain, fever, chills, SOB, chest pain, urinary symptoms  On presentation to the ER afebrile, RR 18, HR 70s, BP 156/74, saturating 100% on room air. Workup revealed BMP unremarkable except mild hyperglycemia, CBC unremarkable, x-ray left knee positive for long segmental spiral, comminuted, periprosthetic fracture of distal left femoral shaft and metadiaphysis. X-ray pelvis partially visualized, acute fracture deformity involving distal left femur.  Healed intertrochanteric fracture of the left hip with intramedullary nail in place.  CT femur shows acute comminuted fracture involving distal half of the femur with impaction of the fracture fragments by approximately 1.6 cm and mild lateral angulation of the distal fracture fragments.  S/p left knee arthroplasty EKG NSR, 72 bpm, QTc prolonged 530  Review of Systems: As mentioned in the history of present illness. All other systems reviewed and are negative. Past Medical History:  Diagnosis Date   Arthritis    BP (high blood pressure) 08/21/2014   no current issues   Brain infection 2008   Breast cancer (HCC) 2001   Left side mastectomy   History of kidney stones    h/o   Hypothyroidism    Personal history of chemotherapy 2001    BREAST CA   Personal history of radiation therapy 2001   BREAST CA   Radiation 2001   BREAST CA   S/P chemotherapy, time since greater than 12 weeks 2001   BREAST CA   Tachycardia    Thyroid  disease    Tick fever    Wears contact lenses    Past Surgical History:  Procedure Laterality Date   ABDOMINAL HYSTERECTOMY     BREAST EXCISIONAL BIOPSY Right 2002   EXCISIONAL - NEG   CATARACT EXTRACTION W/PHACO Right 06/19/2019   Procedure: CATARACT EXTRACTION PHACO AND INTRAOCULAR LENS PLACEMENT (IOC) RIGHT ISTENT  00:56.3  9.0%  5.13;  Surgeon: Myrna Adine Anes, MD;  Location: [facility];  Service: Ophthalmology;  Laterality: Right;  Latex   CATARACT EXTRACTION W/PHACO Left 07/31/2019   Procedure: CATARACT EXTRACTION PHACO AND INTRAOCULAR LENS PLACEMENT (IOC) LEFT ISTENT INJ 00:48.1  12.9%  6.34;  Surgeon: Myrna Adine Anes, MD;  Location: [facility];  Service: Ophthalmology;  Laterality: Left;   INTRAMEDULLARY (IM) NAIL INTERTROCHANTERIC Left 06/29/2018   Procedure: INTRAMEDULLARY (IM) NAIL INTERTROCHANTRIC- TFNA;  Surgeon: Leora Lynwood SAUNDERS, MD;  Location: [facility];  Service: Orthopedics;  Laterality: Left;   KNEE ARTHROPLASTY Left 04/03/2019   Procedure: COMPUTER ASSISTED TOTAL KNEE ARTHROPLASTY;  Surgeon: Mardee Lynwood SQUIBB, MD;  Location: [facility];  Service: Orthopedics;  Laterality: Left;   Mass Removed from Bladder and Both Ovaries     MASTECTOMY Left 2001   BREAST CA   THYROIDECTOMY Left    Social History:  reports that she has  never smoked. She has never used smokeless tobacco. She reports that she does not drink alcohol  and does not use drugs.  Allergies  Allergen Reactions   Pyridoxine Anaphylaxis   Hydrocodone  Nausea Only and Nausea And Vomiting   Alpha-Gal Other (See Comments)    Tick bite, sores on body   Bee Venom Other (See Comments)   Motrin  [Ibuprofen ] Other (See Comments)    depression   Oxycodone  Nausea Only   Latex Rash    NEGATIVE Latex IgE (<0.10)  on 03/29/2019  (Develops rash/itching from compression stockings and underwear elastic)   Nickel Rash   Nitrofurantoin Itching    Family History  Problem Relation Age of Onset   Breast cancer Mother 52    Prior to Admission medications   Medication Sig Start Date End Date Taking? Authorizing Provider  Apoaequorin (PREVAGEN EXTRA STRENGTH) 20 MG CAPS Take 20 mg by mouth daily.    [provider]  bisacodyl  (DULCOLAX) 5 MG EC tablet Take 5 mg by mouth daily as needed for moderate constipation (takes every 8 - 10 days).    [provider]  EPINEPHrine  0.3 mg/0.3 mL IJ SOAJ injection Inject 0.3 mg into the muscle as needed for anaphylaxis. 05/15/22   Bernardino Ditch, NP  hydrocortisone  cream 1 % Apply 1 application topically as needed for itching.    [provider]  ondansetron  (ZOFRAN -ODT) 4 MG disintegrating tablet Take 1 tablet (4 mg total) by mouth every 8 (eight) hours as needed for nausea or vomiting. 04/05/22   Dorothyann Drivers, MD  predniSONE  (STERAPRED UNI-PAK 21 TAB) 10 MG (21) TBPK tablet Take 6 tablets on day 1, 5 tablets day 2, 4 tablets day 3, 3 tablets day 4, 2 tablets day 5, 1 tablet day 6 05/15/22   Bernardino Ditch, NP  rivastigmine (EXELON) 4.6 mg/24hr 4.6 mg daily. 05/05/22   [provider]    Physical Exam: Vitals:   06/04/24 0522 06/04/24 0523 06/04/24 0835  BP: (!) 156/74  137/80  Pulse: 75  89  Resp: 18  20  Temp: 98.2 F (36.8 C)    TempSrc: Oral    SpO2: 100%  100%  Weight:  63.5 kg   Height:  5' 5 (1.651 m)     General:Awake, alert, distressed due to pain. CV:   Good peripheral perfusion.  Regular rate and rhythm. Resp:   Normal effort. Speaking easily and comfortably, no accessory muscle usage nor intercostal retractions.  Lungs clear to auscultation. Abd:   No distention.  No tenderness to palpation. MSK:     Shortened and externally rotated left lower extremity.  Trace pitting edema which appears chronic.  Pain with any  palpation or manipulation particularly of the proximal left femur.  No obvious knee effusion Neuro: Pleasantly confused, no gross focal deficits  Data Reviewed: Labs and imaging reviewed  Assessment and Plan:  Acute comminuted fracture of the left femur - s/p Mechanical Fall, denies LOC - CT femur shows acute comminuted fracture involving distal half of the femur with impaction of the fracture fragments by approximately 1.6 cm and mild lateral angulation of the distal fracture fragments - NPO, IV fluids - Pain management, antiemetics as needed - Orthopedic consult pending - PT/OT eval post procedure  Dementia - Continue Namenda   Hypothyroidism - Continue levothyroxine    Advance Care Planning:   Code Status: Limited: Do not attempt resuscitation (DNR) -DNR-LIMITED -Do Not Intubate/DNI  - discussed with Son  Consults: Orthopedics Surgery  Family Communication: Spoke with  Son, Thom and provided updates  Severity of Illness: The appropriate patient status for this patient is INPATIENT. Inpatient status is judged to be reasonable and necessary in order to provide the required intensity of service to ensure the patient's safety. The patient's presenting symptoms, physical exam findings, and initial radiographic and laboratory data in the context of their chronic comorbidities is felt to place them at high risk for further clinical deterioration. Furthermore, it is not anticipated that the patient will be medically stable for discharge from the hospital within 2 midnights of admission.   * I certify that at the point of admission it is my clinical judgment that the patient will require inpatient hospital care spanning beyond 2 midnights from the point of admission due to high intensity of service, high risk for further deterioration and high frequency of surveillance required.*  Author: Laree Lock, MD 06/04/2024 10:06 AM  For on call review [email or web address].

## 2024-06-04 NOTE — ED Notes (Signed)
 Report given to OR.

## 2024-06-04 NOTE — Transfer of Care (Signed)
 Immediate Anesthesia Transfer of Care Note  Patient: Debra Patel  Procedure(s) Performed: OPEN REDUCTION INTERNAL FIXATION (ORIF) DISTAL FEMUR FRACTURE (Left: Leg Upper)  Patient Location: PACU  Anesthesia Type:General  Level of Consciousness: drowsy  Airway & Oxygen Therapy: Patient Spontanous Breathing and Patient connected to face mask oxygen  Post-op Assessment: Report given to RN, Post -op Vital signs reviewed and stable, and Patient moving all extremities  Post vital signs: Reviewed and stable  Last Vitals:  Vitals Value Taken Time  BP 133/71 06/04/24 15:30  Temp    Pulse 87 06/04/24 15:38  Resp 8 06/04/24 15:38  SpO2 100 % 06/04/24 15:38  Vitals shown include unfiled device data.  Last Pain:  Vitals:   06/04/24 1135  TempSrc: Oral  PainSc:          Complications: No notable events documented.

## 2024-06-04 NOTE — Anesthesia Preprocedure Evaluation (Signed)
 Anesthesia Evaluation  Patient identified by MRN, date of birth, ID band Patient awake    Reviewed: Allergy & Precautions, NPO status , Patient's Chart, lab work & pertinent test results  History of Anesthesia Complications Negative for: history of anesthetic complications  Airway Mallampati: II  TM Distance: >3 FB Neck ROM: Full    Dental no notable dental hx. (+) Chipped   Pulmonary neg pulmonary ROS   Pulmonary exam normal        Cardiovascular hypertension, negative cardio ROS Normal cardiovascular exam     Neuro/Psych  PSYCHIATRIC DISORDERS     Dementia negative neurological ROS     GI/Hepatic negative GI ROS, Neg liver ROS,,,  Endo/Other  negative endocrine ROSHypothyroidism    Renal/GU Renal disease     Musculoskeletal   Abdominal   Peds  Hematology negative hematology ROS (+)   Anesthesia Other Findings Past Medical History: No date: Arthritis 08/21/2014: BP (high blood pressure)     Comment:  no current issues 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 2001: Breast cancer (HCC)     Comment:  Left side mastectomy No date: History of kidney stones     Comment:  h/o No date: Hypothyroidism 2001: Personal history of chemotherapy     Comment:  BREAST CA 2001: Personal history of radiation therapy     Comment:  BREAST CA 2001: Radiation     Comment:  BREAST CA 2001: S/P chemotherapy, time since greater than 12 weeks     Comment:  BREAST CA No date: Tachycardia No date: Thyroid  disease No date: Tick fever No date: Wears contact lenses  Past Surgical History: No date: ABDOMINAL HYSTERECTOMY 2002: BREAST EXCISIONAL BIOPSY; Right     Comment:  EXCISIONAL - NEG 06/19/2019: CATARACT EXTRACTION W/PHACO; Right     Comment:  Procedure: CATARACT EXTRACTION PHACO AND INTRAOCULAR               LENS PLACEMENT (IOC) RIGHT ISTENT  00:56.3  9.0%  5.13;                Surgeon: Myrna Adine Anes, MD;  Location: [facility];  Service: Ophthalmology;  Laterality:               Right;  Latex 07/31/2019: CATARACT EXTRACTION W/PHACO; Left     Comment:  Procedure: CATARACT EXTRACTION PHACO AND INTRAOCULAR               LENS PLACEMENT (IOC) LEFT ISTENT INJ 00:48.1  12.9%                6.34;  Surgeon: Myrna Adine Anes, MD;  Location: [facility];  Service: Ophthalmology;  Laterality: Left; 06/29/2018: INTRAMEDULLARY (IM) NAIL INTERTROCHANTERIC; Left     Comment:  Procedure: INTRAMEDULLARY (IM) NAIL INTERTROCHANTRIC-               TFNA;  Surgeon: Leora Lynwood SAUNDERS, MD;  Location: [facility];              Service: Orthopedics;  Laterality: Left; 04/03/2019: KNEE ARTHROPLASTY; Left     Comment:  Procedure: COMPUTER ASSISTED TOTAL KNEE ARTHROPLASTY;                Surgeon: Mardee Lynwood SQUIBB, MD;  Location: [facility];  Service: Orthopedics;  Laterality: Left; No date: Mass Removed from Bladder and Both Ovaries 2001: MASTECTOMY; Left     Comment:  BREAST CA No date: THYROIDECTOMY; Left  BMI    Body Mass Index: 23.30 kg/m      Reproductive/Obstetrics negative OB ROS                              Anesthesia Physical Anesthesia Plan  ASA: 2  Anesthesia Plan: General ETT   Post-op Pain Management:    Induction: Intravenous  PONV Risk Score and Plan: 3 and Ondansetron  and Dexamethasone   Airway Management Planned: Oral ETT  Additional Equipment:   Intra-op Plan:   Post-operative Plan: Extubation in OR  Informed Consent: I have reviewed the patients History and Physical, chart, labs and discussed the procedure including the risks, benefits and alternatives for the proposed anesthesia with the patient or authorized representative who has indicated his/her understanding and acceptance.     Dental Advisory Given  Plan Discussed with: Anesthesiologist, CRNA and Surgeon  Anesthesia Plan Comments: (Patient consented for risks of anesthesia  including but not limited to:  - adverse reactions to medications - damage to eyes, teeth, lips or other oral mucosa - nerve damage due to positioning  - sore throat or hoarseness - Damage to heart, brain, nerves, lungs, other parts of body or loss of life  Patient voiced understanding and assent.)        Anesthesia Quick Evaluation

## 2024-06-04 NOTE — ED Notes (Signed)
 Pt taken to CT.

## 2024-06-04 NOTE — ED Provider Notes (Signed)
 [facility] Provider Note    Event Date/Time   First MD Initiated Contact with Patient 06/04/24 0515     (approximate)   History   Fall  Level 5 caveat:  history/ROS limited by chronic dementia  HPI Debra Patel is a 83 y.o. female with history of dementia and a DNR order.  She presents by EMS after a fall.  Reportedly she fell out of bed and onto the laminate flooring.  She did not strike her head and has no headache or neck pain but she has severe pain in her left hip and all throughout her left upper leg down to the knee.  It is shortened and externally rotated.  She is conversant but in severe pain.     Physical Exam   Triage Vital Signs: ED Triage Vitals  Encounter Vitals Group     BP 06/04/24 0522 (!) 156/74     Girls Systolic BP Percentile --      Girls Diastolic BP Percentile --      Boys Systolic BP Percentile --      Boys Diastolic BP Percentile --      Pulse Rate 06/04/24 0522 75     Resp 06/04/24 0522 18     Temp 06/04/24 0522 98.2 F (36.8 C)     Temp Source 06/04/24 0522 Oral     SpO2 06/04/24 0522 100 %     Weight 06/04/24 0523 63.5 kg (140 lb)     Height 06/04/24 0523 1.651 m (5' 5)     Head Circumference --      Peak Flow --      Pain Score 06/04/24 0523 9     Pain Loc --      Pain Education --      Exclude from Growth Chart --     Most recent vital signs: Vitals:   06/04/24 0522  BP: (!) 156/74  Pulse: 75  Resp: 18  Temp: 98.2 F (36.8 C)  SpO2: 100%    General: Awake, alert, distressed due to pain. CV:  Good peripheral perfusion.  Regular rate and rhythm. Resp:  Normal effort. Speaking easily and comfortably, no accessory muscle usage nor intercostal retractions.  Lungs clear to auscultation. Abd:  No distention.  No tenderness to palpation. Other:  Shortened and externally rotated left lower extremity.  Trace pitting edema which appears chronic.  Pain with any palpation or manipulation particularly of the  proximal left femur.  No obvious knee effusion.   ED Results / Procedures / Treatments   Labs (all labs ordered are listed, but only abnormal results are displayed) Labs Reviewed  BASIC METABOLIC PANEL WITH GFR - Abnormal; Notable for the following components:      Result Value   Glucose, Bld 154 (*)    All other components within normal limits  CBC WITH DIFFERENTIAL/PLATELET  PROTIME-INR  TYPE AND SCREEN     EKG  ED ECG REPORT I, Darleene Dome, the attending physician, personally viewed and interpreted this ECG.  Date: 06/04/2024 EKG Time: 5:57 AM Rate: 72 Rhythm: normal sinus rhythm QRS Axis: normal Intervals: QTc prolonged at 530 ms ST/T Wave abnormalities: normal Narrative Interpretation: no evidence of acute ischemia    RADIOLOGY See ED course for details   PROCEDURES:  Critical Care performed: No  Procedures    IMPRESSION / MDM / ASSESSMENT AND PLAN / ED COURSE  I reviewed the triage vital signs and the nursing notes.  Differential diagnosis includes, but is not limited to, femur fracture, knee injury, pelvic fracture.  Patient's presentation is most consistent with acute presentation with potential threat to life or bodily function.  Labs/studies ordered: Chest x-ray, hip and pelvis x-rays, knee x-rays, type and screen, basic metabolic panel, pro time-INR, CBC with differential, EKG  Interventions/Medications given:  Medications  fentaNYL  (SUBLIMAZE ) injection 50 mcg (50 mcg Intravenous Given 06/04/24 0604)    (Note:  hospital course my include additional interventions and/or labs/studies not listed above.)   I strongly suspect femur fracture, likely proximal.  X-rays pending.  Patient in severe pain, ordered fentanyl  50 mcg IV to allow for pain control for imaging.  Standard blood work pending anticipating probable orthopedic intervention.  Vital signs are stable.  No evidence of intracranial injury at this  time    Clinical Course as of 06/04/24 0723  Sun Jun 04, 2024  0700 DG Hip Unilat W or Wo Pelvis 2-3 Views Left I dependently viewed and interpreted the patient's hip/pelvis and knee x-rays.  Unfortunately she has a spiral fracture of the distal humerus that is periprosthetic given her prior surgeries.  No clear indication of proximal humerus nor pelvic fractures. [CF]  0709 Paging Dr. Edie with orthopedics [CF]  [phone] DG Chest Port 1 View I independently viewed and interpreted the patient's chest x-ray.  No evidence of pneumonia or pulmonary edema.  I also read the radiologist's report, which confirmed no acute findings. [CF]  [id number] I consulted with Dr. Edie with orthopedics.  He evaluated the images and requested a CT of the femur.  He may be able to handle the patient surgically here at [facility] versus transfer to an Ortho trauma facility like [facility].  I have ordered the CT femur and transferred ED care to Dr. Jacolyn to follow-up with Dr. Edie about the appropriate eventual disposition. [CF]    Clinical Course User Index [CF] Gordan Huxley, MD     FINAL CLINICAL IMPRESSION(S) / ED DIAGNOSES   Final diagnoses:  Fall, initial encounter  Closed displaced spiral fracture of shaft of left femur, initial encounter (HCC)     Rx / DC Orders   ED Discharge Orders     None        Note:  This document was prepared using Dragon voice recognition software and may include unintentional dictation errors.   Gordan Huxley, MD 06/04/24 [phone]

## 2024-06-04 NOTE — Anesthesia Postprocedure Evaluation (Signed)
 Anesthesia Post Note  Patient: BELLAGRACE SYLVAN  Procedure(s) Performed: OPEN REDUCTION INTERNAL FIXATION (ORIF) DISTAL FEMUR FRACTURE (Left: Leg Upper)  Patient location during evaluation: PACU Anesthesia Type: General Level of consciousness: awake and alert Pain management: pain level controlled Vital Signs Assessment: post-procedure vital signs reviewed and stable Respiratory status: spontaneous breathing, nonlabored ventilation, respiratory function stable and patient connected to nasal cannula oxygen Cardiovascular status: blood pressure returned to baseline and stable Postop Assessment: no apparent nausea or vomiting Anesthetic complications: no   No notable events documented.   Last Vitals:  Vitals:   06/04/24 1718 06/04/24 1851  BP: 116/62 (!) 110/59  Pulse: 94 78  Resp: 17 16  Temp: (!) 36.4 C (!) 36.3 C  SpO2: 98% 95%    Last Pain:  Vitals:   06/04/24 1718  TempSrc:   PainSc: 0-No pain                 Debby Mines

## 2024-06-04 NOTE — ED Triage Notes (Signed)
 Pt arrived by ems from [facility] living d/t mechanical fall out of bed. Upon examination pt has shortened and rotated left hip with pain at site. Pt fell on to laminate flooring. Hx dementia.   Pt received 1g tylenol  and 4 mg zofran  en route

## 2024-06-04 NOTE — Consult Note (Signed)
 ORTHOPAEDIC CONSULTATION  REQUESTING PHYSICIAN: Jerelene Critchley, MD  Chief Complaint:   Left hip/thigh pain.  History of Present Illness: Debra Patel is an 83 y.o. female with multiple medical problems including a history of breast cancer, early dementia, hypertension, hypothyroidism, osteoporosis, and tachycardia who lives in an assisted living facility.  The patient was in her usual state of health until she fell out of bed early this morning, injuring her left leg.  She has been brought to the emergency room where x-rays have demonstrated a comminuted displaced distal femoral shaft fracture.  The patient is status post a left total knee arthroplasty performed by Dr. Mardee about 5 years ago and is also status post intramedullary nailing of an intertrochanteric left hip fracture by Dr. Leora.  Both the total knee and the intramedullary nail appear to be in satisfactory position and without evidence of loosening.  The patient denies any associated injuries.  She did not strike her head or lose consciousness.  The patient is a poor historian.  Past Medical History:  Diagnosis Date   Arthritis    BP (high blood pressure) 08/21/2014   no current issues   Brain infection 2008   Breast cancer (HCC) 2001   Left side mastectomy   History of kidney stones    h/o   Hypothyroidism    Personal history of chemotherapy 2001   BREAST CA   Personal history of radiation therapy 2001   BREAST CA   Radiation 2001   BREAST CA   S/P chemotherapy, time since greater than 12 weeks 2001   BREAST CA   Tachycardia    Thyroid  disease    Tick fever    Wears contact lenses    Past Surgical History:  Procedure Laterality Date   ABDOMINAL HYSTERECTOMY     BREAST EXCISIONAL BIOPSY Right 2002   EXCISIONAL - NEG   CATARACT EXTRACTION W/PHACO Right 06/19/2019   Procedure: CATARACT EXTRACTION PHACO AND INTRAOCULAR LENS PLACEMENT (IOC) RIGHT  ISTENT  00:56.3  9.0%  5.13;  Surgeon: Myrna Adine Anes, MD;  Location: [facility];  Service: Ophthalmology;  Laterality: Right;  Latex   CATARACT EXTRACTION W/PHACO Left 07/31/2019   Procedure: CATARACT EXTRACTION PHACO AND INTRAOCULAR LENS PLACEMENT (IOC) LEFT ISTENT INJ 00:48.1  12.9%  6.34;  Surgeon: Myrna Adine Anes, MD;  Location: [facility];  Service: Ophthalmology;  Laterality: Left;   INTRAMEDULLARY (IM) NAIL INTERTROCHANTERIC Left 06/29/2018   Procedure: INTRAMEDULLARY (IM) NAIL INTERTROCHANTRIC- TFNA;  Surgeon: Leora Lynwood SAUNDERS, MD;  Location: [facility];  Service: Orthopedics;  Laterality: Left;   KNEE ARTHROPLASTY Left 04/03/2019   Procedure: COMPUTER ASSISTED TOTAL KNEE ARTHROPLASTY;  Surgeon: Mardee Lynwood SQUIBB, MD;  Location: [facility];  Service: Orthopedics;  Laterality: Left;   Mass Removed from Bladder and Both Ovaries     MASTECTOMY Left 2001   BREAST CA   THYROIDECTOMY Left    Social History   Socioeconomic History   Marital status: Widowed    Spouse name: Not on file   Number of children: Not on file   Years of education: Not on file   Highest education level: Not on file  Occupational History   Not on file  Tobacco Use   Smoking status: Never   Smokeless tobacco: Never  Vaping Use   Vaping status: Never Used  Substance and Sexual Activity   Alcohol  use: No   Drug use: No   Sexual activity: Not on file  Other Topics Concern  Not on file  Social History Narrative   Not on file   Social Drivers of Health   Financial Resource Strain: Not on file  Food Insecurity: Not on file  Transportation Needs: No Transportation Needs (12/15/2022)   PRAPARE - Administrator, Civil Service (Medical): No    Lack of Transportation (Non-Medical): No  Physical Activity: Not on file  Stress: Not on file  Social Connections: Not on file   Family History  Problem Relation Age of Onset   Breast cancer Mother 40   Allergies  Allergen Reactions    Alpha-Gal Other (See Comments), Hives and Swelling    Tick bite, sores on body  Diagnosed 10/2017   Pyridoxine Anaphylaxis   Hydrocodone  Nausea Only and Nausea And Vomiting   Bee Venom Other (See Comments)   Motrin  [Ibuprofen ] Other (See Comments)    depression   Oxycodone  Nausea Only   Latex Rash    NEGATIVE Latex IgE (<0.10) on 03/29/2019  (Develops rash/itching from compression stockings and underwear elastic)   Nickel Rash   Nitrofurantoin Itching   Prior to Admission medications   Medication Sig Start Date End Date Taking? Authorizing Provider  acetaminophen  (TYLENOL ) 325 MG tablet Take 650 mg by mouth every 4 (four) hours as needed for mild pain (pain score 1-3). 01/27/23  Yes [provider]  azelastine  (ASTELIN ) 0.1 % nasal spray Place 1 spray into both nostrils daily as needed for rhinitis. Use in each nostril as directed   Yes [provider]  FLUoxetine  (PROZAC ) 10 MG capsule Take 10 mg by mouth daily. 05/20/23  Yes [provider]  loratadine  (CLARITIN ) 10 MG tablet Take 10 mg by mouth daily.   Yes [provider]  memantine  (NAMENDA ) 5 MG tablet Take 5 mg by mouth 2 (two) times daily. 03/29/24  Yes [provider]  senna-docusate (SENOKOT-S) 8.6-50 MG tablet Take 2 tablets by mouth 2 (two) times daily as needed for mild constipation. 01/27/23  Yes [provider]  spironolactone  (ALDACTONE ) 25 MG tablet Take 12.5 mg by mouth in the morning. 03/22/24  Yes [provider]  SYNTHROID  50 MCG tablet Take 50 mcg by mouth daily before breakfast. 08/08/19  Yes [provider]   CT FEMUR LEFT WO CONTRAST Result Date: 06/04/2024 EXAM: CT OF THE LEFT FEMUR, WITHOUT IV CONTRAST 06/04/2024 07:55:43 AM TECHNIQUE: Axial images were acquired through the left femur without IV contrast. Reformatted images were reviewed. Automated exposure control, iterative reconstruction, and/or weight based adjustment of the mA/kV was utilized to  reduce the radiation dose to as low as reasonably achievable. COMPARISON: 11/13/2018 previous left total knee arthroplasty. CLINICAL HISTORY: Upper leg trauma. Pt arrived by ems from [facility] living d/t mechanical fall out of bed. Upon examination pt has shortened and rotated left hip with pain at site. Pt fell on to laminate flooring. Hx dementia. FINDINGS: BONES: Remote healed intertrochanteric fracture of the proximal left femur with im nail and hip screw in place. Status post left knee arthroplasty There is an acute comminuted fracture deformity involving the distal half of the femur with impaction of the fracture fragments by approximately 1.6 cm and mild lateral angulation of the distal fracture fragments. Fracture line extends up to the superior margin of the femoral component of the left knee arthroplasty device where there is anterior displacement of minor fracture fragments, axial image 76/10. JOINTS: No dislocation. The joint spaces are normal. SOFT TISSUES: The soft tissues are unremarkable. Vascular calcifications  noted. LIMITATIONS/ARTIFACTS: Streak artifact from knee prosthesis diminishes exam detail. IMPRESSION: 1. Acute comminuted fracture deformity involving the distal half of the femur with impaction of the fracture fragments by approximately 1.6 cm and mild lateral angulation of the distal fracture fragments. Fracture line extends up to the superior margin of the femoral component of the left knee arthroplasty device where there is anterior displacement of minor fracture fragments. 2. Remote healed intertrochanteric fracture of the proximal left femur with IM nail and hip screw in place. 3. Status post left knee arthroplasty. Electronically signed by: Waddell Calk MD 06/04/2024 08:05 AM EDT RP Workstation: [id number]   DG Hip Unilat W or Wo Pelvis 2-3 Views Left Result Date: 06/04/2024 EXAM: 2 or more VIEW(S) XRAY OF THE LEFT HIP 06/04/2024 06:43:00 AM COMPARISON: 06/29/2018 CLINICAL  HISTORY: Pain and deformity after fall. Pt arrived by EMS from [facility] living d/t mechanical fall out of bed. Upon examination pt has shortened and rotated left hip with pain at site. Pt fell on to laminate flooring. Hx dementia. Pt received 1g Tylenol  and 4 mg Zofran  en route. FINDINGS: BONES AND JOINTS: Healed intertrochanteric fracture of the left hip with intramedullary nail in place. Mild degenerative changes of the left hip. Partially visualized acute fracture deformity involving the distal left femur. SOFT TISSUES: The soft tissues are unremarkable. IMPRESSION: 1. Partially visualized, acute fracture deformity involving the distal left femur. 2. Healed intertrochanteric fracture of the left hip with intramedullary nail in place. 3. Mild degenerative changes of the left hip. Electronically signed by: Waddell Calk MD 06/04/2024 06:54 AM EDT RP Workstation: [id number]   DG Knee 2 Views Left Result Date: 06/04/2024 CLINICAL DATA:  83 year old female status post fall from bed.  Pain. EXAM: LEFT KNEE - 1-2 VIEW COMPARISON:  Postoperative left knee radiographs 04/03/2019. FINDINGS: Two views at 0630 hours. Chronic left total knee arthroplasty, left femur intramedullary rod. Long segment spiral fracture, periprosthetic fracture distal left femoral shaft and metadiaphysis. The fracture appears to terminates at the proximal aspect of the distal femoral condyle implant (lateral view). Mildly displaced butterfly fragments. Maintained knee joint alignment. Patella appears stable and intact. No proximal tibia or fibula fracture identified. IMPRESSION: Positive for long segment spiral, comminuted, periprosthetic fracture of the distal left femoral shaft and metadiaphysis. Underlying chronic total knee arthroplasty appears to remain aligned. Electronically Signed   By: VEAR Hurst M.D.   On: 06/04/2024 06:54   DG Chest Port 1 View Result Date: 06/04/2024 EXAM: 1 VIEW XRAY OF THE CHEST 06/04/2024 06:43:00 AM  COMPARISON: 06/28/2018 CLINICAL HISTORY: Pre-op. Pt arrived by EMS from [facility] living d/t mechanical fall out of bed. Upon examination pt has shortened and rotated left hip with pain at site. Pt fell on to laminate flooring. Hx dementia. Pt received 1g tylenol  and 4 mg zofran  en route. FINDINGS: LUNGS AND PLEURA: No focal pulmonary opacity. No pulmonary edema. No pleural effusion. No pneumothorax. HEART AND MEDIASTINUM: No acute abnormality of the cardiac and mediastinal silhouettes. Atherosclerotic plaque. BONES AND SOFT TISSUES: No acute osseous abnormality. IMPRESSION: 1. No acute process. Electronically signed by: Waddell Calk MD 06/04/2024 06:51 AM EDT RP Workstation: [id number]   Positive ROS: All other systems have been reviewed and were otherwise negative with the exception of those mentioned in the HPI and as above.  Physical Exam: General:  Alert, moderate distress due to pain Psychiatric:  Patient is of questionable competency for consent, but exhibits normal mood and affect   Cardiovascular:  No  pedal edema Respiratory:  No wheezing, non-labored breathing GI:  Abdomen is soft and non-tender Skin:  No lesions in the area of chief complaint Neurologic:  Sensation intact distally Lymphatic:  No axillary or cervical lymphadenopathy  Orthopedic Exam:  Orthopedic examination is limited to the left hip and lower extremity.  The left lower extremity is somewhat shortened and externally rotated as compared to the right.  Skin inspection is notable for well-healed surgical incisions over the lateral aspect of the left hip and anterior aspect of the left knee, as well as for swelling around the distal thigh region, but otherwise is unremarkable.  No erythema, ecchymosis, abrasions, or other skin abnormalities are identified.  X-rays:  Recent x-rays of the left hip and knee, as well as a CT scan of the left femur, are available for review and have been reviewed by myself.  The findings  are as described above.  The prosthetic knee appears to be in good position and without evidence of loosening.  There is some comminution of the distal femur fracture with approximately 3 cm of shortening and some rotational/angular deformity.  Assessment: Closed displaced left distal femoral periprosthetic fracture.  Plan: The treatment options, including both surgical and nonsurgical choices, have been discussed in detail with the patient and her son who is at the bedside.  The patient and her son would like to proceed with surgical intervention to include an open reduction internal fixation of the left distal femur fracture.  The risks (including bleeding, infection, nerve and/or blood vessel injury, persistent or recurrent pain, loosening or failure of the components, leg length inequality, malunion and/or nonunion, need for further surgery, blood clots, strokes, heart attacks or arrhythmias, pneumonia, etc.) and benefits of the surgical procedure were discussed.  The patient and her son state their understanding and agree to proceed.  A formal written consent will be obtained by the nursing staff.  Thank you for asking me to participate in the care of this most pleasant unfortunate woman.  I will be happy to follow her with you.   [name] Reyes Maltos, MD  Beeper #:  [phone]  06/04/2024 11:33 AM

## 2024-06-04 NOTE — ED Provider Notes (Signed)
-----------------------------------------   10:45 AM on 06/04/2024 -----------------------------------------  I took over care of this patient from Dr. Gordan.  I consulted and discussed the case with Dr. Edie from orthopedics here as well as Dr. Germaine from orthopedics at [facility].  Based on discussion amongst the orthopedist, Dr. Edie has recommended admitting the patient to the hospitalist service here for surgery.  I also spoke to the son and updated him on the patient's status and the plan of care.  I consulted Dr. Jerelene from the hospitalist service; based on our discussion she agrees to evaluate the patient for admission.   Jacolyn Pae, MD 06/04/24 1046

## 2024-06-05 ENCOUNTER — Encounter: Payer: Self-pay | Admitting: Surgery

## 2024-06-05 DIAGNOSIS — S7292XA Unspecified fracture of left femur, initial encounter for closed fracture: Secondary | ICD-10-CM

## 2024-06-05 LAB — BASIC METABOLIC PANEL WITH GFR
Anion gap: 9 (ref 5–15)
BUN: 12 mg/dL (ref 8–23)
CO2: 24 mmol/L (ref 22–32)
Calcium: 8.2 mg/dL — ABNORMAL LOW (ref 8.9–10.3)
Chloride: 106 mmol/L (ref 98–111)
Creatinine, Ser: 0.77 mg/dL (ref 0.44–1.00)
GFR, Estimated: >60 mL/min (ref >60)
Glucose, Bld: 139 mg/dL — ABNORMAL HIGH (ref 70–99)
Potassium: 4.1 mmol/L (ref 3.5–5.1)
Sodium: 139 mmol/L (ref 135–145)

## 2024-06-05 LAB — CBC
HCT: 31.1 % — ABNORMAL LOW (ref 36.0–46.0)
Hemoglobin: 10.4 g/dL — ABNORMAL LOW (ref 12.0–15.0)
MCH: 29.4 pg (ref 26.0–34.0)
MCHC: 33.4 g/dL (ref 30.0–36.0)
MCV: 87.9 fL (ref 80.0–100.0)
Platelets: 188 K/uL (ref 150–400)
RBC: 3.54 MIL/uL — ABNORMAL LOW (ref 3.87–5.11)
RDW: 14.2 % (ref 11.5–15.5)
WBC: 13.1 K/uL — ABNORMAL HIGH (ref 4.0–10.5)
nRBC: 0 % (ref 0.0–0.2)

## 2024-06-05 NOTE — Progress Notes (Signed)
 Occupational Therapy Treatment Patient Details Name: Debra Patel MRN: [id number] DOB: July 14, 1941 Today's Date: 06/05/2024   History of present illness Pt is an 83 y/o F admitted on 06/04/24 after presenting with c/o a fall OOB. Pt found to have closed displaced periprosthetic L distal femur fx & underwent ORIF on 06/04/24. PMH: dementia, hypothyroidism, breast CA s/p L side mastectomy   OT comments  Pt seen for OT treatment this date with RN present to assist with functional transfers. Pt requires max multimodal cuing at times for safety due to hx of dementia, and is unable to recall PWB precautions throughout session with hinge brace locked in extension throughout. Pt requires MOD A +2 for STS transfers, cues for hand placement and sequencing to offset weight to RLE, step pivot recliner <> BSC for continent urine void, clothing mgmt and hygiene from lateral leans with setup. Pt left in recliner with RN and son present. Discharge recommendation appropriate, OT will follow acutely.       If plan is discharge home, recommend the following:  A little help with walking and/or transfers;A little help with bathing/dressing/bathroom;Supervision due to cognitive status   Equipment Recommendations  BSC/3in1       Precautions / Restrictions Precautions Precautions: Fall Recall of Precautions/Restrictions: Impaired Required Braces or Orthoses: Knee Immobilizer - Left Knee Immobilizer - Left: On at all times (locked in extension for ambulation, unlocked for ROM during gait) Restrictions Weight Bearing Restrictions Per Provider Order: Yes LLE Weight Bearing Per Provider Order: Partial weight bearing LLE Partial Weight Bearing Percentage or Pounds: 50% Other Position/Activity Restrictions: knee brace locked in extension for gait, unlocked for ROM at rest       Mobility Bed Mobility Overal bed mobility: Needs Assistance             General bed mobility comments: NT, pt in recliner pre and  post sessoin    Transfers Overall transfer level: Needs assistance Equipment used: Rolling walker (2 wheels) Transfers: Sit to/from Stand, Bed to chair/wheelchair/BSC Sit to Stand: +2 safety/equipment, +2 physical assistance, Mod assist     Step pivot transfers: Mod assist     General transfer comment: STS using RW with cues for hand placement, pt internally/externally distracted. transfers with RW with L hinge brace donned. locked in extension for mobility.     Balance Overall balance assessment: Needs assistance Sitting-balance support: Feet supported Sitting balance-Leahy Scale: Fair     Standing balance support: During functional activity, Bilateral upper extremity supported, Reliant on assistive device for balance Standing balance-Leahy Scale: Fair Standing balance comment: recommend hands-on physical assist at all times due to cognition and falls risk                           ADL either performed or assessed with clinical judgement   ADL Overall ADL's : Needs assistance/impaired                         Toilet Transfer: Cueing for sequencing;Cueing for safety;BSC/3in1;Rolling walker (2 wheels);Moderate assistance;+2 for physical assistance;+2 for safety/equipment Toilet Transfer Details (indicate cue type and reason): pt follows some commands with increased time, inconsistently follows commands overall. Pt requires cues for hand placement, poor adherence to PWB on LLE despite cues and edu, and is able to rely heavily on BUE using RW for small pivotal steps recliner <> BSC. cues to offset weight to RLE to kick LLE out in front to  descend. Pt forgetting purpose of transfer halfway through, and attempting to walk forward Toileting- Clothing Manipulation and Hygiene: Sitting/lateral lean;Minimal assistance       Functional mobility during ADLs: Moderate assistance;+2 for physical assistance;+2 for safety/equipment;Cueing for safety;Cueing for  sequencing;Rolling walker (2 wheels) General ADL Comments: +2 for cog and safety with RW, RN present for transfer to assist    Praxis     Communication Communication Communication: Impaired Factors Affecting Communication: Hearing impaired   Cognition Arousal: Alert Behavior During Therapy: WFL for tasks assessed/performed Cognition: History of cognitive impairments             OT - Cognition Comments: poor situational awareness, does not follow commands consistently, high falls risk                 Following commands: Impaired Following commands impaired: Follows one step commands with increased time      Cueing   Cueing Techniques: Verbal cues  Exercises Exercises: Other exercises Other Exercises Other Exercises: Edu to pt on WB precautions with poor carryover, LLE hinge brace locked in extension throughout session, demonstrated to RN how to lock/unlock and don/doff per orders. Brace adjusted.            Pertinent Vitals/ Pain       Pain Assessment Pain Assessment: Faces Faces Pain Scale: Hurts little more Pain Location: LLE Pain Descriptors / Indicators: Discomfort, Dull, Grimacing, Guarding Pain Intervention(s): Limited activity within patient's tolerance, Monitored during session, Repositioned         Frequency  Min 3X/week        Progress Toward Goals  OT Goals(current goals can now be found in the care plan section)  Progress towards OT goals: Progressing toward goals  Acute Rehab OT Goals OT Goal Formulation: With patient/family Time For Goal Achievement: 06/19/24 Potential to Achieve Goals: Good ADL Goals Pt Will Perform Grooming: with modified independence;standing Pt Will Perform Lower Body Dressing: with modified independence;sit to/from stand Pt Will Transfer to Toilet: with modified independence;ambulating;regular height toilet  Plan         AM-PAC OT 6 Clicks Daily Activity     Outcome Measure   Help from another person  eating meals?: None Help from another person taking care of personal grooming?: A Little Help from another person toileting, which includes using toliet, bedpan, or urinal?: A Little Help from another person bathing (including washing, rinsing, drying)?: A Lot Help from another person to put on and taking off regular upper body clothing?: A Little Help from another person to put on and taking off regular lower body clothing?: A Lot 6 Click Score: 17    End of Session Equipment Utilized During Treatment: Rolling walker (2 wheels);Left knee immobilizer (LLE hinge brace locked in ext)  OT Visit Diagnosis: Other abnormalities of gait and mobility (R26.89);Muscle weakness (generalized) (M62.81)   Activity Tolerance Patient tolerated treatment well   Patient Left in chair;with call bell/phone within reach;with chair alarm set;with family/visitor present   Nurse Communication Mobility status        Time: 8563-8550 OT Time Calculation (min): 13 min  Charges: OT General Charges $OT Visit: 1 Visit OT Treatments $Self Care/Home Management : 8-22 mins  Twania Bujak L. Amilia Vandenbrink, OTR/L  06/05/24, 3:11 PM

## 2024-06-05 NOTE — Progress Notes (Signed)
  PROGRESS NOTE    [name]  FMW:[id number] DOB: March 02, 1941 DOA: 06/04/2024 PCP: Steva Clotilda DEL, NP  [id number]  LOS: 1 day   Brief hospital course:   Assessment & Plan: Debra Patel is a 83 y.o. female with medical history significant of dementia, Hypothyroidism was BIBEMS [facility] due to mechanical fall out of bed.  Reportedly had a fall out of bed, onto the laminate flooring.  Did not hit her head.  Has severe pain in the left hip and left lower extremity down to the knee.  LLE shortened and externally rotated, and reports severe pain.    Acute comminuted fracture of the left femur S/p ORIF on 06/04/24 --PT/OT --ALF to evaluate whether ptcan return   Dementia --cont memantine    Hypothyroidism --cont Synthroid    DVT prophylaxis: Lovenox  SQ Code Status: DNR  Family Communication:  Level of care: Med-Surg Dispo:   The patient is from: ALF Anticipated d/c is to: to be determined Anticipated d/c date is: ready tomorrow   Subjective and Interval History:  Pt denied pain.  Pt's speech sounded coherent, but not based on reality.   Objective: Vitals:   06/05/24 0804 06/05/24 0811 06/05/24 1202 06/05/24 1553  BP: (!) 124/59  130/61 (!) 147/77  Pulse: 66  70 75  Resp: 20  16 16   Temp: 98.1 F (36.7 C)  97.9 F (36.6 C) 98 F (36.7 C)  TempSrc:   Oral Oral  SpO2: 98% 97% 98% 99%  Weight:      Height:        Intake/Output Summary (Last 24 hours) at 06/05/2024 1659 Last data filed at 06/05/2024 1500 Gross per 24 hour  Intake 1629.39 ml  Output 1890 ml  Net -260.61 ml   Filed Weights   06/04/24 0523  Weight: 63.5 kg    Examination:   Constitutional: NAD, alert, oriented x1 only HEENT: conjunctivae and lids normal, EOMI CV: No cyanosis.   RESP: normal respiratory effort, on RA Neuro: II - XII grossly intact.     Data Reviewed: I have personally reviewed labs and imaging studies  Time spent: 35 minutes  Ellouise Haber,  MD [facility] If 7PM-7AM, please contact night-coverage 06/05/2024, 4:59 PM

## 2024-06-05 NOTE — Plan of Care (Signed)
   Problem: Health Behavior/Discharge Planning: Goal: Ability to manage health-related needs will improve Outcome: Progressing

## 2024-06-05 NOTE — Progress Notes (Signed)
 VAST consult received to obtain IV access. Pt's left arm restricted d/t previous breast cancer with lymph node removal. Right arm assessed utilizing ultrasound; vessels extremely small and deep. Able to place a 22G 2.5 inch IV in upper right arm; however, no other appropriate vessels noted.

## 2024-06-05 NOTE — Evaluation (Signed)
 Physical Therapy Evaluation Patient Details Name: Debra Patel MRN: [id number] DOB: 1941/09/11 Today's Date: 06/05/2024  History of Present Illness  Pt is an 83 y/o F admitted on 06/04/24 after presenting with c/o a fall OOB. Pt found to have closed displaced periprosthetic L distal femur fx & underwent ORIF on 06/04/24. PMH: dementia, hypothyroidism, breast CA s/p L side mastectomy  Clinical Impression  Pt seen for PT evaluation with co-tx with OT, son present at beginning & end of session. Pt pleasantly confused, current cognitive status comparable to baseline per son. Pt follows simple commands throughout session, easily redirected if distracted. Pt requires min assist +2 fade to +1 for transfers, transferring to [facility] for continent void. Pt ambulates into hallway with RW & min assist with good ability to maintain PWB LLE. Recommend ongoing PT services to progress mobility as able.        If plan is discharge home, recommend the following: A little help with walking and/or transfers;A little help with bathing/dressing/bathroom   Can travel by private vehicle   Yes    Equipment Recommendations Rolling walker (2 wheels);BSC/3in1  Recommendations for Other Services       Functional Status Assessment Patient has had a recent decline in their functional status and demonstrates the ability to make significant improvements in function in a reasonable and predictable amount of time.     Precautions / Restrictions Precautions Precautions: Fall Required Braces or Orthoses:  (L bledsoe brace) Knee Immobilizer - Left: On at all times (locked in extension for ambulation, unlocked for ROM during gait) Restrictions Weight Bearing Restrictions Per Provider Order: Yes LLE Weight Bearing Per Provider Order: Partial weight bearing LLE Partial Weight Bearing Percentage or Pounds: 50% Other Position/Activity Restrictions: knee brace locked in extension for gait, unlocked for ROM at rest       Mobility  Bed Mobility               General bed mobility comments: OT assisted pt with supine>sitting EOB    Transfers Overall transfer level: Needs assistance Equipment used: Rolling walker (2 wheels) Transfers: Sit to/from Stand, Bed to chair/wheelchair/BSC Sit to Stand: Min assist, +2 safety/equipment, +2 physical assistance (min assist +2 fade to +1 assist)   Step pivot transfers: Min assist, +2 physical assistance, +2 safety/equipment (bed>BSC on L with RW, cuing re: hand placement, sequencing, technique)       General transfer comment: Sit>stand from EOB, BSC, recliner, pt able to push to standing with BUE, requires extra time to transition BUE from chair armrests to RW.    Ambulation/Gait Ambulation/Gait assistance: Min assist, +2 safety/equipment, +2 physical assistance Gait Distance (Feet): 40 Feet Assistive device: Rolling walker (2 wheels) Gait Pattern/deviations: Step-to pattern, Decreased step length - left, Decreased step length - right, Decreased dorsiflexion - left, Decreased stride length Gait velocity: decreased     General Gait Details: Pt does well at maintaining PWB LLE 2/2 discomfort & does not attempt to fully weight bear through LLE during stance phase, cuing for forward vs downward gaze.  Stairs            Wheelchair Mobility     Tilt Bed    Modified Rankin (Stroke Patients Only)       Balance Overall balance assessment: Needs assistance Sitting-balance support: Feet supported Sitting balance-Leahy Scale: Fair     Standing balance support: During functional activity, Bilateral upper extremity supported, Reliant on assistive device for balance Standing balance-Leahy Scale: Fair  Pertinent Vitals/Pain Pain Assessment Pain Assessment: PAINAD Breathing: normal Negative Vocalization: occasional moan/groan, low speech, negative/disapproving quality Facial Expression: sad, frightened,  frown Body Language: relaxed Consolability: no need to console PAINAD Score: 2 Pain Intervention(s): Limited activity within patient's tolerance, Monitored during session    Home Living Family/patient expects to be discharged to:: Assisted living                 Home Equipment: None      Prior Function Prior Level of Function : Independent/Modified Independent             Mobility Comments: walks 5 miles/day, ambulatory without AD, did not require physical assistance at baseline ADLs Comments: assist for meds/meals     Extremity/Trunk Assessment   Upper Extremity Assessment Upper Extremity Assessment: Overall WFL for tasks assessed    Lower Extremity Assessment Lower Extremity Assessment: Generalized weakness;LLE deficits/detail LLE Deficits / Details: LLE in brace       Communication   Communication Communication: Impaired Factors Affecting Communication: Hearing impaired    Cognition Arousal: Alert Behavior During Therapy: WFL for tasks assessed/performed   PT - Cognitive impairments: History of cognitive impairments, Orientation, Memory, Awareness, Attention, Problem solving, Sequencing, Safety/Judgement                       PT - Cognition Comments: hx of dementia, pt's cognition is currently unchanged from baseline Following commands: Impaired Following commands impaired: Follows one step commands with increased time     Cueing Cueing Techniques: Verbal cues     General Comments General comments (skin integrity, edema, etc.): SPO2 >90% on room air, HR 132 bpm with gait    Exercises     Assessment/Plan    PT Assessment Patient needs continued PT services  PT Problem List Decreased strength;Pain;Decreased range of motion;Decreased activity tolerance;Decreased balance;Decreased safety awareness;Decreased knowledge of use of DME;Decreased knowledge of precautions;Decreased mobility       PT Treatment Interventions DME  instruction;Balance training;Neuromuscular re-education;Gait training;Functional mobility training;Patient/family education;Therapeutic activities;Therapeutic exercise    PT Goals (Current goals can be found in the Care Plan section)  Acute Rehab PT Goals Patient Stated Goal: get better PT Goal Formulation: With patient/family Time For Goal Achievement: 06/19/24 Potential to Achieve Goals: Good    Frequency 7X/week     Co-evaluation PT/OT/SLP Co-Evaluation/Treatment: Yes Reason for Co-Treatment: For patient/therapist safety;To address functional/ADL transfers PT goals addressed during session: Balance;Mobility/safety with mobility;Proper use of DME         AM-PAC PT 6 Clicks Mobility  Outcome Measure Help needed turning from your back to your side while in a flat bed without using bedrails?: A Little Help needed moving from lying on your back to sitting on the side of a flat bed without using bedrails?: A Little Help needed moving to and from a bed to a chair (including a wheelchair)?: A Little Help needed standing up from a chair using your arms (e.g., wheelchair or bedside chair)?: A Little Help needed to walk in hospital room?: A Little Help needed climbing 3-5 steps with a railing? : Total 6 Click Score: 16    End of Session Equipment Utilized During Treatment:  (LLE bledsoe brace) Activity Tolerance: Patient tolerated treatment well;Patient limited by fatigue Patient left: in chair;with chair alarm set;with call bell/phone within reach;with family/visitor present Nurse Communication: Mobility status;Weight bearing status;Precautions (written on white board in room) PT Visit Diagnosis: Muscle weakness (generalized) (M62.81);Other abnormalities of gait and mobility (R26.89);Difficulty in walking, not  elsewhere classified (R26.2);Unsteadiness on feet (R26.81)    Time: 9053-8990 PT Time Calculation (min) (ACUTE ONLY): 23 min   Charges:   PT Evaluation $PT Eval Moderate  Complexity: 1 Mod   PT General Charges $$ ACUTE PT VISIT: 1 Visit         Richerd Pinal, PT, DPT 06/05/24, 11:01 AM   Richerd CHRISTELLA Pinal 06/05/2024, 11:00 AM

## 2024-06-05 NOTE — Progress Notes (Signed)
  Subjective: 1 Day Post-Op Procedure(s) (LRB): OPEN REDUCTION INTERNAL FIXATION (ORIF) DISTAL FEMUR FRACTURE (Left) Patient reports pain as mild.   Patient is well, and has had no acute complaints or problems PT and care management to assist with discharge planning. Negative for chest pain and shortness of breath Fever: no Gastrointestinal:Negative for nausea and vomiting Reports she is passing some gas this Am.  Objective: Vital signs in last 24 hours: Temp:  [97.3 F (36.3 C)-97.9 F (36.6 C)] 97.8 F (36.6 C) (09/15 0414) Pulse Rate:  [70-97] 72 (09/15 0728) Resp:  [9-20] 19 (09/15 0728) BP: (96-137)/(57-83) 118/60 (09/15 0728) SpO2:  [95 %-100 %] 96 % (09/15 0414)  Intake/Output from previous day:  Intake/Output Summary (Last 24 hours) at 06/05/2024 0747 Last data filed at 06/05/2024 0600 Gross per 24 hour  Intake 2434.39 ml  Output 1300 ml  Net 1134.39 ml    Intake/Output this shift: No intake/output data recorded.  Labs: Recent Labs    06/04/24 0526 06/05/24 0503  HGB 13.2 10.4*   Recent Labs    06/04/24 0526 06/05/24 0503  WBC 7.8 13.1*  RBC 4.57 3.54*  HCT 39.6 31.1*  PLT 233 188   Recent Labs    06/04/24 0526 06/05/24 0503  NA 136 139  K 3.5 4.1  CL 102 106  CO2 22 24  BUN 15 12  CREATININE 0.82 0.77  GLUCOSE 154* 139*  CALCIUM  9.0 8.2*   Recent Labs    06/04/24 0526  INR 1.1     EXAM General - Patient is Alert, Appropriate, and Oriented Extremity - ABD soft Sensation intact distally Dorsiflexion/Plantar flexion intact Dressing/Incision - Knee ROM brace intact to the left leg. No drainage noted.  Leg soft to palpation. Visualized honeycomb without drainage. Motor Function - intact, moving foot and toes well on exam.  Abdomen soft with intact bowel sounds this AM.  Past Medical History:  Diagnosis Date   Arthritis    BP (high blood pressure) 08/21/2014   no current issues   Brain infection 2008   Breast cancer (HCC) 2001    Left side mastectomy   History of kidney stones    h/o   Hypothyroidism    Personal history of chemotherapy 2001   BREAST CA   Personal history of radiation therapy 2001   BREAST CA   Radiation 2001   BREAST CA   S/P chemotherapy, time since greater than 12 weeks 2001   BREAST CA   Tachycardia    Thyroid  disease    Tick fever    Wears contact lenses     Assessment/Plan: 1 Day Post-Op Procedure(s) (LRB): OPEN REDUCTION INTERNAL FIXATION (ORIF) DISTAL FEMUR FRACTURE (Left) Principal Problem:   Femur fracture, left (HCC)  Estimated body mass index is 23.3 kg/m as calculated from the following:   Height as of this encounter: 5' 5 (1.651 m).   Weight as of this encounter: 63.5 kg. Advance diet Up with therapy D/C IV fluids when tolerating po intake.  Labs and vitals reviewed. WBC 13.1 this AM, Hg 10.4. Up with therapy today. Continue to work on a BM. Was at assisted living prior to fall, will need SNF at discharge.  DVT Prophylaxis - Lovenox  50% WB to the left leg, locked in extension for ambulation. Can unlock knee brace to work on knee flexion when non-ambulatory.  [name] Gustavo Level, PA-C [facility] Orthopaedic Surgery 06/05/2024, 7:47 AM

## 2024-06-05 NOTE — Plan of Care (Signed)
   Problem: Clinical Measurements: Goal: Ability to maintain clinical measurements within normal limits will improve Outcome: Progressing

## 2024-06-05 NOTE — Evaluation (Signed)
 Occupational Therapy Evaluation Patient Details Name: Debra Patel MRN: [id number] DOB: Feb 17, 1941 Today's Date: 06/05/2024   History of Present Illness   Pt is an 83 y/o F admitted on 06/04/24 after presenting with c/o a fall OOB. Pt found to have closed displaced periprosthetic L distal femur fx & underwent ORIF on 06/04/24. PMH: dementia, hypothyroidism, breast CA s/p L side mastectomy   Clinical Impressions Debra Patel was seen for OT evaluation this date. Prior to hospital admission, pt was IND walking up to 5 miles daily at [facility]. Pt required assist for meals/meds only. Pt currently requires MIN A exit bed and MIN A + RW for bed>BSC t/f, +2 for lines/cognition. SETUP + SBA pericare sitting. Pt requires cues for RW use, improves with time. MIN A + RW for ADL t/f ~40 ft. Pt would benefit from skilled OT to address noted impairments and functional limitations (see below for any additional details). Upon hospital discharge, recommend OT follow up <3 hours/day. Pt may benefit from return to familiar environment with SUPERVISION for cognition and assist for ADLs.     If plan is discharge home, recommend the following:   A little help with walking and/or transfers;A little help with bathing/dressing/bathroom;Supervision due to cognitive status     Functional Status Assessment   Patient has had a recent decline in their functional status and demonstrates the ability to make significant improvements in function in a reasonable and predictable amount of time.     Equipment Recommendations   BSC/3in1     Recommendations for Other Services         Precautions/Restrictions   Precautions Precautions: Fall Recall of Precautions/Restrictions: Impaired Required Braces or Orthoses: Knee Immobilizer - Left Knee Immobilizer - Left: On at all times (locked in extension with ambulation) Restrictions Weight Bearing Restrictions Per Provider Order: Yes LLE Weight Bearing Per  Provider Order: Partial weight bearing LLE Partial Weight Bearing Percentage or Pounds: 50% Other Position/Activity Restrictions: knee brace locked in extension     Mobility Bed Mobility Overal bed mobility: Needs Assistance Bed Mobility: Supine to Sit     Supine to sit: Min assist     General bed mobility comments: assist for LLE    Transfers Overall transfer level: Needs assistance Equipment used: Rolling walker (2 wheels) Transfers: Sit to/from Stand Sit to Stand: Min assist, +2 safety/equipment                  Balance Overall balance assessment: Needs assistance Sitting-balance support: No upper extremity supported, Feet supported Sitting balance-Leahy Scale: Good     Standing balance support: Bilateral upper extremity supported Standing balance-Leahy Scale: Fair                             ADL either performed or assessed with clinical judgement   ADL Overall ADL's : Needs assistance/impaired                                       General ADL Comments: MIN A + RW for ADL t/f ~40 ft. SETUP + SBA pericare sitting.      Pertinent Vitals/Pain Pain Assessment Pain Assessment: PAINAD Breathing: normal Negative Vocalization: none Facial Expression: sad, frightened, frown Body Language: relaxed Consolability: no need to console PAINAD Score: 1 Pain Intervention(s): Limited activity within patient's tolerance     Extremity/Trunk Assessment  Upper Extremity Assessment Upper Extremity Assessment: Overall WFL for tasks assessed   Lower Extremity Assessment Lower Extremity Assessment: Generalized weakness;LLE deficits/detail LLE Deficits / Details: LLE in brace       Communication Communication Communication: Impaired Factors Affecting Communication: Hearing impaired   Cognition Arousal: Alert Behavior During Therapy: WFL for tasks assessed/performed Cognition: History of cognitive impairments             OT -  Cognition Comments: oriented to self and month only. Does not recall fall/surgery or location as hospital despite repeated reminders                 Following commands: Intact                  Home Living Family/patient expects to be discharged to:: Assisted living                             Home Equipment: None          Prior Functioning/Environment Prior Level of Function : Independent/Modified Independent             Mobility Comments: walks 5 miles/day, ambulatory without AD, did not require physical assistance at baseline ADLs Comments: assist for meds/meals    OT Problem List: Decreased strength;Decreased range of motion;Decreased activity tolerance;Impaired balance (sitting and/or standing);Decreased safety awareness   OT Treatment/Interventions: Self-care/ADL training;Therapeutic exercise;Energy conservation;DME and/or AE instruction;Therapeutic activities;Balance training;Patient/family education      OT Goals(Current goals can be found in the care plan section)   Acute Rehab OT Goals Patient Stated Goal: to go home OT Goal Formulation: With patient/family Time For Goal Achievement: 06/19/24 Potential to Achieve Goals: Good ADL Goals Pt Will Perform Grooming: with modified independence;standing Pt Will Perform Lower Body Dressing: with modified independence;sit to/from stand Pt Will Transfer to Toilet: with modified independence;ambulating;regular height toilet   OT Frequency:  Min 3X/week    Co-evaluation   Reason for Co-Treatment: For patient/therapist safety;To address functional/ADL transfers PT goals addressed during session: Balance;Mobility/safety with mobility;Proper use of DME        AM-PAC OT 6 Clicks Daily Activity     Outcome Measure Help from another person eating meals?: None Help from another person taking care of personal grooming?: A Little Help from another person toileting, which includes using toliet,  bedpan, or urinal?: A Little Help from another person bathing (including washing, rinsing, drying)?: A Lot Help from another person to put on and taking off regular upper body clothing?: A Little Help from another person to put on and taking off regular lower body clothing?: A Lot 6 Click Score: 17   End of Session Equipment Utilized During Treatment: Rolling walker (2 wheels) Nurse Communication: Mobility status  Activity Tolerance: Patient tolerated treatment well Patient left: in chair;with call bell/phone within reach;with chair alarm set;with family/visitor present  OT Visit Diagnosis: Other abnormalities of gait and mobility (R26.89);Muscle weakness (generalized) (M62.81)                Time: 9057-8990 OT Time Calculation (min): 27 min Charges:  OT General Charges $OT Visit: 1 Visit OT Evaluation $OT Eval Low Complexity: 1 Low OT Treatments $Self Care/Home Management : 8-22 mins  Elston Slot, M.S. OTR/L  06/05/24, 12:34 PM  ascom [phone]

## 2024-06-06 DIAGNOSIS — S7292XA Unspecified fracture of left femur, initial encounter for closed fracture: Secondary | ICD-10-CM | POA: Diagnosis not present

## 2024-06-06 MED ORDER — HALOPERIDOL LACTATE 5 MG/ML IJ SOLN
1.0000 mg | Freq: Four times a day (QID) | INTRAMUSCULAR | Status: DC | PRN
  Administered 2024-06-06: 1 mg via INTRAVENOUS
  Filled 2024-06-06: qty 1

## 2024-06-06 MED ORDER — HALOPERIDOL LACTATE 5 MG/ML IJ SOLN: 2.0000 mg | Freq: Four times a day (QID) | INTRAMUSCULAR | Status: DC | PRN

## 2024-06-06 MED ORDER — QUETIAPINE FUMARATE 25 MG PO TABS
50.0000 mg | ORAL_TABLET | Freq: Every day | ORAL | Status: DC
  Administered 2024-06-06 – 2024-06-09 (×4): 50 mg via ORAL
  Filled 2024-06-06 (×5): qty 2

## 2024-06-06 NOTE — Progress Notes (Signed)
 Subjective: 2 Days Post-Op Procedure(s) (LRB): OPEN REDUCTION INTERNAL FIXATION (ORIF) DISTAL FEMUR FRACTURE (Left) Patient reports pain as moderate, more pain after PT yesterday.  Patient is well this morning but was very confused last night and this AM. Attempted to remove brace. PT and care management to assist with discharge planning, will need SNF at discharge. Negative for chest pain and shortness of breath Fever: no Gastrointestinal:Negative for nausea and vomiting Reports she is passing some gas this Am.  Is urinating well without issue.  Objective: Vital signs in last 24 hours: Temp:  [97.8 F (36.6 C)-98.4 F (36.9 C)] 98.4 F (36.9 C) (09/16 0220) Pulse Rate:  [66-108] 108 (09/16 0220) Resp:  [16-20] 20 (09/16 0220) BP: (112-148)/(49-77) 148/67 (09/16 0220) SpO2:  [95 %-99 %] 98 % (09/16 0220)  Intake/Output from previous day:  Intake/Output Summary (Last 24 hours) at 06/06/2024 0733 Last data filed at 06/06/2024 0458 Gross per 24 hour  Intake 520 ml  Output 740 ml  Net -220 ml    Intake/Output this shift: No intake/output data recorded.  Labs: Recent Labs    06/04/24 0526 06/05/24 0503  HGB 13.2 10.4*   Recent Labs    06/04/24 0526 06/05/24 0503  WBC 7.8 13.1*  RBC 4.57 3.54*  HCT 39.6 31.1*  PLT 233 188   Recent Labs    06/04/24 0526 06/05/24 0503  NA 136 139  K 3.5 4.1  CL 102 106  CO2 22 24  BUN 15 12  CREATININE 0.82 0.77  GLUCOSE 154* 139*  CALCIUM  9.0 8.2*   Recent Labs    06/04/24 0526  INR 1.1     EXAM General - Patient is alert but slightly confused this AM.  Improved compared to overnight report. Extremity - ABD soft Sensation intact distally Dorsiflexion/Plantar flexion intact Dressing/Incision - Knee ROM brace intact to the left leg. No drainage noted.  Leg soft to palpation. Visualized honeycomb without drainage. Motor Function - intact, moving foot and toes well on exam.  Abdomen soft with intact bowel sounds  this AM.  Past Medical History:  Diagnosis Date   Arthritis    BP (high blood pressure) 08/21/2014   no current issues   Brain infection 2008   Breast cancer (HCC) 2001   Left side mastectomy   History of kidney stones    h/o   Hypothyroidism    Personal history of chemotherapy 2001   BREAST CA   Personal history of radiation therapy 2001   BREAST CA   Radiation 2001   BREAST CA   S/P chemotherapy, time since greater than 12 weeks 2001   BREAST CA   Tachycardia    Thyroid  disease    Tick fever    Wears contact lenses     Assessment/Plan: 2 Days Post-Op Procedure(s) (LRB): OPEN REDUCTION INTERNAL FIXATION (ORIF) DISTAL FEMUR FRACTURE (Left) Principal Problem:   Femur fracture, left (HCC)  Estimated body mass index is 23.3 kg/m as calculated from the following:   Height as of this encounter: 5' 5 (1.651 m).   Weight as of this encounter: 63.5 kg. Advance diet Up with therapy D/C IV fluids when tolerating po intake.  Vitals reviewed, HR 108, denies any chest pain or SOB. Labs pending. Up with therapy today.  Was able to walk 40 feet with therapy yesterday. Continue to work on a BM. Was at assisted living prior to fall, will need SNF at discharge.  DVT Prophylaxis - Lovenox  50% WB to the left leg,  locked in extension for ambulation. Can unlock knee brace to work on knee flexion when non-ambulatory.  [name] Gustavo Level, PA-C [facility] Orthopaedic Surgery 06/06/2024, 7:33 AM

## 2024-06-06 NOTE — TOC Initial Note (Signed)
 Transition of Care [facility]) - Initial/Assessment Note    Patient Details  Name: Debra Patel MRN: [id number] Date of Birth: 1940-11-03  Transition of Care [facility]) CM/SW Contact:    [name], LCSW Phone Number: 06/06/2024, 1:54 PM  Clinical Narrative:     Patient from [facility]. LCSWA reached out to [facility] to see if patient can admit back after being medically ready. [facility] admissions stated that the patient will need STR before coming back to the facility. LCSWA to fax out information to SNF's in [place]. LCSWA will present Facilities to patient and family at the bedside.  TOC to follow for discharge                    Patient Goals and CMS Choice            Expected Discharge Plan and Services                                              Prior Living Arrangements/Services                       Activities of Daily Living      Permission Sought/Granted                  Emotional Assessment              Admission diagnosis:  Femur fracture, left (HCC) [S72.92XA] Fall, initial encounter [W19.XXXA] Closed displaced spiral fracture of shaft of left femur, initial encounter [facility]) [S72.342A] Patient Active Problem List   Diagnosis Date Noted   Femur fracture, left (HCC) 06/04/2024   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   Nephrolithiasis 02/23/2019   Vertigo 10/09/2018   Fracture of neck of femur (HCC) 09/05/2018   Syncope 06/29/2018   Allergy to alpha-gal 12/16/2017   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   GERD (gastroesophageal reflux disease) 04/13/2017   Constipation, chronic 03/05/2016   Fever 03/04/2015   Febrile 03/04/2015   Chest pain with low risk for cardiac etiology 02/28/2015   History of pulmonary embolism 01/20/2015   Anxiety 10/23/2014   Amnesia 10/23/2014   Mild cognitive disorder 10/23/2014   Hypertension 08/21/2014   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 [place] [address] Phone: [phone] Fax: [phone]  [facility] [address] [place] - [address] [place] [address] Phone: [phone] Fax: [phone]  [id number] [place], [place] - [address] [place] [address] Phone: [phone] Fax: [phone]     Social Drivers of Health (SDOH) Social History: SDOH Screenings   Food Insecurity: Patient Unable To Answer (06/04/2024)  Housing: Patient Unable To Answer (06/04/2024)  Transportation Needs: Patient Unable To Answer (06/04/2024)  Utilities: Patient Unable To Answer (06/04/2024)  Social Connections: Patient Unable To Answer (06/04/2024)  Tobacco Use: Low Risk  (06/04/2024)   SDOH Interventions:     Readmission Risk Interventions     No data to display

## 2024-06-06 NOTE — Progress Notes (Signed)
 Physical Therapy Treatment Patient Details Name: Debra Patel MRN: [id number] DOB: 1941/04/21 Today's Date: 06/06/2024   History of Present Illness Pt is an 83 y/o F admitted on 06/04/24 after presenting with c/o a fall OOB. Pt found to have closed displaced periprosthetic L distal femur fx & underwent ORIF on 06/04/24. PMH: dementia, hypothyroidism, breast CA s/p L side mastectomy    PT Comments  Pt was asleep, supine in bed upon arrival. She easily awakes and is agreeable to session. Pt has baseline cognition deficits however was able to follow commands throughout. Reviewed PWB restrictions and importance of adhering to [facility] for healing. Knee brace was locked in extension for all OOB activity. Pt required +1 assistance to safely exit bed, stand to RW and tolerance ambulation short distances. Vcing throughout for proper wt bearing and increased BUE support during RLE advancement. Pt is far from her baseline abilities. DC recs remain appropriate to maximize independence and safety with all ADLs.    If plan is discharge home, recommend the following: A little help with walking and/or transfers;A little help with bathing/dressing/bathroom     Equipment Recommendations  Rolling walker (2 wheels);BSC/3in1       Precautions / Restrictions Precautions Precautions: Fall Recall of Precautions/Restrictions: Impaired Required Braces or Orthoses: Knee Immobilizer - Left Knee Immobilizer - Left: On at all times (ROM knee brace locked in extension) Restrictions Weight Bearing Restrictions Per Provider Order: Yes LLE Weight Bearing Per Provider Order: Partial weight bearing LLE Partial Weight Bearing Percentage or Pounds: 50% Other Position/Activity Restrictions: knee brace locked in extension for gait, unlocked for ROM at rest     Mobility  Bed Mobility Overal bed mobility: Needs Assistance Bed Mobility: Supine to Sit, Sit to Supine  Supine to sit: Min assist, Mod assist, Used rails, HOB  elevated Sit to supine: Max assist, Used rails, HOB elevated General bed mobility comments: pt requires less assistance to exit bed than it does to return to bed after OOB activity. Extensive +1 assistance for both getting out/in bed    Transfers Overall transfer level: Needs assistance Equipment used: Rolling walker (2 wheels) Transfers: Sit to/from Stand Sit to Stand: Mod assist, From elevated surface  General transfer comment: pt was able to stand and sit from slightly elevated bed height with mod assist of one    Ambulation/Gait Ambulation/Gait assistance: Min assist, Contact guard assist Gait Distance (Feet): 20 Feet Assistive device: Rolling walker (2 wheels) Gait Pattern/deviations: Step-to pattern, Decreased step length - left, Decreased step length - right, Decreased dorsiflexion - left, Decreased stride length Gait velocity: decreased  General Gait Details: Pt was able to ambulate short distance without LOB however does endorse more severe pain in wt bearing. vcs throughout for increased UE wt bearing during RLE advancement    Balance Overall balance assessment: Needs assistance Sitting-balance support: Feet supported Sitting balance-Leahy Scale: Good     Standing balance support: Bilateral upper extremity supported, During functional activity, Reliant on assistive device for balance Standing balance-Leahy Scale: Fair       Hotel manager: No apparent difficulties  Cognition Arousal: Alert Behavior During Therapy: WFL for tasks assessed/performed   PT - Cognitive impairments: History of cognitive impairments    PT - Cognition Comments: hx of dementia, sitter in place due to previous nights events Following commands: Intact      Cueing Cueing Techniques: Verbal cues, Tactile cues     General Comments General comments (skin integrity, edema, etc.): Discussed post acute care needs. Dc  recs remain appropriate      Pertinent  Vitals/Pain Pain Assessment Pain Assessment: 0-10 Pain Score: 9  Pain Location: LLE Pain Descriptors / Indicators: Discomfort, Dull, Grimacing, Guarding Pain Intervention(s): Limited activity within patient's tolerance, Monitored during session, Premedicated before session, Repositioned     PT Goals (current goals can now be found in the care plan section) Acute Rehab PT Goals Patient Stated Goal: get better Progress towards PT goals: Progressing toward goals    Frequency    7X/week           Co-evaluation     PT goals addressed during session: Mobility/safety with mobility;Balance;Proper use of DME;Strengthening/ROM        AM-PAC PT 6 Clicks Mobility   Outcome Measure  Help needed turning from your back to your side while in a flat bed without using bedrails?: A Little Help needed moving from lying on your back to sitting on the side of a flat bed without using bedrails?: A Lot Help needed moving to and from a bed to a chair (including a wheelchair)?: A Lot Help needed standing up from a chair using your arms (e.g., wheelchair or bedside chair)?: A Lot Help needed to walk in hospital room?: A Lot Help needed climbing 3-5 steps with a railing? : A Lot 6 Click Score: 13    End of Session   Activity Tolerance: Patient tolerated treatment well;Patient limited by pain;Patient limited by fatigue Patient left: in bed;with call bell/phone within reach;with bed alarm set;with nursing/sitter in room Nurse Communication: Mobility status;Weight bearing status;Precautions PT Visit Diagnosis: Muscle weakness (generalized) (M62.81);Other abnormalities of gait and mobility (R26.89);Difficulty in walking, not elsewhere classified (R26.2);Unsteadiness on feet (R26.81)     Time: 8653-8597 PT Time Calculation (min) (ACUTE ONLY): 16 min  Charges:    $Gait Training: 8-22 mins PT General Charges $$ ACUTE PT VISIT: 1 Visit                    Rankin Essex PTA 06/06/24, 2:20  PM

## 2024-06-06 NOTE — Progress Notes (Signed)
  PROGRESS NOTE    Debra Patel  FMW:[id number] DOB: [birth year] DOA: 06/04/2024 PCP: Steva Clotilda DEL, NP  [id number]  LOS: 2 days   Brief hospital course:   Assessment & Plan: Debra Patel is a 83 y.o. female with medical history significant of dementia, Hypothyroidism was BIBEMS from [facility] assisted living due to mechanical fall out of bed.    Reportedly had a fall out of bed, onto the laminate flooring.  Did not hit her head.  Has severe pain in the left hip and left lower extremity down to the knee.  LLE shortened and externally rotated, and reported severe pain.    Acute comminuted fracture of the left femur S/p ORIF on 06/04/24 --PT/OT --per ALF, pt will need STR before returning    Dementia --speaks coherently, but not based on reality.  Oriented x1 only. --cont memantine   Hospital delirium --pt became more confused and agitated at night. --start seroquel  50 mg nightly --IV haldol  PRN   Hypothyroidism --cont Synthroid    DVT prophylaxis: Lovenox  SQ Code Status: DNR  Family Communication: daughter updated at bedside today Level of care: Med-Surg Dispo:   The patient is from: ALF Anticipated d/c is to: SNF rehab Anticipated d/c date is: whenever SNF accepts   Subjective and Interval History:  Per nursing, pt was agitated and aggressive last night.  Today during rounds, pt appeared calm, speech sounded normal and coherent, though not base on reality.  Did mention her hip hurt.   Objective: Vitals:   06/05/24 2027 06/06/24 0220 06/06/24 0857 06/06/24 1736  BP: (!) 112/49 (!) 148/67 128/64 (!) 125/59  Pulse: 91 (!) 108 88 95  Resp: 17 20 16 18   Temp: 97.8 F (36.6 C) 98.4 F (36.9 C) 98.6 F (37 C) 98.3 F (36.8 C)  TempSrc:  Oral Oral Oral  SpO2: 95% 98% 92% 95%  Weight:      Height:        Intake/Output Summary (Last 24 hours) at 06/06/2024 1841 Last data filed at 06/06/2024 1300 Gross per 24 hour  Intake 200 ml  Output --  Net 200  ml   Filed Weights   06/04/24 0523  Weight: 63.5 kg    Examination:   Constitutional: NAD, alert, oriented to person only, even though speech sounded coherent HEENT: conjunctivae and lids normal, EOMI CV: No cyanosis.   RESP: normal respiratory effort, on RA Neuro: II - XII grossly intact.     Data Reviewed: I have personally reviewed labs and imaging studies  Time spent: 35 minutes  Ellouise Haber, MD [facility] If 7PM-7AM, please contact night-coverage 06/06/2024, 6:41 PM

## 2024-06-06 NOTE — NC FL2 (Signed)
 [place]  MEDICAID [id number] LEVEL OF CARE FORM     IDENTIFICATION  Patient Name: Debra Patel Birthdate: 1941/01/14 Sex: female Admission Date (Current Location): 06/04/2024  [facility] and [place] Number:  Chiropodist and Address:  [facility], [address], [place], [place] [address]      Provider Number: [id number]  Attending Physician Name and Address:  Awanda City, MD  Relative Name and Phone Number:       Current Level of Care: Hospital Recommended Level of Care: Skilled Nursing Facility Prior Approval Number:    Date Approved/Denied:   PASRR Number:    Discharge Plan: SNF    Current Diagnoses: Patient Active Problem List   Diagnosis Date Noted   Femur fracture, left (HCC) 06/04/2024   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   Nephrolithiasis 02/23/2019   Vertigo 10/09/2018   Fracture of neck of femur (HCC) 09/05/2018   Syncope 06/29/2018   Allergy to alpha-gal 12/16/2017   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   GERD (gastroesophageal reflux disease) 04/13/2017   Constipation, chronic 03/05/2016   Fever 03/04/2015   Febrile 03/04/2015   Chest pain with low risk for cardiac etiology 02/28/2015   History of pulmonary embolism 01/20/2015   Anxiety 10/23/2014   Amnesia 10/23/2014   Mild cognitive disorder 10/23/2014   Hypertension 08/21/2014   Primary osteoarthritis of left knee 04/06/2014   Adult hypothyroidism 11/24/2013   Palpitations 11/24/2013   Mass of pelvis 11/15/2013   Bad memory 07/27/2012   Complete rupture of rotator cuff 10/26/2011   Breast cancer, left (HCC) 04/21/2000    Orientation RESPIRATION BLADDER Height & Weight     Self  Normal Incontinent Weight: 140 lb (63.5 kg) Height:  5' 5 (165.1 cm)  BEHAVIORAL SYMPTOMS/MOOD NEUROLOGICAL BOWEL NUTRITION STATUS      Incontinent Diet (Heart Healthy)  AMBULATORY STATUS COMMUNICATION OF NEEDS Skin   Limited Assist Verbally Surgical wounds (Surgical Closed incision  left hip)                       Personal Care Assistance Level of Assistance  Bathing, Feeding, Dressing Bathing Assistance: Limited assistance Feeding assistance: Independent Dressing Assistance: Limited assistance     Functional Limitations Info  Sight, Hearing, Speech Sight Info: Adequate Hearing Info: Adequate Speech Info: Adequate    SPECIAL CARE FACTORS FREQUENCY  PT (By licensed PT), OT (By licensed OT)     PT Frequency: 5x/week OT Frequency: 5x/week            Contractures      Additional Factors Info  Code Status, Allergies Code Status Info: DNR Limited Allergies Info: Alpha-gal, Pyridoxine, Hydrocodone , Bee Venom, Motrin  (Ibuprofen ), Oxycodone , Latex, Nickel, Nitrofurantoin           Current Medications (06/06/2024):  This is the current hospital active medication list Current Facility-Administered Medications  Medication Dose Route Frequency Provider Last Rate Last Admin   acetaminophen  (TYLENOL ) tablet 325-650 mg  325-650 mg Oral Q6H PRN Poggi, John J, MD   650 mg at 06/06/24 0455   azelastine  (ASTELIN ) 0.1 % nasal spray 1 spray  1 spray Each Nare Daily PRN Poggi, Norleen PARAS, MD       bisacodyl  (DULCOLAX) suppository 10 mg  10 mg Rectal Daily PRN Poggi, John J, MD       diphenhydrAMINE  (BENADRYL ) 12.5 MG/5ML elixir 12.5-25 mg  12.5-25 mg Oral Q4H PRN Poggi, John J, MD       docusate sodium  (COLACE) capsule  100 mg  100 mg Oral BID Poggi, John J, MD   100 mg at 06/06/24 9090   enoxaparin  (LOVENOX ) injection 40 mg  40 mg Subcutaneous Q24H Poggi, John J, MD   40 mg at 06/06/24 9090   FLUoxetine  (PROZAC ) capsule 10 mg  10 mg Oral Daily Poggi, John J, MD   10 mg at 06/06/24 9091   haloperidol  lactate (HALDOL ) injection 1 mg  1 mg Intravenous Q6H PRN Duncan, Hazel V, MD   1 mg at 06/06/24 0245   levothyroxine  (SYNTHROID ) tablet 50 mcg  50 mcg Oral QAC breakfast Poggi, John J, MD   50 mcg at 06/06/24 0458   loratadine  (CLARITIN ) tablet 10 mg  10 mg Oral QHS  Poggi, John J, MD   10 mg at 06/05/24 2052   magnesium  hydroxide (MILK OF MAGNESIA) suspension 30 mL  30 mL Oral Daily PRN Poggi, John J, MD       memantine  (NAMENDA ) tablet 5 mg  5 mg Oral BID Poggi, John J, MD   5 mg at 06/06/24 9091   metoCLOPramide  (REGLAN ) tablet 5-10 mg  5-10 mg Oral Q8H PRN Poggi, John J, MD       Or   metoCLOPramide  (REGLAN ) injection 5-10 mg  5-10 mg Intravenous Q8H PRN Poggi, John J, MD   5 mg at 06/06/24 0248   ondansetron  (ZOFRAN ) tablet 4 mg  4 mg Oral Q6H PRN Poggi, John J, MD       Or   ondansetron  (ZOFRAN ) injection 4 mg  4 mg Intravenous Q6H PRN Poggi, John J, MD   4 mg at 06/06/24 0134   oxyCODONE  (Oxy IR/ROXICODONE ) immediate release tablet 5-10 mg  5-10 mg Oral Q4H PRN Poggi, John J, MD   10 mg at 06/06/24 0018   sodium phosphate  (FLEET) enema 1 enema  1 enema Rectal Once PRN Poggi, Norleen PARAS, MD       spironolactone  (ALDACTONE ) tablet 12.5 mg  12.5 mg Oral q AM Poggi, John J, MD   12.5 mg at 06/06/24 0908     Discharge Medications: Please see discharge summary for a list of discharge medications.  Relevant Imaging Results:  Relevant Lab Results:   Additional Information SSN: [id number]  [name], LCSW

## 2024-06-06 NOTE — Progress Notes (Addendum)
 Pt is confused, will not stay in the bed, attempted to remove the brace on her leg and she has hit the NT. I requested soft non-violent restraints. Dr. Cleatus placed the order. I told the pt I was going to put her in restraints to keep her safe. She said she was going to call the police to report me. There are not restraints on 1 A  or 1 C. At 0140, the pt went to sleep. I will hold off on the restraints for now.  Pt woke up at 0200 attempting to get out of the bed again. She was angry that we would not allow her to get out of the bed. 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 I asked if we could pray together. She said yes. She then laid back down. Pt sat up and demanded we let her leave. She hit the NT again. I attempted to call her daughter [name] but her phone went to voice mail. [place], [facility] suggested that the pt be medicated instead of restraints. Dr. Cleatus discontinued the restraints and put in an order for Haldol .

## 2024-06-07 DIAGNOSIS — S7292XA Unspecified fracture of left femur, initial encounter for closed fracture: Secondary | ICD-10-CM | POA: Diagnosis not present

## 2024-06-07 MED ORDER — FE FUM-VIT C-VIT B12-FA 460-60-0.01-1 MG PO CAPS
1.0000 | ORAL_CAPSULE | Freq: Every day | ORAL | Status: DC
  Administered 2024-06-07 – 2024-06-09 (×3): 1 via ORAL
  Filled 2024-06-07 (×4): qty 1

## 2024-06-07 MED ORDER — ENOXAPARIN SODIUM 40 MG/0.4ML IJ SOSY: 40.0000 mg | PREFILLED_SYRINGE | INTRAMUSCULAR | 0 refills | Status: DC

## 2024-06-07 MED ORDER — MAGNESIUM HYDROXIDE 400 MG/5ML PO SUSP
30.0000 mL | Freq: Every day | ORAL | Status: DC
  Administered 2024-06-07 – 2024-06-08 (×2): 30 mL via ORAL
  Filled 2024-06-07 (×3): qty 30

## 2024-06-07 MED ORDER — OXYCODONE HCL 5 MG PO TABS: 5.0000 mg | ORAL_TABLET | ORAL | 0 refills | Status: DC | PRN

## 2024-06-07 MED ORDER — SENNA 8.6 MG PO TABS
2.0000 | ORAL_TABLET | Freq: Every day | ORAL | Status: DC
  Administered 2024-06-07 – 2024-06-09 (×3): 17.2 mg via ORAL
  Filled 2024-06-07 (×3): qty 2

## 2024-06-07 NOTE — Care Management Important Message (Signed)
 Important Message  Patient Details  Name: Debra Patel MRN: [id number] Date of Birth: 07/20/1941   Important Message Given:  Yes - Medicare IM     Caleigh Rabelo W, CMA 06/07/2024, 11:45 AM

## 2024-06-07 NOTE — Plan of Care (Signed)
   Problem: Health Behavior/Discharge Planning: Goal: Ability to manage health-related needs will improve Outcome: Progressing

## 2024-06-07 NOTE — Discharge Instructions (Signed)
 Diet: As you were doing prior to hospitalization   Shower:  May shower but keep the wounds dry, use an occlusive plastic wrap, NO SOAKING IN TUB.  If the bandage gets wet, change with a clean dry gauze.  Dressing:  You may change your dressing as needed. Change the dressing with sterile gauze dressing.    Activity:  Increase activity slowly as tolerated, but follow the weight bearing instructions below.  No lifting or driving for 6 weeks.  Weight Bearing:   50% weightbearing to the left leg with the knee brace locked in extension.  Can unlock and work on flexion when non-ambulatory.  To prevent constipation: you may use a stool softener such as -  Colace (over the counter) 100 mg by mouth twice a day  Drink plenty of fluids (prune juice may be helpful) and high fiber foods Miralax  (over the counter) for constipation as needed.    Itching:  If you experience itching with your medications, try taking only a single pain pill, or even half a pain pill at a time.  You may take up to 10 pain pills per day, and you can also use benadryl  over the counter for itching or also to help with sleep.   Precautions:  If you experience chest pain or shortness of breath - call 911 immediately for transfer to the hospital emergency department!!  If you develop a fever greater that 101 F, purulent drainage from wound, increased redness or drainage from wound, or calf pain-Call [facility]                                              Follow- Up Appointment:  Please call for an appointment to be seen in 2 weeks at [facility]

## 2024-06-07 NOTE — Progress Notes (Signed)
 Subjective: 3 Days Post-Op Procedure(s) (LRB): OPEN REDUCTION INTERNAL FIXATION (ORIF) DISTAL FEMUR FRACTURE (Left) Patient reports pain as mild in the left leg this AM. Patient is well this morning sitter in the room.  Didn't attempt to remove the brace last night as she did the night before. PT and care management to assist with discharge planning, will need SNF at discharge. Negative for chest pain and shortness of breath Fever: no Gastrointestinal:Negative for nausea and vomiting Reports she is passing some gas this Am.  Is urinating well without issue.  Objective: Vital signs in last 24 hours: Temp:  [98.3 F (36.8 C)-99.7 F (37.6 C)] 98.5 F (36.9 C) (09/17 0616) Pulse Rate:  [84-107] 84 (09/17 0616) Resp:  [16-19] 19 (09/17 0616) BP: (125-142)/(59-66) 142/66 (09/17 0616) SpO2:  [92 %-95 %] 93 % (09/17 0616)  Intake/Output from previous day:  Intake/Output Summary (Last 24 hours) at 06/07/2024 0748 Last data filed at 06/06/2024 1900 Gross per 24 hour  Intake 100 ml  Output --  Net 100 ml    Intake/Output this shift: No intake/output data recorded.  Labs: Recent Labs    06/05/24 0503  HGB 10.4*   Recent Labs    06/05/24 0503  WBC 13.1*  RBC 3.54*  HCT 31.1*  PLT 188   Recent Labs    06/05/24 0503  NA 139  K 4.1  CL 106  CO2 24  BUN 12  CREATININE 0.77  GLUCOSE 139*  CALCIUM  8.2*   No results for input(s): LABPT, INR in the last 72 hours.    EXAM General - Patient is alert but confused this AM. Extremity - ABD soft Sensation intact distally Dorsiflexion/Plantar flexion intact Dressing/Incision - Knee ROM brace intact to the left leg. No drainage noted.  Leg soft to palpation. Brace removed, honeycomb dressings intact without drainage noted. Motor Function - intact, moving foot and toes well on exam.  Abdomen soft with intact bowel sounds this AM.  Past Medical History:  Diagnosis Date   Arthritis    BP (high blood pressure)  08/21/2014   no current issues   Brain infection 2008   Breast cancer (HCC) 2001   Left side mastectomy   History of kidney stones    h/o   Hypothyroidism    Personal history of chemotherapy 2001   BREAST CA   Personal history of radiation therapy 2001   BREAST CA   Radiation 2001   BREAST CA   S/P chemotherapy, time since greater than 12 weeks 2001   BREAST CA   Tachycardia    Thyroid  disease    Tick fever    Wears contact lenses     Assessment/Plan: 3 Days Post-Op Procedure(s) (LRB): OPEN REDUCTION INTERNAL FIXATION (ORIF) DISTAL FEMUR FRACTURE (Left) Principal Problem:   Femur fracture, left (HCC)  Estimated body mass index is 23.3 kg/m as calculated from the following:   Height as of this encounter: 5' 5 (1.651 m).   Weight as of this encounter: 63.5 kg. Advance diet Up with therapy D/C IV fluids when tolerating po intake.  Vitals reviewed, HR 84, denies any chest pain or SOB. Up with therapy today.  Was able to walk 20 feet with therapy yesterday. Continue to work on a BM. Was at assisted living prior to fall, will need SNF at discharge.  DVT Prophylaxis - Lovenox  50% WB to the left leg, locked in extension for ambulation. Can unlock knee brace to work on knee flexion when non-ambulatory.  [name] Double  Kip RIGGERS [facility] 06/07/2024, 7:48 AM

## 2024-06-07 NOTE — Progress Notes (Signed)
 Occupational Therapy Treatment Patient Details Name: Debra Patel MRN: [id number] DOB: [birth year] Today's Date: 06/07/2024   History of present illness Pt is an 83 y/o F admitted on 06/04/24 after presenting with c/o a fall OOB. Pt found to have closed displaced periprosthetic L distal femur fx & underwent ORIF on 06/04/24. PMH: dementia, hypothyroidism, breast CA s/p L side mastectomy   OT comments  Debra Patel was seen for OT treatment on this date. Upon arrival to room pt in bed with family present, agreeable to tx. Pt requires MIN A for bed mobility - assist for LLE. CGA + RW sit<>stand and steps to sink for standing grooming tasks SBA. Educated family on knee brace, d/c recs, and weight bearing status. Pt making good progress toward goals, will continue to follow POC. Discharge recommendation remains appropriate.       If plan is discharge home, recommend the following:  A little help with walking and/or transfers;A little help with bathing/dressing/bathroom;Supervision due to cognitive status   Equipment Recommendations  BSC/3in1    Recommendations for Other Services      Precautions / Restrictions Precautions Precautions: Fall Recall of Precautions/Restrictions: Impaired Required Braces or Orthoses: Knee Immobilizer - Left Knee Immobilizer - Left: On at all times Restrictions Weight Bearing Restrictions Per Provider Order: Yes LLE Weight Bearing Per Provider Order: Partial weight bearing LLE Partial Weight Bearing Percentage or Pounds: 50% Other Position/Activity Restrictions: knee brace locked in extension for gait, unlocked for ROM at rest       Mobility Bed Mobility Overal bed mobility: Needs Assistance Bed Mobility: Supine to Sit, Sit to Supine     Supine to sit: Min assist Sit to supine: Min assist        Transfers Overall transfer level: Needs assistance Equipment used: Rolling walker (2 wheels) Transfers: Sit to/from Stand Sit to Stand: Contact guard  assist                 Balance Overall balance assessment: Needs assistance Sitting-balance support: Feet supported Sitting balance-Leahy Scale: Good     Standing balance support: No upper extremity supported, During functional activity Standing balance-Leahy Scale: Fair                             ADL either performed or assessed with clinical judgement   ADL Overall ADL's : Needs assistance/impaired                                       General ADL Comments: CGA + RW for simulated BSC t/f and standing grooming task.    Extremity/Trunk Assessment              Vision       Restaurant manager, fast food Communication: No apparent difficulties   Cognition Arousal: Alert Behavior During Therapy: WFL for tasks assessed/performed Cognition: History of cognitive impairments                               Following commands: Intact Following commands impaired: Follows one step commands with increased time      Cueing   Cueing Techniques: Verbal cues, Tactile cues  Exercises      Shoulder Instructions       General Comments Unlocked L knee  brace and had pt perform A/AAROM in non wt bearing positions.    Pertinent Vitals/ Pain       Pain Assessment Pain Assessment: 0-10 Pain Score: 4  Pain Location: LLE Pain Descriptors / Indicators: Discomfort, Dull, Grimacing, Guarding Pain Intervention(s): Limited activity within patient's tolerance   Frequency  Min 3X/week        Progress Toward Goals  OT Goals(current goals can now be found in the care plan section)  Progress towards OT goals: Progressing toward goals  Acute Rehab OT Goals OT Goal Formulation: With patient/family Time For Goal Achievement: 06/19/24 Potential to Achieve Goals: Good ADL Goals Pt Will Perform Grooming: with modified independence;standing Pt Will Perform Lower Body Dressing: with modified independence;sit  to/from stand Pt Will Transfer to Toilet: with modified independence;ambulating;regular height toilet  Plan      Co-evaluation        PT goals addressed during session: Mobility/safety with mobility;Balance;Proper use of DME;Strengthening/ROM        AM-PAC OT 6 Clicks Daily Activity     Outcome Measure   Help from another person eating meals?: None Help from another person taking care of personal grooming?: A Little Help from another person toileting, which includes using toliet, bedpan, or urinal?: A Little Help from another person bathing (including washing, rinsing, drying)?: A Lot Help from another person to put on and taking off regular upper body clothing?: A Little Help from another person to put on and taking off regular lower body clothing?: A Lot 6 Click Score: 17    End of Session Equipment Utilized During Treatment: Rolling walker (2 wheels)  OT Visit Diagnosis: Other abnormalities of gait and mobility (R26.89);Muscle weakness (generalized) (M62.81)   Activity Tolerance Patient tolerated treatment well   Patient Left in bed;with call bell/phone within reach;with bed alarm set;with family/visitor present   Nurse Communication          Time: 8683-8661 OT Time Calculation (min): 22 min  Charges: OT General Charges $OT Visit: 1 Visit OT Treatments $Self Care/Home Management : 8-22 mins  Debra Patel, M.S. OTR/L  06/07/24, 3:21 PM  ascom [phone]

## 2024-06-07 NOTE — Progress Notes (Signed)
  PROGRESS NOTE    Debra Patel  FMW:[id number] DOB: [birth year] DOA: 06/04/2024 PCP: Steva Clotilda DEL, NP  [id number]  LOS: 3 days   Brief hospital course:  Debra Patel is a 83 y.o. female with medical history significant of dementia, Hypothyroidism was BIBEMS from [facility] assisted living due to mechanical fall out of bed.    Reportedly had a fall out of bed, onto the laminate flooring.  Did not hit her head.  Has severe pain in the left hip and left lower extremity down to the knee.  LLE shortened and externally rotated, and reported severe pain.   Assessment & Plan:   Acute comminuted fracture of the left femur S/p ORIF on 06/04/24 --PT/OT-recommending SNF --per ALF, pt will need STR before returning  --TOC is working on placement   Dementia --speaks coherently, but not based on reality.  Oriented x1 only. --cont memantine   Hospital delirium --pt became more confused and agitated at night. --start seroquel  50 mg nightly --IV haldol  PRN   Hypothyroidism --cont Synthroid   DVT prophylaxis: Lovenox  SQ Code Status: DNR  Family Communication: Discussed with daughter at bedside Level of care: Med-Surg Dispo:   The patient is from: ALF Anticipated d/c is to: SNF rehab Anticipated d/c date is: whenever SNF accepts  Subjective and Interval History:  Patient was seen and examined today.  Pain seems well-controlled, did not had any bowel movement yet.  Per daughter this is normal, she does not get daily bowel movements and stay constipated for weeks.  Objective: Vitals:   06/06/24 1736 06/06/24 1930 06/07/24 0616 06/07/24 0816  BP: (!) 125/59 139/66 (!) 142/66 118/61  Pulse: 95 (!) 107 84 81  Resp: 18 19 19 18   Temp: 98.3 F (36.8 C) 99.7 F (37.6 C) 98.5 F (36.9 C) 98.7 F (37.1 C)  TempSrc: Oral Oral Oral   SpO2: 95% 94% 93% 93%  Weight:      Height:        Intake/Output Summary (Last 24 hours) at 06/07/2024 1513 Last data filed at 06/07/2024  1300 Gross per 24 hour  Intake 480 ml  Output --  Net 480 ml   Filed Weights   06/04/24 0523  Weight: 63.5 kg    Examination:  General.  Frail elderly lady, in no acute distress. Pulmonary.  Lungs clear bilaterally, normal respiratory effort. CV.  Regular rate and rhythm, no JVD, rub or murmur. Abdomen.  Soft, nontender, nondistended, BS positive. CNS.  Alert and oriented to self.  No focal neurologic deficit. Extremities.  No edema, pulses intact and symmetrical.     Data Reviewed: I have personally reviewed labs and imaging studies  Time spent: 40 minutes  This record has been created using Conservation officer, historic buildings. Errors have been sought and corrected,but may not always be located. Such creation errors do not reflect on the standard of care.   Amaryllis Dare, MD [facility] If 7PM-7AM, please contact night-coverage 06/07/2024, 3:13 PM

## 2024-06-07 NOTE — Progress Notes (Addendum)
 Physical Therapy Treatment Patient Details Name: Debra Patel MRN: [id number] DOB: 1940/12/13 Today's Date: 06/07/2024   History of Present Illness Pt is an 83 y/o F admitted on 06/04/24 after presenting with c/o a fall OOB. Pt found to have closed displaced periprosthetic L distal femur fx & underwent ORIF on 06/04/24. PMH: dementia, hypothyroidism, breast CA s/p L side mastectomy    PT Comments  Pt was long sitting in bed with supportive daughter at bedside. Pt is alert and agreeable. Does have baseline dementia however easily and consistently able to follow commands. Reviewed brace orders and need to have brace locked in extension during standing activity. Author did have knee brace unlocked while pt perform light ROM to LLE. AROM to ~ 90 degrees. Pt tolerated gait to doorway of room with knee brace locked in extension. More antalgic gait today with constant vcs for increased BUE wt bearing during RLE advancement to adhere to 50% PWB. Overall pt is progressing well. She remains far from her baseline abilities and will benefit from continued skilled PT to maximize independence and safety with all ADLs.    If plan is discharge home, recommend the following: A little help with walking and/or transfers;A little help with bathing/dressing/bathroom     Equipment Recommendations  Rolling walker (2 wheels);BSC/3in1       Precautions / Restrictions Precautions Precautions: Fall Recall of Precautions/Restrictions: Impaired Required Braces or Orthoses: Knee Immobilizer - Right (ROM knee brace locked in extension with standing activity but can be unlocked during seated/non wt bearing) Knee Immobilizer - Right: On at all times Restrictions Weight Bearing Restrictions Per Provider Order: Yes LLE Weight Bearing Per Provider Order: Partial weight bearing LLE Partial Weight Bearing Percentage or Pounds: 50% Other Position/Activity Restrictions: knee brace locked in extension for gait, unlocked for ROM  at rest     Mobility  Bed Mobility Overal bed mobility: Needs Assistance Bed Mobility: Supine to Sit, Sit to Supine  Supine to sit: Min assist Sit to supine: Min assist   Transfers Overall transfer level: Needs assistance Equipment used: Rolling walker (2 wheels) Transfers: Sit to/from Stand Sit to Stand: Min assist, Mod assist, From elevated surface  General transfer comment: pt was able to stand from elevated bed height to RW with knee brace locked in extension. Vcs for handplacement and technique. pt requires vcs for PWB restrictions.    Ambulation/Gait Ambulation/Gait assistance: Contact guard assist Gait Distance (Feet): 30 Feet Assistive device: Rolling walker (2 wheels) Gait Pattern/deviations: Step-to pattern, Decreased step length - left, Decreased step length - right, Decreased dorsiflexion - left, Decreased stride length, Trunk flexed, Antalgic Gait velocity: decreased  General Gait Details: Pt tolerated ambulation to doorway of room and return. Antalgic step to gait with vcs for increased UE wt bearing during RLE advancement   Balance Overall balance assessment: Needs assistance Sitting-balance support: Feet supported Sitting balance-Leahy Scale: Good     Standing balance support: Bilateral upper extremity supported, During functional activity, Reliant on assistive device for balance Standing balance-Leahy Scale: Fair Standing balance comment: pt remains at risk of falls due to cognition deficits in addition to wt bearing restrictions + LLE deficits.     Communication Communication Communication: No apparent difficulties  Cognition Arousal: Alert Behavior During Therapy: WFL for tasks assessed/performed   PT - Cognitive impairments: History of cognitive impairments   PT - Cognition Comments: hx of dementia. Pt is pleasantly confused. sitter discontinued Following commands: Intact Following commands impaired: Follows one step commands with increased time  Cueing Cueing Techniques: Verbal cues, Tactile cues     General Comments General comments (skin integrity, edema, etc.): Unlocked L knee brace and had pt perform A/AAROM in non wt bearing positions.      Pertinent Vitals/Pain Pain Assessment Pain Assessment: 0-10 Pain Score: 4  Pain Location: LLE Pain Descriptors / Indicators: Discomfort, Dull, Grimacing, Guarding Pain Intervention(s): Limited activity within patient's tolerance, Monitored during session, Premedicated before session, Repositioned     PT Goals (current goals can now be found in the care plan section) Acute Rehab PT Goals Patient Stated Goal: get better Progress towards PT goals: Progressing toward goals    Frequency    7X/week       Co-evaluation     PT goals addressed during session: Mobility/safety with mobility;Balance;Proper use of DME;Strengthening/ROM        AM-PAC PT 6 Clicks Mobility   Outcome Measure  Help needed turning from your back to your side while in a flat bed without using bedrails?: A Little Help needed moving from lying on your back to sitting on the side of a flat bed without using bedrails?: A Lot Help needed moving to and from a bed to a chair (including a wheelchair)?: A Lot Help needed standing up from a chair using your arms (e.g., wheelchair or bedside chair)?: A Lot Help needed to walk in hospital room?: A Little Help needed climbing 3-5 steps with a railing? : A Lot 6 Click Score: 14    End of Session   Activity Tolerance: Patient limited by pain;Patient tolerated treatment well Patient left: in bed;with call bell/phone within reach;with bed alarm set;with nursing/sitter in room Nurse Communication: Mobility status;Weight bearing status;Precautions PT Visit Diagnosis: Muscle weakness (generalized) (M62.81);Other abnormalities of gait and mobility (R26.89);Difficulty in walking, not elsewhere classified (R26.2);Unsteadiness on feet (R26.81)     Time: 8961-8898 PT  Time Calculation (min) (ACUTE ONLY): 23 min  Charges:    $Gait Training: 8-22 mins $Therapeutic Exercise: 8-22 mins PT General Charges $$ ACUTE PT VISIT: 1 Visit                    Rankin Essex PTA 06/07/24, 12:13 PM

## 2024-06-08 DIAGNOSIS — S7292XA Unspecified fracture of left femur, initial encounter for closed fracture: Secondary | ICD-10-CM | POA: Diagnosis not present

## 2024-06-08 MED ORDER — LACTULOSE 10 GM/15ML PO SOLN: 30.0000 g | Freq: Every day | ORAL | Status: DC | PRN

## 2024-06-08 NOTE — Progress Notes (Signed)
  Subjective: 4 Days Post-Op Procedure(s) (LRB): OPEN REDUCTION INTERNAL FIXATION (ORIF) DISTAL FEMUR FRACTURE (Left) Patient reports pain as mild in the left leg this AM. Patient family at bedside. PT and care management to assist with discharge planning, will need SNF at discharge. Negative for chest pain and shortness of breath Fever: no Gastrointestinal:Negative for nausea and vomiting Reports she is passing some gas this Am.  Is urinating well without issue.  Objective: Vital signs in last 24 hours: Temp:  [97.5 F (36.4 C)-99.3 F (37.4 C)] 98.4 F (36.9 C) (09/18 0741) Pulse Rate:  [86-102] 92 (09/18 0741) Resp:  [17-18] 18 (09/18 0741) BP: (105-133)/(74-78) 105/78 (09/18 0741) SpO2:  [91 %-95 %] 91 % (09/18 0741)  Intake/Output from previous day:  Intake/Output Summary (Last 24 hours) at 06/08/2024 1010 Last data filed at 06/07/2024 2200 Gross per 24 hour  Intake 320 ml  Output --  Net 320 ml    Intake/Output this shift: No intake/output data recorded.  Labs: No results for input(s): HGB in the last 72 hours.  No results for input(s): WBC, RBC, HCT, PLT in the last 72 hours.  No results for input(s): NA, K, CL, CO2, BUN, CREATININE, GLUCOSE, CALCIUM  in the last 72 hours.  No results for input(s): LABPT, INR in the last 72 hours.    EXAM General - Patient is alert but confused this AM. Extremity - ABD soft Sensation intact distally Dorsiflexion/Plantar flexion intact Dressing/Incision - Knee ROM brace intact to the left leg. No drainage noted.  Leg soft to palpation. Brace removed, honeycomb dressings intact without drainage noted. Motor Function - intact, moving foot and toes well on exam.  Abdomen soft with intact bowel sounds this AM.  Past Medical History:  Diagnosis Date   Arthritis    BP (high blood pressure) 08/21/2014   no current issues   Brain infection 2008   Breast cancer (HCC) 2001   Left side mastectomy    History of kidney stones    h/o   Hypothyroidism    Personal history of chemotherapy 2001   BREAST CA   Personal history of radiation therapy 2001   BREAST CA   Radiation 2001   BREAST CA   S/P chemotherapy, time since greater than 12 weeks 2001   BREAST CA   Tachycardia    Thyroid  disease    Tick fever    Wears contact lenses     Assessment/Plan: 4 Days Post-Op Procedure(s) (LRB): OPEN REDUCTION INTERNAL FIXATION (ORIF) DISTAL FEMUR FRACTURE (Left) Principal Problem:   Femur fracture, left (HCC)  Estimated body mass index is 23.3 kg/m as calculated from the following:   Height as of this encounter: 5' 5 (1.651 m).   Weight as of this encounter: 63.5 kg. Advance diet Up with therapy D/C IV fluids when tolerating po intake.  Vitals reviewed, HR 92, denies any chest pain or SOB. Up with therapy today.  Was able to walk 30 feet with therapy yesterday. Continue to work on a BM. Was at assisted living prior to fall, will need SNF at discharge.  DVT Prophylaxis - Lovenox  50% WB to the left leg, locked in extension for ambulation. Can unlock knee brace to work on knee flexion when non-ambulatory.  [name] Gustavo Level, PA-C [facility] Orthopaedic Surgery 06/08/2024, 10:10 AM

## 2024-06-08 NOTE — Progress Notes (Signed)
  PROGRESS NOTE    Debra Patel  FMW:[id number] DOB: 1940/10/26 DOA: 06/04/2024 PCP: Steva Clotilda DEL, NP  [id number]  LOS: 4 days   Brief hospital course:  Debra Patel is a 83 y.o. female with medical history significant of dementia, Hypothyroidism was BIBEMS from [facility] assisted living due to mechanical fall out of bed.    Reportedly had a fall out of bed, onto the laminate flooring.  Did not hit her head.  Has severe pain in the left hip and left lower extremity down to the knee.  LLE shortened and externally rotated, and reported severe pain.   9/18: Remained hemodynamically stable, peer to peer was done today for SNF placement.  Assessment & Plan:   Acute comminuted fracture of the left femur S/p ORIF on 06/04/24 --PT/OT-recommending SNF --per ALF, pt will need STR before returning  --TOC is working on placement   Dementia --speaks coherently, but not based on reality.  Oriented x1 only. --cont memantine   Hospital delirium --pt became more confused and agitated at night. --start seroquel  50 mg nightly --IV haldol  PRN   Hypothyroidism --cont Synthroid   DVT prophylaxis: Lovenox  SQ Code Status: DNR  Family Communication: Discussed with daughter at bedside Level of care: Med-Surg Dispo:   The patient is from: ALF Anticipated d/c is to: SNF rehab Anticipated d/c date is: Awaiting insurance authorization-peer to peer was done today  Subjective and Interval History:  Patient was lying comfortably in bed.  No new concern.  Objective: Vitals:   06/07/24 2002 06/08/24 0431 06/08/24 0741 06/08/24 1543  BP: 122/78 133/74 105/78 (!) 119/57  Pulse: (!) 102 86 92 87  Resp: 18 18 18 17   Temp: 98.8 F (37.1 C) (!) 97.5 F (36.4 C) 98.4 F (36.9 C) 98.6 F (37 C)  TempSrc:  Oral    SpO2: 95% 94% 91% 96%  Weight:      Height:        Intake/Output Summary (Last 24 hours) at 06/08/2024 1647 Last data filed at 06/08/2024 1300 Gross per 24 hour  Intake  800 ml  Output --  Net 800 ml   Filed Weights   06/04/24 0523  Weight: 63.5 kg    Examination:  General.  Frail elderly lady, in no acute distress. Pulmonary.  Lungs clear bilaterally, normal respiratory effort. CV.  Regular rate and rhythm, no JVD, rub or murmur. Abdomen.  Soft, nontender, nondistended, BS positive. CNS.  Alert and oriented .  No focal neurologic deficit. Extremities.  No edema, no cyanosis, pulses intact and symmetrical.     Data Reviewed: I have personally reviewed labs and imaging studies  Time spent: 39 minutes  This record has been created using Conservation officer, historic buildings. Errors have been sought and corrected,but may not always be located. Such creation errors do not reflect on the standard of care.   Amaryllis Dare, MD [facility] If 7PM-7AM, please contact night-coverage 06/08/2024, 4:47 PM

## 2024-06-08 NOTE — Progress Notes (Addendum)
 Physical Therapy Treatment Patient Details Name: Debra Patel MRN: [id number] DOB: Jan 26, 1941 Today's Date: 06/08/2024   History of Present Illness Pt is an 83 y/o F admitted on 06/04/24 after presenting with c/o a fall OOB. Pt found to have closed displaced periprosthetic L distal femur fx & underwent ORIF on 06/04/24. PMH: dementia, hypothyroidism, breast CA s/p L side mastectomy    PT Comments  Pt was seated in recliner upon arrival. Anxious in presentation. Maybe starting to sundown? Pt's daughter left around lunch time to go home to rest for awhile. Pt endorses I need you to call my daughter to come get me right now! Author re-assured pt that her daughter will be returning and that she is safe. Eventually pt calms and is agreeable to PT session. Pt was able to tolerate light AROM with ROM knee brace unlocked. Pt tolerates it well overall but did not understand needing to have it locked straight when up ambulating. Will continue to keep brace locked in extension until cleared otherwise. Pt struggles with 50% PWB so gait distances remain limited to allow healing. Overall progressing well but cognition greatly impacts progress. DC recs remain appropriate to maximize her independence and safety with all ADLs    If plan is discharge home, recommend the following: A little help with walking and/or transfers;A little help with bathing/dressing/bathroom     Equipment Recommendations  Rolling walker (2 wheels);BSC/3in1       Precautions / Restrictions Precautions Precautions: Fall Recall of Precautions/Restrictions: Impaired Required Braces or Orthoses: Knee Immobilizer - Right (ROM knee brace locked in extension with standing activity but can be unlocked during seated/non wt bearing) Knee Immobilizer - Right: On at all times Restrictions Weight Bearing Restrictions Per Provider Order: Yes LLE Weight Bearing Per Provider Order: Partial weight bearing LLE Partial Weight Bearing Percentage or  Pounds: 50% Other Position/Activity Restrictions: knee brace locked in extension for gait, unlocked for ROM at rest     Mobility  Bed Mobility  General bed mobility comments: In recliner pre/post session    Transfers Overall transfer level: Needs assistance Equipment used: Rolling walker (2 wheels) Transfers: Sit to/from Stand Sit to Stand: Min assist, Mod assist, From elevated surface  General transfer comment: pt was able to stand from elevated bed height to RW with knee brace locked in extension. Vcs for handplacement and technique. pt requires vcs for PWB restrictions.    Ambulation/Gait Ambulation/Gait assistance: Contact guard assist Assistive device: Rolling walker (2 wheels) Gait Pattern/deviations: Step-to pattern, Decreased step length - left, Decreased step length - right, Decreased dorsiflexion - left, Decreased stride length, Trunk flexed, Antalgic Gait velocity: decreased  General Gait Details: Pt tolerated ambulation to doorway of room and return. Antalgic step to gait with vcs for increased UE wt bearing during RLE advancement    Balance Overall balance assessment: Needs assistance Sitting-balance support: Feet supported Sitting balance-Leahy Scale: Good     Standing balance support: Bilateral upper extremity supported, During functional activity, Reliant on assistive device for balance Standing balance-Leahy Scale: Fair Standing balance comment: pt remains at risk of falls due to cognition deficits in addition to wt bearing restrictions + LLE deficits.       Communication Communication Communication: No apparent difficulties  Cognition Arousal: Alert Behavior During Therapy: WFL for tasks assessed/performed   PT - Cognitive impairments: History of cognitive impairments    PT - Cognition Comments: Pt slightly anxious upon arrival. She has hx of dementia...sundowning? Following commands: Intact Following commands impaired: Follows one  step commands with  increased time    Cueing Cueing Techniques: Verbal cues, Tactile cues     General Comments General comments (skin integrity, edema, etc.): performed gentle AROM with knee brace unlocked while seated in recliner      Pertinent Vitals/Pain Pain Assessment Pain Location: LLE Pain Descriptors / Indicators: Discomfort, Dull, Grimacing, Guarding     PT Goals (current goals can now be found in the care plan section) Acute Rehab PT Goals Patient Stated Goal: I need to go home. Progress towards PT goals: Progressing toward goals    Frequency    7X/week       Co-evaluation     PT goals addressed during session: Mobility/safety with mobility;Balance;Proper use of DME;Strengthening/ROM        AM-PAC PT 6 Clicks Mobility   Outcome Measure  Help needed turning from your back to your side while in a flat bed without using bedrails?: A Little Help needed moving from lying on your back to sitting on the side of a flat bed without using bedrails?: A Lot Help needed moving to and from a bed to a chair (including a wheelchair)?: A Lot Help needed standing up from a chair using your arms (e.g., wheelchair or bedside chair)?: A Lot Help needed to walk in hospital room?: A Little Help needed climbing 3-5 steps with a railing? : A Lot 6 Click Score: 14    End of Session Equipment Utilized During Treatment: Other (comment) (ROM knee brace) Activity Tolerance: Patient limited by pain;Patient tolerated treatment well Patient left: in bed;with call bell/phone within reach;with bed alarm set;with nursing/sitter in room Nurse Communication: Mobility status;Weight bearing status;Precautions PT Visit Diagnosis: Muscle weakness (generalized) (M62.81);Other abnormalities of gait and mobility (R26.89);Difficulty in walking, not elsewhere classified (R26.2);Unsteadiness on feet (R26.81)     Time: 8479-8470 PT Time Calculation (min) (ACUTE ONLY): 9 min  Charges:    $Gait Training: 8-22  mins PT General Charges $$ ACUTE PT VISIT: 1 Visit                     Rankin Essex PTA 06/08/24, 4:40 PM

## 2024-06-08 NOTE — Progress Notes (Signed)
 Mobility Specialist - Progress Note     06/08/24 1455  Mobility  Activity Ambulated with assistance;Stood at bedside  Level of Assistance Contact guard assist, steadying assist  Assistive Device Front wheel walker  Distance Ambulated (ft) 35 ft  Range of Motion/Exercises Active  LLE Weight Bearing Per Provider Order PWB  LLE Partial Weight Bearing Percentage or Pounds 50  Activity Response Tolerated well  Mobility Referral Yes  Mobility visit 1 Mobility  Mobility Specialist Start Time (ACUTE ONLY) 1421  Mobility Specialist Stop Time (ACUTE ONLY) 1442  Mobility Specialist Time Calculation (min) (ACUTE ONLY) 21 min   Pt resting in bed on RA upon entry. Pt STS and ambulates to hallway around NS PWB 50% with RW. Pt took x1 seated rest break before rolling back room in recliner. Pt left in recliner with needs in reach and chair alarm activated.   Guido Rumble Mobility Specialist 06/08/24, 3:06 PM

## 2024-06-09 DIAGNOSIS — G8911 Acute pain due to trauma: Secondary | ICD-10-CM | POA: Diagnosis not present

## 2024-06-09 DIAGNOSIS — D72829 Elevated white blood cell count, unspecified: Secondary | ICD-10-CM | POA: Diagnosis not present

## 2024-06-09 DIAGNOSIS — W19XXXA Unspecified fall, initial encounter: Secondary | ICD-10-CM

## 2024-06-09 DIAGNOSIS — S7292XA Unspecified fracture of left femur, initial encounter for closed fracture: Secondary | ICD-10-CM | POA: Diagnosis not present

## 2024-06-09 DIAGNOSIS — I10 Essential (primary) hypertension: Secondary | ICD-10-CM | POA: Diagnosis not present

## 2024-06-09 DIAGNOSIS — E039 Hypothyroidism, unspecified: Secondary | ICD-10-CM | POA: Diagnosis not present

## 2024-06-09 DIAGNOSIS — S72142D Displaced intertrochanteric fracture of left femur, subsequent encounter for closed fracture with routine healing: Secondary | ICD-10-CM | POA: Diagnosis not present

## 2024-06-09 DIAGNOSIS — S72142A Displaced intertrochanteric fracture of left femur, initial encounter for closed fracture: Secondary | ICD-10-CM | POA: Diagnosis not present

## 2024-06-09 DIAGNOSIS — Z7401 Bed confinement status: Secondary | ICD-10-CM | POA: Diagnosis not present

## 2024-06-09 DIAGNOSIS — M6281 Muscle weakness (generalized): Secondary | ICD-10-CM | POA: Diagnosis not present

## 2024-06-09 DIAGNOSIS — G309 Alzheimer's disease, unspecified: Secondary | ICD-10-CM | POA: Diagnosis not present

## 2024-06-09 DIAGNOSIS — R059 Cough, unspecified: Secondary | ICD-10-CM | POA: Diagnosis not present

## 2024-06-09 DIAGNOSIS — E785 Hyperlipidemia, unspecified: Secondary | ICD-10-CM | POA: Diagnosis not present

## 2024-06-09 DIAGNOSIS — K5900 Constipation, unspecified: Secondary | ICD-10-CM | POA: Diagnosis not present

## 2024-06-09 DIAGNOSIS — R300 Dysuria: Secondary | ICD-10-CM | POA: Diagnosis not present

## 2024-06-09 DIAGNOSIS — R262 Difficulty in walking, not elsewhere classified: Secondary | ICD-10-CM | POA: Diagnosis not present

## 2024-06-09 DIAGNOSIS — S79929A Unspecified injury of unspecified thigh, initial encounter: Secondary | ICD-10-CM | POA: Diagnosis not present

## 2024-06-09 DIAGNOSIS — Z86711 Personal history of pulmonary embolism: Secondary | ICD-10-CM | POA: Diagnosis not present

## 2024-06-09 DIAGNOSIS — M898X5 Other specified disorders of bone, thigh: Secondary | ICD-10-CM | POA: Diagnosis not present

## 2024-06-09 DIAGNOSIS — F03918 Unspecified dementia, unspecified severity, with other behavioral disturbance: Secondary | ICD-10-CM | POA: Diagnosis not present

## 2024-06-09 DIAGNOSIS — N182 Chronic kidney disease, stage 2 (mild): Secondary | ICD-10-CM | POA: Diagnosis not present

## 2024-06-09 DIAGNOSIS — M1990 Unspecified osteoarthritis, unspecified site: Secondary | ICD-10-CM | POA: Diagnosis not present

## 2024-06-09 DIAGNOSIS — D649 Anemia, unspecified: Secondary | ICD-10-CM | POA: Diagnosis not present

## 2024-06-09 DIAGNOSIS — J309 Allergic rhinitis, unspecified: Secondary | ICD-10-CM | POA: Diagnosis not present

## 2024-06-09 DIAGNOSIS — R000 Tachycardia, unspecified: Secondary | ICD-10-CM | POA: Diagnosis not present

## 2024-06-09 MED ORDER — FE FUM-VIT C-VIT B12-FA 460-60-0.01-1 MG PO CAPS: 1.0000 | ORAL_CAPSULE | Freq: Every day | ORAL | Status: AC

## 2024-06-09 MED ORDER — QUETIAPINE FUMARATE 50 MG PO TABS: 50.0000 mg | ORAL_TABLET | Freq: Every day | ORAL | Status: AC

## 2024-06-09 MED ORDER — LACTULOSE 10 GM/15ML PO SOLN: 30.0000 g | Freq: Every day | ORAL | Status: DC | PRN

## 2024-06-09 MED ORDER — HALOPERIDOL LACTATE 5 MG/ML IJ SOLN
2.0000 mg | Freq: Once | INTRAMUSCULAR | Status: DC
Start: 2024-06-09 — End: 2024-06-10

## 2024-06-09 MED ORDER — ALPRAZOLAM 0.25 MG PO TABS
0.2500 mg | ORAL_TABLET | Freq: Once | ORAL | Status: AC
  Administered 2024-06-09: 0.25 mg via ORAL
  Filled 2024-06-09: qty 1

## 2024-06-09 NOTE — Discharge Summary (Signed)
 Physician Discharge Summary   Patient: Debra Patel MRN: [id number] DOB: 1941-02-01  Admit date:     06/04/2024  Discharge date: 06/09/24  Discharge Physician: Amaryllis Dare   PCP: Steva Clotilda DEL, NP   Recommendations at discharge:  Please obtain CBC and BMP and follow-up Follow-up with primary care provider Follow-up with orthopedic surgery  Discharge Diagnoses: Principal Problem:   Femur fracture, left [facility]) Active Problems:   [facility] Course: Debra Patel is a 83 y.o. female with medical history significant of dementia, Hypothyroidism was BIBEMS from [facility] assisted living due to mechanical fall out of bed.     Reportedly had a fall out of bed, onto the laminate flooring.  Did not hit her head.  Has severe pain in the left hip and left lower extremity down to the knee.  LLE shortened and externally rotated, and reported severe pain.   Patient was found to have acute comminuted fracture of left femur, orthopedic was consulted and patient was taken to the OR s/p ORIF on 06/04/2024.  Tolerated the procedure well. Physical therapist recommended going to SNF for rehab after the procedure where she is being discharged.  Patient was given Lovenox  as DVT prophylaxis.  She was also given an opioid as pain medication which she should be using only if Tylenol  does not work.  Please avoid constipation and use regular bowel regimen if using opioid.  Patient did develop some transient hospital-acquired delirium which responded well to Seroquel  50 mg at night which she can continue at rehab.  Patient has an history of dementia, she speaks coherently.  Oriented x 1 at baseline and she will continue home memantine .  Patient will continue with the rest of her home medications and follow-up with her providers for further assistance.   Pain control -   Controlled Substance Reporting System database was reviewed. and patient was instructed, not to drive, operate  heavy machinery, perform activities at heights, swimming or participation in water activities or provide baby-sitting services while on Pain, Sleep and Anxiety Medications; until their outpatient Physician has advised to do so again. Also recommended to not to take more than prescribed Pain, Sleep and Anxiety Medications.  Consultants: Orthopedic surgery Procedures performed: ORIF Disposition: Skilled nursing facility Diet recommendation:  Discharge Diet Orders (From admission, onward)     Start     Ordered   06/09/24 0000  Diet - low sodium heart healthy        06/09/24 1234           Regular diet DISCHARGE MEDICATION: Allergies as of 06/09/2024       Reactions   Alpha-gal Other (See Comments), Hives, Swelling   Tick bite, sores on body Diagnosed 10/2017   Pyridoxine Anaphylaxis   Hydrocodone  Nausea Only, Nausea And Vomiting   Bee Venom Other (See Comments)   Motrin  [ibuprofen ] Other (See Comments)   depression   Oxycodone  Nausea Only   Latex Rash   NEGATIVE Latex IgE (<0.10) on 03/29/2019  (Develops rash/itching from compression stockings and underwear elastic)   Nickel Rash   Nitrofurantoin Itching        Medication List     TAKE these medications    acetaminophen  325 MG tablet Commonly known as: TYLENOL  Take 650 mg by mouth every 4 (four) hours as needed for mild pain (pain score 1-3).   azelastine  0.1 % nasal spray Commonly known as: ASTELIN  Place 1 spray into both nostrils daily as needed for rhinitis. Use  in each nostril as directed   enoxaparin  40 MG/0.4ML injection Commonly known as: LOVENOX  Inject 0.4 mLs (40 mg total) into the skin daily.   Fe Fum-Vit C-Vit B12-FA Caps capsule Commonly known as: TRIGELS-F FORTE Take 1 capsule by mouth daily after breakfast. Start taking on: June 10, 2024   FLUoxetine  10 MG capsule Commonly known as: PROZAC  Take 10 mg by mouth daily.   lactulose  10 GM/15ML solution Commonly known as: CHRONULAC  Take 45 mLs  (30 g total) by mouth daily as needed for mild constipation or moderate constipation.   loratadine  10 MG tablet Commonly known as: CLARITIN  Take 10 mg by mouth daily.   memantine  5 MG tablet Commonly known as: NAMENDA  Take 5 mg by mouth 2 (two) times daily.   oxyCODONE  5 MG immediate release tablet Commonly known as: Oxy IR/ROXICODONE  Take 1-2 tablets (5-10 mg total) by mouth every 4 (four) hours as needed for moderate pain (pain score 4-6).   QUEtiapine  50 MG tablet Commonly known as: SEROQUEL  Take 1 tablet (50 mg total) by mouth at bedtime.   senna-docusate 8.6-50 MG tablet Commonly known as: Senokot-S Take 2 tablets by mouth 2 (two) times daily as needed for mild constipation.   spironolactone  25 MG tablet Commonly known as: ALDACTONE  Take 12.5 mg by mouth in the morning.   Synthroid  50 MCG tablet Generic drug: levothyroxine  Take 50 mcg by mouth daily before breakfast.               Discharge Care Instructions  (From admission, onward)           Start     Ordered   06/09/24 0000  Leave dressing on - Keep it clean, dry, and intact until clinic visit        06/09/24 1234            Follow-up Information     [name], PA-C Follow up in 14 day(s).   Specialty: Physician Assistant Why: Staple removal and x-rays of the left femur. Contact information: [address] [place] [address] [phone]         Steva Clotilda DEL, NP. Schedule an appointment as soon as possible for a visit in 1 week(s).   Specialty: Family Medicine Contact information: [address] [place] [address] [phone]                Discharge Exam: [name]   06/04/24 0523  Weight: 63.5 kg   General.  Frail elderly lady, in no acute distress. Pulmonary.  Lungs clear bilaterally, normal respiratory effort. CV.  Regular rate and rhythm, no JVD, rub or murmur. Abdomen.  Soft, nontender, nondistended, BS positive. CNS.  Alert  and oriented .  No focal neurologic deficit. Extremities.  No edema,  pulses intact and symmetrical.  Condition at discharge: stable  The results of significant diagnostics from this hospitalization (including imaging, microbiology, ancillary and laboratory) are listed below for reference.   Imaging Studies: DG FEMUR MIN 2 VIEWS LEFT Result Date: 06/04/2024 CLINICAL DATA:  ORIF left femur EXAM: LEFT FEMUR 2 VIEWS COMPARISON:  Left hip x-ray 06/04/2024. FINDINGS: Eight intraoperative fluoroscopic views of the left femur. There is a new lateral sideplate and multiple screws fixating a mid femoral fracture. There is long stem intramedullary nail with knee arthroplasty. Alignment is near anatomic. Fluoroscopy time 1 minutes 42 seconds. Fluoroscopy dose 14.4 micro gray. IMPRESSION: Intraoperative fluoroscopic views of the left femur. Electronically Signed   By: Greig Pique  M.D.   On: 06/04/2024 18:40   DG C-Arm 1-60 Min-No Report Result Date: 06/04/2024 Fluoroscopy was utilized by the requesting physician.  No radiographic interpretation.   CT FEMUR LEFT WO CONTRAST Result Date: 06/04/2024 EXAM: CT OF THE LEFT FEMUR, WITHOUT IV CONTRAST 06/04/2024 07:55:43 AM TECHNIQUE: Axial images were acquired through the left femur without IV contrast. Reformatted images were reviewed. Automated exposure control, iterative reconstruction, and/or weight based adjustment of the mA/kV was utilized to reduce the radiation dose to as low as reasonably achievable. COMPARISON: 11/13/2018 previous left total knee arthroplasty. CLINICAL HISTORY: Upper leg trauma. Pt arrived by ems from [facility] living d/t mechanical fall out of bed. Upon examination pt has shortened and rotated left hip with pain at site. Pt fell on to laminate flooring. Hx dementia. FINDINGS: BONES: Remote healed intertrochanteric fracture of the proximal left femur with im nail and hip screw in place. Status post left knee arthroplasty There is an  acute comminuted fracture deformity involving the distal half of the femur with impaction of the fracture fragments by approximately 1.6 cm and mild lateral angulation of the distal fracture fragments. Fracture line extends up to the superior margin of the femoral component of the left knee arthroplasty device where there is anterior displacement of minor fracture fragments, axial image 76/10. JOINTS: No dislocation. The joint spaces are normal. SOFT TISSUES: The soft tissues are unremarkable. Vascular calcifications noted. LIMITATIONS/ARTIFACTS: Streak artifact from knee prosthesis diminishes exam detail. IMPRESSION: 1. Acute comminuted fracture deformity involving the distal half of the femur with impaction of the fracture fragments by approximately 1.6 cm and mild lateral angulation of the distal fracture fragments. Fracture line extends up to the superior margin of the femoral component of the left knee arthroplasty device where there is anterior displacement of minor fracture fragments. 2. Remote healed intertrochanteric fracture of the proximal left femur with IM nail and hip screw in place. 3. Status post left knee arthroplasty. Electronically signed by: Waddell Calk MD 06/04/2024 08:05 AM EDT RP Workstation: [id number]   DG Hip Unilat W or Wo Pelvis 2-3 Views Left Result Date: 06/04/2024 EXAM: 2 or more VIEW(S) XRAY OF THE LEFT HIP 06/04/2024 06:43:00 AM COMPARISON: 06/29/2018 CLINICAL HISTORY: Pain and deformity after fall. Pt arrived by EMS from [facility] living d/t mechanical fall out of bed. Upon examination pt has shortened and rotated left hip with pain at site. Pt fell on to laminate flooring. Hx dementia. Pt received 1g Tylenol  and 4 mg Zofran  en route. FINDINGS: BONES AND JOINTS: Healed intertrochanteric fracture of the left hip with intramedullary nail in place. Mild degenerative changes of the left hip. Partially visualized acute fracture deformity involving the distal left femur.  SOFT TISSUES: The soft tissues are unremarkable. IMPRESSION: 1. Partially visualized, acute fracture deformity involving the distal left femur. 2. Healed intertrochanteric fracture of the left hip with intramedullary nail in place. 3. Mild degenerative changes of the left hip. Electronically signed by: Waddell Calk MD 06/04/2024 06:54 AM EDT RP Workstation: [id number]   DG Knee 2 Views Left Result Date: 06/04/2024 CLINICAL DATA:  83 year old female status post fall from bed.  Pain. EXAM: LEFT KNEE - 1-2 VIEW COMPARISON:  Postoperative left knee radiographs 04/03/2019. FINDINGS: Two views at 0630 hours. Chronic left total knee arthroplasty, left femur intramedullary rod. Long segment spiral fracture, periprosthetic fracture distal left femoral shaft and metadiaphysis. The fracture appears to terminates at the proximal aspect of the distal femoral condyle implant (lateral view). Mildly displaced  butterfly fragments. Maintained knee joint alignment. Patella appears stable and intact. No proximal tibia or fibula fracture identified. IMPRESSION: Positive for long segment spiral, comminuted, periprosthetic fracture of the distal left femoral shaft and metadiaphysis. Underlying chronic total knee arthroplasty appears to remain aligned. Electronically Signed   By: VEAR Hurst M.D.   On: 06/04/2024 06:54   DG Chest Port 1 View Result Date: 06/04/2024 EXAM: 1 VIEW XRAY OF THE CHEST 06/04/2024 06:43:00 AM COMPARISON: 06/28/2018 CLINICAL HISTORY: Pre-op. Pt arrived by EMS from [facility] living d/t mechanical fall out of bed. Upon examination pt has shortened and rotated left hip with pain at site. Pt fell on to laminate flooring. Hx dementia. Pt received 1g tylenol  and 4 mg zofran  en route. FINDINGS: LUNGS AND PLEURA: No focal pulmonary opacity. No pulmonary edema. No pleural effusion. No pneumothorax. HEART AND MEDIASTINUM: No acute abnormality of the cardiac and mediastinal silhouettes. Atherosclerotic plaque.  BONES AND SOFT TISSUES: No acute osseous abnormality. IMPRESSION: 1. No acute process. Electronically signed by: Waddell Calk MD 06/04/2024 06:51 AM EDT RP Workstation: [id number]    Microbiology: Results for orders placed or performed during the hospital encounter of 07/28/19  SARS CORONAVIRUS 2 (TAT 6-24 HRS) Nasopharyngeal Nasopharyngeal Swab     Status: None   Collection Time: 07/28/19  1:53 PM   Specimen: Nasopharyngeal Swab  Result Value Ref Range Status   SARS Coronavirus 2 NEGATIVE NEGATIVE Final    Comment: (NOTE) SARS-CoV-2 target nucleic acids are NOT DETECTED. The SARS-CoV-2 RNA is generally detectable in upper and lower respiratory specimens during the acute phase of infection. Negative results do not preclude SARS-CoV-2 infection, do not rule out co-infections with other pathogens, and should not be used as the sole basis for treatment or other patient management decisions. Negative results must be combined with clinical observations, patient history, and epidemiological information. The expected result is Negative. Fact Sheet for Patients: HairSlick.no Fact Sheet for Healthcare Providers: quierodirigir.com This test is not yet approved or cleared by the [place]  FDA and  has been authorized for detection and/or diagnosis of SARS-CoV-2 by FDA under an Emergency Use Authorization (EUA). This EUA will remain  in effect (meaning this test can be used) for the duration of the COVID-19 declaration under Section 56 4(b)(1) of the Act, 21 U.S.C. section 360bbb-3(b)(1), unless the authorization is terminated or revoked sooner. Performed at [facility], [address] [place], [place] [address]     Labs: CBC: Recent Labs  Lab 06/04/24 0526 06/05/24 0503  WBC 7.8 13.1*  NEUTROABS 5.3  --   HGB 13.2 10.4*  HCT 39.6 31.1*  MCV 86.7 87.9  PLT 233 188   Basic Metabolic Panel: Recent Labs  Lab  06/04/24 0526 06/05/24 0503  NA 136 139  K 3.5 4.1  CL 102 106  CO2 22 24  GLUCOSE 154* 139*  BUN 15 12  CREATININE 0.82 0.77  CALCIUM  9.0 8.2*   Liver Function Tests: No results for input(s): AST, ALT, ALKPHOS, BILITOT, PROT, ALBUMIN in the last 168 hours. CBG: No results for input(s): GLUCAP in the last 168 hours.  Discharge time spent: greater than 30 minutes.  This record has been created using Conservation officer, historic buildings. Errors have been sought and corrected,but may not always be located. Such creation errors do not reflect on the standard of care.   Signed: Amaryllis Dare, MD [facility] 06/09/2024

## 2024-06-09 NOTE — Progress Notes (Addendum)
  Subjective: 5 Days Post-Op Procedure(s) (LRB): OPEN REDUCTION INTERNAL FIXATION (ORIF) DISTAL FEMUR FRACTURE (Left) Patient and family sleeping well this AM. Current plan is for d/c to SNF, peer to peer performed yesterday. Negative for chest pain and shortness of breath Fever: no Gastrointestinal:Negative for nausea and vomiting Continue to work on a BM.  Objective: Vital signs in last 24 hours: Temp:  [98.4 F (36.9 C)-99.1 F (37.3 C)] 98.4 F (36.9 C) (09/19 0718) Pulse Rate:  [80-92] 83 (09/19 0718) Resp:  [16-18] 17 (09/19 0718) BP: (113-124)/(57-66) 124/61 (09/19 0718) SpO2:  [91 %-96 %] 92 % (09/19 0718)  Intake/Output from previous day:  Intake/Output Summary (Last 24 hours) at 06/09/2024 0755 Last data filed at 06/08/2024 1900 Gross per 24 hour  Intake 840 ml  Output --  Net 840 ml    Intake/Output this shift: No intake/output data recorded.  Labs: No results for input(s): HGB in the last 72 hours.  No results for input(s): WBC, RBC, HCT, PLT in the last 72 hours.  No results for input(s): NA, K, CL, CO2, BUN, CREATININE, GLUCOSE, CALCIUM  in the last 72 hours.  No results for input(s): LABPT, INR in the last 72 hours.    EXAM General - Patient sleeping well, denies any pain this AM. Extremity - ABD soft Sensation intact distally Dorsiflexion/Plantar flexion intact Dressing/Incision - Knee ROM brace intact to the left leg. No drainage noted.  Leg soft to palpation. Brace removed, honeycomb dressings intact without drainage noted. Motor Function - intact, moving foot and toes well on exam.  Abdomen soft with intact bowel sounds this AM.  Past Medical History:  Diagnosis Date   Arthritis    BP (high blood pressure) 08/21/2014   no current issues   Brain infection 2008   Breast cancer (HCC) 2001   Left side mastectomy   History of kidney stones    h/o   Hypothyroidism    Personal history of chemotherapy 2001    BREAST CA   Personal history of radiation therapy 2001   BREAST CA   Radiation 2001   BREAST CA   S/P chemotherapy, time since greater than 12 weeks 2001   BREAST CA   Tachycardia    Thyroid  disease    Tick fever    Wears contact lenses     Assessment/Plan: 5 Days Post-Op Procedure(s) (LRB): OPEN REDUCTION INTERNAL FIXATION (ORIF) DISTAL FEMUR FRACTURE (Left) Principal Problem:   Femur fracture, left (HCC)  Estimated body mass index is 23.3 kg/m as calculated from the following:   Height as of this encounter: 5' 5 (1.651 m).   Weight as of this encounter: 63.5 kg. Advance diet Up with therapy D/C IV fluids when tolerating po intake.  Vitals reviewed this AM. Continue with PT today. Continue to work on a BM. Was at assisted living prior to fall, will need SNF at discharge.  Following discharge, continue Lovenox  40mg  daily for 14 days for DVT prophylaxis. Follow-up with [facility] in 7-10 days for staple removal and x-rays of the left femur.  Ortho will sign off at this time.  DVT Prophylaxis - Lovenox  50% WB to the left leg, locked in extension for ambulation. Can unlock knee brace to work on knee flexion when non-ambulatory.  [name] Gustavo Level, PA-C [facility] 06/09/2024, 7:55 AM

## 2024-06-09 NOTE — Care Plan (Signed)
 Report given to Ozell RN at [facility].  No Ivs Daughter updated.  Questions answered, no other concerns.

## 2024-06-09 NOTE — Plan of Care (Signed)

## 2024-06-10 DIAGNOSIS — F03918 Unspecified dementia, unspecified severity, with other behavioral disturbance: Secondary | ICD-10-CM | POA: Diagnosis not present

## 2024-06-10 DIAGNOSIS — S72142A Displaced intertrochanteric fracture of left femur, initial encounter for closed fracture: Secondary | ICD-10-CM | POA: Diagnosis not present

## 2024-06-10 DIAGNOSIS — I10 Essential (primary) hypertension: Secondary | ICD-10-CM | POA: Diagnosis not present

## 2024-06-10 DIAGNOSIS — G8911 Acute pain due to trauma: Secondary | ICD-10-CM | POA: Diagnosis not present

## 2024-06-12 DIAGNOSIS — R059 Cough, unspecified: Secondary | ICD-10-CM | POA: Diagnosis not present

## 2024-06-12 DIAGNOSIS — S72142A Displaced intertrochanteric fracture of left femur, initial encounter for closed fracture: Secondary | ICD-10-CM | POA: Diagnosis not present

## 2024-06-15 DIAGNOSIS — F03918 Unspecified dementia, unspecified severity, with other behavioral disturbance: Secondary | ICD-10-CM | POA: Diagnosis not present

## 2024-06-18 DIAGNOSIS — F03918 Unspecified dementia, unspecified severity, with other behavioral disturbance: Secondary | ICD-10-CM | POA: Diagnosis not present

## 2024-06-18 DIAGNOSIS — R059 Cough, unspecified: Secondary | ICD-10-CM | POA: Diagnosis not present

## 2024-06-19 DIAGNOSIS — G309 Alzheimer's disease, unspecified: Secondary | ICD-10-CM | POA: Diagnosis not present

## 2024-06-19 DIAGNOSIS — S72142D Displaced intertrochanteric fracture of left femur, subsequent encounter for closed fracture with routine healing: Secondary | ICD-10-CM | POA: Diagnosis not present

## 2024-06-19 DIAGNOSIS — R300 Dysuria: Secondary | ICD-10-CM | POA: Diagnosis not present

## 2024-06-19 DIAGNOSIS — N182 Chronic kidney disease, stage 2 (mild): Secondary | ICD-10-CM | POA: Diagnosis not present

## 2024-06-21 DIAGNOSIS — M898X5 Other specified disorders of bone, thigh: Secondary | ICD-10-CM | POA: Diagnosis not present

## 2024-06-22 DIAGNOSIS — R300 Dysuria: Secondary | ICD-10-CM | POA: Diagnosis not present

## 2024-06-22 DIAGNOSIS — S72142D Displaced intertrochanteric fracture of left femur, subsequent encounter for closed fracture with routine healing: Secondary | ICD-10-CM | POA: Diagnosis not present

## 2024-06-25 DIAGNOSIS — R059 Cough, unspecified: Secondary | ICD-10-CM | POA: Diagnosis not present

## 2024-06-26 DIAGNOSIS — R059 Cough, unspecified: Secondary | ICD-10-CM | POA: Diagnosis not present

## 2024-06-26 DIAGNOSIS — S72142D Displaced intertrochanteric fracture of left femur, subsequent encounter for closed fracture with routine healing: Secondary | ICD-10-CM | POA: Diagnosis not present

## 2024-06-26 DIAGNOSIS — D72829 Elevated white blood cell count, unspecified: Secondary | ICD-10-CM | POA: Diagnosis not present

## 2024-06-28 DIAGNOSIS — S72142D Displaced intertrochanteric fracture of left femur, subsequent encounter for closed fracture with routine healing: Secondary | ICD-10-CM | POA: Diagnosis not present

## 2024-06-28 DIAGNOSIS — R059 Cough, unspecified: Secondary | ICD-10-CM | POA: Diagnosis not present

## 2024-06-30 DIAGNOSIS — S72142D Displaced intertrochanteric fracture of left femur, subsequent encounter for closed fracture with routine healing: Secondary | ICD-10-CM | POA: Diagnosis not present

## 2024-06-30 DIAGNOSIS — J309 Allergic rhinitis, unspecified: Secondary | ICD-10-CM | POA: Diagnosis not present

## 2024-07-04 DIAGNOSIS — S72142D Displaced intertrochanteric fracture of left femur, subsequent encounter for closed fracture with routine healing: Secondary | ICD-10-CM | POA: Diagnosis not present

## 2024-07-04 DIAGNOSIS — F03918 Unspecified dementia, unspecified severity, with other behavioral disturbance: Secondary | ICD-10-CM | POA: Diagnosis not present

## 2024-07-04 DIAGNOSIS — J309 Allergic rhinitis, unspecified: Secondary | ICD-10-CM | POA: Diagnosis not present

## 2024-07-13 DIAGNOSIS — I10 Essential (primary) hypertension: Secondary | ICD-10-CM | POA: Diagnosis not present

## 2024-07-13 DIAGNOSIS — E039 Hypothyroidism, unspecified: Secondary | ICD-10-CM | POA: Diagnosis not present

## 2024-07-13 DIAGNOSIS — F03918 Unspecified dementia, unspecified severity, with other behavioral disturbance: Secondary | ICD-10-CM | POA: Diagnosis not present

## 2024-07-13 DIAGNOSIS — S72142D Displaced intertrochanteric fracture of left femur, subsequent encounter for closed fracture with routine healing: Secondary | ICD-10-CM | POA: Diagnosis not present

## 2024-07-14 DIAGNOSIS — F03918 Unspecified dementia, unspecified severity, with other behavioral disturbance: Secondary | ICD-10-CM | POA: Diagnosis not present

## 2024-07-14 DIAGNOSIS — I10 Essential (primary) hypertension: Secondary | ICD-10-CM | POA: Diagnosis not present

## 2024-07-14 DIAGNOSIS — D649 Anemia, unspecified: Secondary | ICD-10-CM | POA: Diagnosis not present

## 2024-07-14 DIAGNOSIS — S72142D Displaced intertrochanteric fracture of left femur, subsequent encounter for closed fracture with routine healing: Secondary | ICD-10-CM | POA: Diagnosis not present

## 2024-07-19 DIAGNOSIS — G309 Alzheimer's disease, unspecified: Secondary | ICD-10-CM | POA: Diagnosis not present

## 2024-07-19 DIAGNOSIS — G4700 Insomnia, unspecified: Secondary | ICD-10-CM | POA: Diagnosis not present

## 2024-07-19 DIAGNOSIS — G3183 Dementia with Lewy bodies: Secondary | ICD-10-CM | POA: Diagnosis not present

## 2024-07-19 DIAGNOSIS — N182 Chronic kidney disease, stage 2 (mild): Secondary | ICD-10-CM | POA: Diagnosis not present

## 2024-07-21 DIAGNOSIS — Z8781 Personal history of (healed) traumatic fracture: Secondary | ICD-10-CM | POA: Diagnosis not present

## 2024-07-21 NOTE — Progress Notes (Signed)
 HPI:  Debra Patel is a 83 y.o. female who presents for follow-up now 6 weeks status post an open reduction internal fixation of a left distal femoral shaft fracture status post a prior left total knee arthroplasty as well as a prior intramedullary nailing of an intertrochanteric fracture of her left hip.  Overall, the patient feels that she is doing well.  She still notes moderate soreness in her thigh which she rates at 4/10 on today's visit, and for which she has been taking Tylenol  as necessary with temporary partial relief of her symptoms.  The patient admits that she has been taking her brace off at times and has been weightbearing on the leg while using her walker.  She is at her assisted living facility but has not been receiving physical therapy recently.  Since being transferred back to the assisted living care facility from her skilled nursing facility.  The patient denies any reinjury to the leg, and denies any fevers or chills.  Current Outpatient Medications  Medication Sig Dispense Refill  . acetaminophen  (TYLENOL ) 325 MG tablet Take 2 tablets (650 mg total) by mouth every 4 (four) hours as needed For pain, fever >101 degrees, headache (sensations) 360 tablet 11  . BENEFIBER SUGAR FREE, DEXTRIN, 1 gram Chew Take 2 Gum by mouth once daily (Benefiber Prebiotic, Probiotc Fiber supplement) 360 each 11  . cholecalciferol  (VITAMIN D3) 2,000 unit tablet Take 1 tablet (2,000 Units total) by mouth once daily 360 tablet 11  . cyanocobalamin  (VITAMIN B12) 1,000 mcg/mL injection Inject 1 mL (1,000 mcg total) into the muscle monthly 10 mL 1  . EPINEPHrine  (EPIPEN ) 0.3 mg/0.3 mL auto-injector     . FLUoxetine  (PROZAC ) 10 MG capsule Take 1 capsule (10 mg total) by mouth once daily 30 capsule 11  . fluticasone propionate (FLONASE) 50 mcg/actuation nasal spray Place 1 spray into both nostrils once daily 16 g 11  . Herbal Supplement Take 0.75 mLs by mouth at bedtime Herbal Name: Ase` CBD Oil 30 mg/  serving 30 mL of lipid 11  . risperiDONE (RISPERDAL) 0.5 MG tablet     . sennosides-docusate (SENOKOT-S) 8.6-50 mg tablet Take 2 tablets by mouth 2 (two) times daily as needed for Constipation 360 tablet 11  . SYNTHROID  50 mcg tablet Take 1 tablet (50 mcg total) by mouth once daily Take on an empty stomach with a glass of water at least 30-60 minutes before breakfast. 90 tablet 3   No current facility-administered medications for this visit.   Allergies  Allergen Reactions  . Beesix [Pyridoxine (Vitamin B6)] Anaphylaxis  . Alpha-Gal (Galactose-Alpha-1,3-Galactose) Hives and Other (See Comments)    Tick bite, sores on body  . Hydrocodone  Nausea and Vomiting  . Latex Rash    NEGATIVE Latex IgE (<0.10) on 03/29/2019  . Macrobid [Nitrofurantoin Monohyd/M-Cryst] Itching  . Nickel Rash  . Nitrofurantoin Itching  . Oxycodone  Nausea   Past Medical History:  Diagnosis Date  . Allergic state   . Allergy to alpha-gal 12/16/2017  . Anxiety   . Anxiety 10/23/2014  . Arrhythmia   . Arthritis    hands, spine  . Brain infection (HHS-HCC)   . Breast cancer (CMS/HHS-HCC) 2001   Infiltrating ductal carcinoma. Left: s/p surgery, chemo, XRT  . Breast cancer, left (CMS/HHS-HCC) 04/21/2000  . Cataract cortical, senile   . Constipation, chronic 03/05/2016  . Crohn's disease in remission (CMS/HHS-HCC)   . Diverticulosis   . Fever 03/04/2015  . Fibrocystic breast disease   . Fibromyalgia   .  Fracture of neck of femur (CMS/HHS-HCC) 09/05/2018  . GERD (gastroesophageal reflux disease)   . GERD (gastroesophageal reflux disease) 04/13/2017  . Glaucoma (increased eye pressure)   . History of nephrolithiasis    with lithotripsy  . History of palpitations   . Hypertension   . Hypothyroid    after surgery for nodule  . Lymphedema of arm    left s/p breast surgery  . Nephrolithiasis 02/23/2019  . Nephrolithiasis 02/23/2019  . Palpitations 11/24/2013  . Pelvic mass 11/15/2013   Referring Provider: Dr Thurnell Pinal  Chief Complaint: 2nd opinion regarding 3.7cm right ovarian lesion  History of Present Illness: Debra Patel is a 83 y/o G2P2, with the above CC. PMHx is significant for 1976 hysterectomy (endometriosis) and history of left breast cancer (s/p surgery, chemo-XRT in 2001). She was last seen by Dr. Pinal in late January 2015 and was given the option of watchful wa  . Pulmonary embolism (CMS/HHS-HCC) 01/2015  . 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  Followed by Dr. Arlyss  . 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  s/p Moh's.  Followed by Dr. Arlyss  . 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  Followed by Dr. Arlyss.  . Subjective memory complaints 07/27/2012  . Symptomatic PVCs    and PACs  . Syncope 06/29/2018   Past Surgical History:  Procedure Laterality Date  . MASTECTOMY Left 2001   due to ca  . THYROIDECTOMY TOTAL  2011  . shoulder surgery  12/2011  . COLONOSCOPY  09/25/2013   06/28/2002, 10/01/2006. PH Crohn's disease, repeat 10 years per Dr. Viktoria.  . EGD  09/25/2013   06/28/2002. No repeat per Dr. Viktoria.  SABRA LAPAROSCOPIC SALPINGO-OOPHORECTOMY Bilateral 11/27/2013   Procedure: ROBOTIC LAPAROSCOPIC SALPINGO-OOPHORECTOMY;  Surgeon: Webb Isidor Constable, MD;  Location: [facility] OR;  Service: Gynecology;  Laterality: Bilateral;  . REPAIR UMBILICAL HERNIA N/A 11/27/2013   Procedure: REPAIR UMBILICAL HERNIA, AGE 78 YEARS OR OLDER;  Surgeon: Webb Isidor Constable, MD;  Location: [facility] OR;  Service: Gynecology;  Laterality: N/A;  . Excision Malignant Skin Lesion Right 12/06/2015   Squamous cell from Rt lower leg.  Slow wound healing.  Followed by Dr. Arlyss.  . ORIF of a left intertrochanteric femur fracture  06/29/2018   Dr. Leora  . Left total knee arthroplasty using computer-assisted navigation  04/03/2019   Dr Mardee  . GLAUCOMA EYE SURGERY  07/23/2019  . CATARACT EXTRACTION    . HYSTERECTOMY     2/2 endometriosis  .  LITHOTRIPSY  approximately 1995  . MOHS SURGERY     Lt side of nose  . MOHS SURGERY     Low forehead/nose  . nodule    . Vein surgery      Family History  Problem Relation Name Age of Onset  . Aneurysm Father    . Headaches Father         migraines  . Dementia Sister    . Myocardial Infarction (Heart attack) Brother    . Dementia Sister    . Leukemia Sister    . Dementia Sister    . Heart valve disease Mother    . Breast cancer Neg Hx    . Clotting disorder Neg Hx    . Colon cancer Neg Hx    . Endometrial cancer (Uterus cancer) Neg Hx    . Uterine cancer Neg Hx    .  Ovarian cancer Neg Hx      Social History   Socioeconomic History  . Marital status: Widowed  . Number of children: 2  . Years of education: 13  Occupational History  . Occupation: Retired [employer] Manager  Tobacco Use  . Smoking status: Never    Passive exposure: Past  . Smokeless tobacco: Never  Vaping Use  . Vaping status: Never Used  Substance and Sexual Activity  . Alcohol  use: No    Alcohol /week: 0.0 standard drinks of alcohol   . Drug use: No  . Sexual activity: Defer    Partners: Male   Social Drivers of Health   Food Insecurity: Patient Unable To Answer (06/04/2024)   Received from [facility]   Hunger Vital Sign   . Within the past 12 months, you worried that your food would run out before you got the money to buy more.: Patient unable to answer   . Within the past 12 months, the food you bought just didn't last and you didn't have money to get more.: Patient unable to answer  Transportation Needs: Patient Unable To Answer (06/04/2024)   Received from [facility]   . In the past 12 months, has lack of transportation kept you from medical appointments or from getting medications?: Patient unable to answer   . In the past 12 months, has lack of transportation kept you from meetings, work, or from getting things needed for daily living?: Patient unable to answer     Review of Systems:  A comprehensive 14 point ROS was performed, reviewed, and the pertinent orthopaedic findings are documented in the HPI.  Physical Exam: Vitals:   07/21/24 1124  BP: 125/80  Height: 162.6 cm (5' 4)  PainSc:   4  PainLoc: Leg   General/Constitutional: The patient appears to be well-nourished, well-developed, and in no acute distress. Neuro/Psych: Confused, poor historian.  Left thigh exam: The patient's gait is not assessed on today's visit as she presents in a wheelchair.  Skin inspection of the left knee and thigh is notable for well-healed surgical incisions as well as mild swelling, but otherwise is unremarkable.  No erythema, ecchymosis, abrasions, or other skin abnormalities are identified.  She is able to perform an active straight leg raise and is able to flex her knee from 5 degrees to beyond 90 degrees.  Her patella tracks well and is without crepitus.  She also is able to tolerate internal and external rotation of her thigh without discomfort.  She is grossly neurovascular intact to the left lower extremity and foot.  X-rays/MRI/Lab data:  AP and lateral x-rays of the left femur are obtained.  These films demonstrate excellent position of all of the hardware, including her total knee replacement, intramedullary nail, and lateral femoral plate, without evidence for loosening or failure.  Early callus formation is noted.  No new acute bony abnormalities are identified.  Assessment: Encounter Diagnoses  Name Primary?  . Status post closed fracture of left femur Yes  . Closed fracture of distal end of left femur with routine healing, unspecified fracture morphology, subsequent encounter     Plan: The treatment options were discussed with the patient and her son.  In addition, patient educational materials were provided regarding the diagnosis and treatment options.  Overall, the patient and her son are pleased with her progress to this point.  I have  recommended that she resume formal physical therapy, weightbearing as tolerated on the left lower extremity with  a walker, but continue to use her hinged knee brace for support and as a reminder to avoid overdoing things.  She may gradually progress in her activities as symptoms permit, but is to avoid offending activities.  She may take over-the-counter medications as needed for discomfort.  All of the patient's questions and concerns were answered.  She can call any time with further concerns.  She will follow up in 6 weeks for re-evaluation.  Repeat x-rays of her left femur will be obtained on return.

## 2024-07-25 DIAGNOSIS — R293 Abnormal posture: Secondary | ICD-10-CM | POA: Diagnosis not present

## 2024-07-27 DIAGNOSIS — S72392D Other fracture of shaft of left femur, subsequent encounter for closed fracture with routine healing: Secondary | ICD-10-CM | POA: Diagnosis not present

## 2024-07-27 DIAGNOSIS — Z9181 History of falling: Secondary | ICD-10-CM | POA: Diagnosis not present

## 2024-07-27 DIAGNOSIS — F0280 Dementia in other diseases classified elsewhere without behavioral disturbance: Secondary | ICD-10-CM | POA: Diagnosis not present

## 2024-07-28 DIAGNOSIS — Z9181 History of falling: Secondary | ICD-10-CM | POA: Diagnosis not present

## 2024-07-28 DIAGNOSIS — F0280 Dementia in other diseases classified elsewhere without behavioral disturbance: Secondary | ICD-10-CM | POA: Diagnosis not present

## 2024-07-28 DIAGNOSIS — S72392D Other fracture of shaft of left femur, subsequent encounter for closed fracture with routine healing: Secondary | ICD-10-CM | POA: Diagnosis not present

## 2024-07-31 DIAGNOSIS — R293 Abnormal posture: Secondary | ICD-10-CM | POA: Diagnosis not present

## 2024-08-01 DIAGNOSIS — F0280 Dementia in other diseases classified elsewhere without behavioral disturbance: Secondary | ICD-10-CM | POA: Diagnosis not present

## 2024-08-01 DIAGNOSIS — R293 Abnormal posture: Secondary | ICD-10-CM | POA: Diagnosis not present

## 2024-08-01 DIAGNOSIS — S72392D Other fracture of shaft of left femur, subsequent encounter for closed fracture with routine healing: Secondary | ICD-10-CM | POA: Diagnosis not present

## 2024-08-01 DIAGNOSIS — Z9181 History of falling: Secondary | ICD-10-CM | POA: Diagnosis not present

## 2024-08-03 DIAGNOSIS — R293 Abnormal posture: Secondary | ICD-10-CM | POA: Diagnosis not present

## 2024-08-05 ENCOUNTER — Emergency Department

## 2024-08-05 ENCOUNTER — Inpatient Hospital Stay
Admission: EM | Admit: 2024-08-05 | Discharge: 2024-08-09 | DRG: 372 | Disposition: A | Discharge status: Home Health | Source: Skilled Nursing Facility | Attending: Emergency Medicine | Admitting: Emergency Medicine

## 2024-08-05 ENCOUNTER — Other Ambulatory Visit: Payer: Self-pay

## 2024-08-05 ENCOUNTER — Ambulatory Visit: Admission: EM | Admit: 2024-08-05 | Source: Home / Self Care

## 2024-08-05 DIAGNOSIS — Z91040 Latex allergy status: Secondary | ICD-10-CM

## 2024-08-05 DIAGNOSIS — Z91048 Other nonmedicinal substance allergy status: Secondary | ICD-10-CM | POA: Diagnosis not present

## 2024-08-05 DIAGNOSIS — I5032 Chronic diastolic (congestive) heart failure: Secondary | ICD-10-CM | POA: Diagnosis not present

## 2024-08-05 DIAGNOSIS — F32A Depression, unspecified: Secondary | ICD-10-CM | POA: Diagnosis not present

## 2024-08-05 DIAGNOSIS — N200 Calculus of kidney: Secondary | ICD-10-CM | POA: Diagnosis not present

## 2024-08-05 DIAGNOSIS — I700 Atherosclerosis of aorta: Secondary | ICD-10-CM | POA: Diagnosis not present

## 2024-08-05 DIAGNOSIS — Z91030 Bee allergy status: Secondary | ICD-10-CM

## 2024-08-05 DIAGNOSIS — Z79890 Hormone replacement therapy (postmenopausal): Secondary | ICD-10-CM

## 2024-08-05 DIAGNOSIS — Z923 Personal history of irradiation: Secondary | ICD-10-CM | POA: Diagnosis not present

## 2024-08-05 DIAGNOSIS — Z9221 Personal history of antineoplastic chemotherapy: Secondary | ICD-10-CM

## 2024-08-05 DIAGNOSIS — Z885 Allergy status to narcotic agent status: Secondary | ICD-10-CM | POA: Diagnosis not present

## 2024-08-05 DIAGNOSIS — F0284 Dementia in other diseases classified elsewhere, unspecified severity, with anxiety: Secondary | ICD-10-CM | POA: Diagnosis not present

## 2024-08-05 DIAGNOSIS — N6310 Unspecified lump in the right breast, unspecified quadrant: Secondary | ICD-10-CM | POA: Diagnosis present

## 2024-08-05 DIAGNOSIS — Z881 Allergy status to other antibiotic agents status: Secondary | ICD-10-CM

## 2024-08-05 DIAGNOSIS — E039 Hypothyroidism, unspecified: Secondary | ICD-10-CM | POA: Diagnosis not present

## 2024-08-05 DIAGNOSIS — Z79899 Other long term (current) drug therapy: Secondary | ICD-10-CM

## 2024-08-05 DIAGNOSIS — G3183 Dementia with Lewy bodies: Secondary | ICD-10-CM | POA: Diagnosis not present

## 2024-08-05 DIAGNOSIS — E876 Hypokalemia: Secondary | ICD-10-CM | POA: Diagnosis not present

## 2024-08-05 DIAGNOSIS — Z853 Personal history of malignant neoplasm of breast: Secondary | ICD-10-CM

## 2024-08-05 DIAGNOSIS — F418 Other specified anxiety disorders: Secondary | ICD-10-CM | POA: Diagnosis not present

## 2024-08-05 DIAGNOSIS — Z886 Allergy status to analgesic agent status: Secondary | ICD-10-CM

## 2024-08-05 DIAGNOSIS — Z96652 Presence of left artificial knee joint: Secondary | ICD-10-CM | POA: Diagnosis present

## 2024-08-05 DIAGNOSIS — Z66 Do not resuscitate: Secondary | ICD-10-CM | POA: Diagnosis not present

## 2024-08-05 DIAGNOSIS — F0283 Dementia in other diseases classified elsewhere, unspecified severity, with mood disturbance: Secondary | ICD-10-CM | POA: Diagnosis not present

## 2024-08-05 DIAGNOSIS — Z888 Allergy status to other drugs, medicaments and biological substances status: Secondary | ICD-10-CM | POA: Diagnosis not present

## 2024-08-05 DIAGNOSIS — E890 Postprocedural hypothyroidism: Secondary | ICD-10-CM | POA: Diagnosis present

## 2024-08-05 DIAGNOSIS — I110 Hypertensive heart disease with heart failure: Secondary | ICD-10-CM | POA: Diagnosis present

## 2024-08-05 DIAGNOSIS — Z9012 Acquired absence of left breast and nipple: Secondary | ICD-10-CM | POA: Diagnosis not present

## 2024-08-05 DIAGNOSIS — I10 Essential (primary) hypertension: Secondary | ICD-10-CM | POA: Diagnosis not present

## 2024-08-05 DIAGNOSIS — F0280 Dementia in other diseases classified elsewhere without behavioral disturbance: Secondary | ICD-10-CM | POA: Diagnosis present

## 2024-08-05 DIAGNOSIS — Z803 Family history of malignant neoplasm of breast: Secondary | ICD-10-CM

## 2024-08-05 DIAGNOSIS — K6389 Other specified diseases of intestine: Secondary | ICD-10-CM | POA: Diagnosis not present

## 2024-08-05 DIAGNOSIS — A0472 Enterocolitis due to Clostridium difficile, not specified as recurrent: Principal | ICD-10-CM | POA: Diagnosis present

## 2024-08-05 DIAGNOSIS — C50919 Malignant neoplasm of unspecified site of unspecified female breast: Secondary | ICD-10-CM | POA: Diagnosis present

## 2024-08-05 HISTORY — DX: Dementia in other diseases classified elsewhere, unspecified severity, without behavioral disturbance, psychotic disturbance, mood disturbance, and anxiety: F02.80

## 2024-08-05 HISTORY — DX: Essential (primary) hypertension: I10

## 2024-08-05 LAB — COMPREHENSIVE METABOLIC PANEL WITH GFR
ALT: 24 U/L (ref 0–44)
AST: 30 U/L (ref 15–41)
Albumin: 3.6 g/dL (ref 3.5–5.0)
Alkaline Phosphatase: 297 U/L — ABNORMAL HIGH (ref 38–126)
Anion gap: 11 (ref 5–15)
BUN: 11 mg/dL (ref 8–23)
CO2: 23 mmol/L (ref 22–32)
Calcium: 9.2 mg/dL (ref 8.9–10.3)
Chloride: 99 mmol/L (ref 98–111)
Creatinine, Ser: 0.72 mg/dL (ref 0.44–1.00)
GFR, Estimated: >60 mL/min (ref >60)
Glucose, Bld: 108 mg/dL — ABNORMAL HIGH (ref 70–99)
Potassium: 3.4 mmol/L — ABNORMAL LOW (ref 3.5–5.1)
Sodium: 134 mmol/L — ABNORMAL LOW (ref 135–145)
Total Bilirubin: 0.7 mg/dL (ref 0.0–1.2)
Total Protein: 6.6 g/dL (ref 6.5–8.1)

## 2024-08-05 LAB — CBC
HCT: 36.9 % (ref 36.0–46.0)
Hemoglobin: 12.1 g/dL (ref 12.0–15.0)
MCH: 28.2 pg (ref 26.0–34.0)
MCHC: 32.8 g/dL (ref 30.0–36.0)
MCV: 86 fL (ref 80.0–100.0)
Platelets: 262 K/uL (ref 150–400)
RBC: 4.29 MIL/uL (ref 3.87–5.11)
RDW: 13.9 % (ref 11.5–15.5)
WBC: 27 K/uL — ABNORMAL HIGH (ref 4.0–10.5)
nRBC: 0 % (ref 0.0–0.2)

## 2024-08-05 LAB — GASTROINTESTINAL PANEL BY PCR, STOOL (REPLACES STOOL CULTURE)

## 2024-08-05 LAB — C DIFFICILE QUICK SCREEN W PCR REFLEX
C Diff antigen: POSITIVE — AB
C Diff interpretation: DETECTED
C Diff toxin: POSITIVE — AB

## 2024-08-05 LAB — LIPASE, BLOOD: Lipase: <10 U/L — ABNORMAL LOW (ref 11–51)

## 2024-08-05 LAB — MAGNESIUM: Magnesium: 2 mg/dL (ref 1.7–2.4)

## 2024-08-05 LAB — LACTIC ACID, PLASMA: Lactic Acid, Venous: 1.2 mmol/L (ref 0.5–1.9)

## 2024-08-05 LAB — PHOSPHORUS: Phosphorus: 2.2 mg/dL — ABNORMAL LOW (ref 2.5–4.6)

## 2024-08-05 LAB — PRO BRAIN NATRIURETIC PEPTIDE: Pro Brain Natriuretic Peptide: 1265 pg/mL — ABNORMAL HIGH (ref <300.0)

## 2024-08-05 MED ORDER — SODIUM CHLORIDE 0.9 % IV SOLN: INTRAVENOUS | Status: DC

## 2024-08-05 MED ORDER — IOHEXOL 300 MG/ML  SOLN
100.0000 mL | Freq: Once | INTRAMUSCULAR | Status: AC | PRN
  Administered 2024-08-05: 100 mL via INTRAVENOUS

## 2024-08-05 MED ORDER — LORATADINE 10 MG PO TABS
10.0000 mg | ORAL_TABLET | Freq: Every day | ORAL | Status: DC
  Administered 2024-08-06 – 2024-08-09 (×4): 10 mg via ORAL
  Filled 2024-08-05 (×4): qty 1

## 2024-08-05 MED ORDER — SODIUM CHLORIDE 0.9 % IV BOLUS
1000.0000 mL | Freq: Once | INTRAVENOUS | Status: DC
  Administered 2024-08-05: 1000 mL via INTRAVENOUS

## 2024-08-05 MED ORDER — ONDANSETRON HCL 4 MG/2ML IJ SOLN: 4.0000 mg | Freq: Three times a day (TID) | INTRAMUSCULAR | Status: DC | PRN

## 2024-08-05 MED ORDER — MEMANTINE HCL 5 MG PO TABS
5.0000 mg | ORAL_TABLET | Freq: Two times a day (BID) | ORAL | Status: DC
  Administered 2024-08-06 – 2024-08-09 (×8): 5 mg via ORAL
  Filled 2024-08-05 (×8): qty 1

## 2024-08-05 MED ORDER — HYDRALAZINE HCL 20 MG/ML IJ SOLN: 5.0000 mg | INTRAMUSCULAR | Status: DC | PRN

## 2024-08-05 MED ORDER — SODIUM CHLORIDE 0.9 % IV BOLUS
500.0000 mL | Freq: Once | INTRAVENOUS | Status: AC
  Administered 2024-08-05: 500 mL via INTRAVENOUS

## 2024-08-05 MED ORDER — FE FUM-VIT C-VIT B12-FA 460-60-0.01-1 MG PO CAPS
1.0000 | ORAL_CAPSULE | Freq: Every day | ORAL | Status: DC
  Administered 2024-08-06 – 2024-08-09 (×4): 1 via ORAL
  Filled 2024-08-05 (×5): qty 1

## 2024-08-05 MED ORDER — LEVOTHYROXINE SODIUM 50 MCG PO TABS
50.0000 ug | ORAL_TABLET | Freq: Every day | ORAL | Status: DC
  Administered 2024-08-06 – 2024-08-09 (×4): 50 ug via ORAL
  Filled 2024-08-05 (×4): qty 1

## 2024-08-05 MED ORDER — SACCHAROMYCES BOULARDII 250 MG PO CAPS
250.0000 mg | ORAL_CAPSULE | Freq: Two times a day (BID) | ORAL | Status: DC
  Administered 2024-08-06 – 2024-08-09 (×8): 250 mg via ORAL
  Filled 2024-08-05 (×9): qty 1

## 2024-08-05 MED ORDER — AZELASTINE HCL 0.1 % NA SOLN: 1.0000 | Freq: Every day | NASAL | Status: DC | PRN

## 2024-08-05 MED ORDER — ENOXAPARIN SODIUM 40 MG/0.4ML IJ SOSY: 40.0000 mg | PREFILLED_SYRINGE | INTRAMUSCULAR | Status: DC

## 2024-08-05 MED ORDER — POTASSIUM CHLORIDE CRYS ER 20 MEQ PO TBCR
40.0000 meq | EXTENDED_RELEASE_TABLET | Freq: Once | ORAL | Status: AC
  Administered 2024-08-05: 40 meq via ORAL
  Filled 2024-08-05: qty 2

## 2024-08-05 MED ORDER — FLUOXETINE HCL 10 MG PO CAPS
10.0000 mg | ORAL_CAPSULE | Freq: Every day | ORAL | Status: DC
  Administered 2024-08-06 – 2024-08-09 (×4): 10 mg via ORAL
  Filled 2024-08-05 (×4): qty 1

## 2024-08-05 MED ORDER — VANCOMYCIN HCL 125 MG PO CAPS
125.0000 mg | ORAL_CAPSULE | Freq: Four times a day (QID) | ORAL | Status: DC
  Administered 2024-08-05 – 2024-08-09 (×16): 125 mg via ORAL
  Filled 2024-08-05 (×17): qty 1

## 2024-08-05 MED ORDER — ACETAMINOPHEN 325 MG PO TABS
650.0000 mg | ORAL_TABLET | Freq: Four times a day (QID) | ORAL | Status: DC | PRN
  Administered 2024-08-06: 650 mg via ORAL
  Filled 2024-08-05: qty 2

## 2024-08-05 MED ORDER — POTASSIUM PHOSPHATES 15 MMOLE/5ML IV SOLN
15.0000 mmol | Freq: Once | INTRAVENOUS | Status: AC
  Administered 2024-08-06: 15 mmol via INTRAVENOUS
  Filled 2024-08-05: qty 5

## 2024-08-05 MED ORDER — QUETIAPINE FUMARATE 25 MG PO TABS
50.0000 mg | ORAL_TABLET | Freq: Every day | ORAL | Status: DC
  Administered 2024-08-06 – 2024-08-08 (×4): 50 mg via ORAL
  Filled 2024-08-05 (×4): qty 2

## 2024-08-05 NOTE — ED Triage Notes (Signed)
 Pt to ED POV with daughter for diarrhea and concern for c diff. Recently on several abx after breaking leg. Has not been eating well. Pt lives at [facility]. Pt has Lewy body dementia. Also had recent UTI.

## 2024-08-05 NOTE — ED Notes (Signed)
MD Niu at bedside  

## 2024-08-05 NOTE — H&P (Addendum)
 History and Physical    Debra Patel FMW:[id number] DOB: 1940/10/27 DOA: 08/05/2024  Referring MD/NP/PA:   PCP: Steva Clotilda DEL, NP   Patient coming from:  The patient is coming from ALF     Chief Complaint: Diarrhea  HPI: Debra Patel is a 83 y.o. female with medical history significant of HTN, hypothyroidism, Lewy body dementia, depression, dCHF, kidney stone, breast cancer (s/p of left mastectomy, radiation and chemotherapy), as s/p of recent left hip fracture with ORIF 06/04/2024, who presents with diarrhea.  Patient was recently hospitalized from 9/14 - 9/19 due to left hip fracture.  Patient was s/p of ORIF 06/04/2024. Pt finished rehab and currently is living in assisted living facility.  Per her daughter at the bedside, after recent left hip, patient used antibiotics for UTI.  She developed watery diarrhea in the past 2 days, not sure how many times of diarrhea per day.  Patient has nausea, poor appetite, decreased oral intake, no active vomiting.  No significant abdominal pain.  No symptoms of UTI.  Patient does not have chest pain, cough, SOB.  No fever or chills.  Not sure if patient has rectal bleeding or not.  Patient states that she may have blood in stool, but her daughter said what patient is reporting may be not reliable due to dementia.  Data reviewed independently and ED Course: pt was found to have positive C. difficile antigen and positive C. difficile toxin, negative GI pathogen panel, WBC 27.0, potassium was 3.4, GFR> 60.  Temperature 99, blood pressure 108/93, heart rate 103, RR 20, oxygen saturation 96% on room air. Pt is admitted to tele bed as inpt.   CT of abdomen/pelvis: 1. Diffuse colonic wall thickening, compatible with infectious colitis given clinical history. 2. Possible 1.8 cm right breast mass, incompletely imaged on this study. If the patient would be a therapy candidate should neoplasm be detected, outpatient right breast imaging including  mammogram and ultrasound could be considered. 3. Nonobstructing left renal calculi. 4.  Aortic Atherosclerosis (ICD10-I70.0).     EKG:  Not done in ED, will get one.    Review of Systems:   General: no fevers, chills, no body weight gain, has poor appetite, has fatigue HEENT: no blurry vision, hearing changes or sore throat Respiratory: no dyspnea, coughing, wheezing CV: no chest pain, no palpitations GI: no nausea, vomiting, abdominal pain, has diarrhea GU: no dysuria, burning on urination, increased urinary frequency, hematuria  Ext: no leg edema Neuro: no unilateral weakness, numbness, or tingling, no vision change or hearing loss Skin: no rash, no skin tear. MSK: No muscle spasm, no deformity, no limitation of range of movement in spin Heme: No easy bruising.  Travel history: No recent long distant travel.   Allergy:  Allergies  Allergen Reactions   Alpha-Gal Other (See Comments), Hives and Swelling    Tick bite, sores on body  Diagnosed 10/2017   Pyridoxine Anaphylaxis   Hydrocodone  Nausea Only and Nausea And Vomiting   Bee Venom Other (See Comments)   Motrin  [Ibuprofen ] Other (See Comments)    depression   Oxycodone  Nausea Only   Latex Rash    NEGATIVE Latex IgE (<0.10) on 03/29/2019  (Develops rash/itching from compression stockings and underwear elastic)   Nickel Rash   Nitrofurantoin Itching    Past Medical History:  Diagnosis Date   Arthritis    BP (high blood pressure) 08/21/2014   no current issues   Brain infection 2008   Breast cancer (HCC) 2001  Left side mastectomy   History of kidney stones    h/o   HTN (hypertension)    Hypothyroidism    Lewy body dementia (HCC)    Personal history of chemotherapy 2001   BREAST CA   Personal history of radiation therapy 2001   BREAST CA   Radiation 2001   BREAST CA   S/P chemotherapy, time since greater than 12 weeks 2001   BREAST CA   Tachycardia    Thyroid  disease    Tick fever    Wears contact  lenses     Past Surgical History:  Procedure Laterality Date   ABDOMINAL HYSTERECTOMY     BREAST EXCISIONAL BIOPSY Right 2002   EXCISIONAL - NEG   CATARACT EXTRACTION W/PHACO Right 06/19/2019   Procedure: CATARACT EXTRACTION PHACO AND INTRAOCULAR LENS PLACEMENT (IOC) RIGHT ISTENT  00:56.3  9.0%  5.13;  Surgeon: Myrna Adine Anes, MD;  Location: [facility];  Service: Ophthalmology;  Laterality: Right;  Latex   CATARACT EXTRACTION W/PHACO Left 07/31/2019   Procedure: CATARACT EXTRACTION PHACO AND INTRAOCULAR LENS PLACEMENT (IOC) LEFT ISTENT INJ 00:48.1  12.9%  6.34;  Surgeon: Myrna Adine Anes, MD;  Location: [facility];  Service: Ophthalmology;  Laterality: Left;   INTRAMEDULLARY (IM) NAIL INTERTROCHANTERIC Left 06/29/2018   Procedure: INTRAMEDULLARY (IM) NAIL INTERTROCHANTRIC- TFNA;  Surgeon: Leora Lynwood SAUNDERS, MD;  Location: [facility];  Service: Orthopedics;  Laterality: Left;   KNEE ARTHROPLASTY Left 04/03/2019   Procedure: COMPUTER ASSISTED TOTAL KNEE ARTHROPLASTY;  Surgeon: Mardee Lynwood SQUIBB, MD;  Location: [facility];  Service: Orthopedics;  Laterality: Left;   Mass Removed from Bladder and Both Ovaries     MASTECTOMY Left 2001   BREAST CA   ORIF FEMUR FRACTURE Left 06/04/2024   Procedure: OPEN REDUCTION INTERNAL FIXATION (ORIF) DISTAL FEMUR FRACTURE;  Surgeon: Edie Norleen PARAS, MD;  Location: [facility];  Service: Orthopedics;  Laterality: Left;   THYROIDECTOMY Left     Social History:  reports that she has never smoked. She has never used smokeless tobacco. She reports that she does not drink alcohol  and does not use drugs.  Family History:  Family History  Problem Relation Age of Onset   Breast cancer Mother 31     Prior to Admission medications   Medication Sig Start Date End Date Taking? Authorizing Provider  acetaminophen  (TYLENOL ) 325 MG tablet Take 650 mg by mouth every 4 (four) hours as needed for mild pain (pain score 1-3). 01/27/23   [provider]   azelastine  (ASTELIN ) 0.1 % nasal spray Place 1 spray into both nostrils daily as needed for rhinitis. Use in each nostril as directed    [provider]  enoxaparin  (LOVENOX ) 40 MG/0.4ML injection Inject 0.4 mLs (40 mg total) into the skin daily. 06/07/24   [name], PA-C  Fe Fum-Vit C-Vit B12-FA (TRIGELS-F FORTE) CAPS capsule Take 1 capsule by mouth daily after breakfast. 06/10/24   Caleen Qualia, MD  FLUoxetine  (PROZAC ) 10 MG capsule Take 10 mg by mouth daily. 05/20/23   [provider]  lactulose  (CHRONULAC ) 10 GM/15ML solution Take 45 mLs (30 g total) by mouth daily as needed for mild constipation or moderate constipation. 06/09/24   Caleen Qualia, MD  loratadine  (CLARITIN ) 10 MG tablet Take 10 mg by mouth daily.    [provider]  memantine  (NAMENDA ) 5 MG tablet Take 5 mg by mouth 2 (two) times daily. 03/29/24   [provider]  oxyCODONE  (OXY IR/ROXICODONE ) 5 MG immediate  release tablet Take 1-2 tablets (5-10 mg total) by mouth every 4 (four) hours as needed for moderate pain (pain score 4-6). 06/07/24   [name], PA-C  QUEtiapine  (SEROQUEL ) 50 MG tablet Take 1 tablet (50 mg total) by mouth at bedtime. 06/09/24   Amin, Sumayya, MD  senna-docusate (SENOKOT-S) 8.6-50 MG tablet Take 2 tablets by mouth 2 (two) times daily as needed for mild constipation. 01/27/23   [provider]  spironolactone  (ALDACTONE ) 25 MG tablet Take 12.5 mg by mouth in the morning. 03/22/24   [provider]  SYNTHROID  50 MCG tablet Take 50 mcg by mouth daily before breakfast. 08/08/19   [provider]    Physical Exam: Vitals:   08/05/24 1546 08/05/24 1932 08/05/24 2029 08/05/24 2103  BP: (!) 108/93 107/77  116/60  Pulse:  (!) 101  97  Resp:  20  17  Temp: 99 F (37.2 C)  99.1 F (37.3 C) 98.8 F (37.1 C)  TempSrc: Oral  Oral   SpO2:  95%  95%  Weight:      Height:       General: Not in acute distress HEENT:       Eyes: PERRL,  EOMI, no jaundice       ENT: No discharge from the ears and nose, no pharynx injection, no tonsillar enlargement.        Neck: No JVD, no bruit, no mass felt. Heme: No neck lymph node enlargement. Cardiac: S1/S2, RRR, No murmurs, No gallops or rubs. Respiratory: No rales, wheezing, rhonchi or rubs. GI: Soft, nondistended, nontender, no rebound pain, no organomegaly, BS present. GU: No hematuria Ext: No pitting leg edema bilaterally. 1+DP/PT pulse bilaterally. Musculoskeletal: No joint deformities, No joint redness or warmth, no limitation of ROM in spin. Skin: No rashes.  Neuro: Alert, following command, cranial nerves II-XII grossly intact, moves all extremities normally.  Psych: Patient is not psychotic, no suicidal or hemocidal ideation.  Labs on Admission: I have personally reviewed following labs and imaging studies  CBC: Recent Labs  Lab 08/05/24 1637  WBC 27.0*  HGB 12.1  HCT 36.9  MCV 86.0  PLT 262   Basic Metabolic Panel: Recent Labs  Lab 08/05/24 1636 08/05/24 1637  NA  --  134*  K  --  3.4*  CL  --  99  CO2  --  23  GLUCOSE  --  108*  BUN  --  11  CREATININE  --  0.72  CALCIUM   --  9.2  MG 2.0  --   PHOS 2.2*  --    GFR: Estimated Creatinine Clearance: 55.9 mL/min (by C-G formula based on SCr of 0.72 mg/dL). Liver Function Tests: Recent Labs  Lab 08/05/24 1637  AST 30  ALT 24  ALKPHOS 297*  BILITOT 0.7  PROT 6.6  ALBUMIN 3.6   Recent Labs  Lab 08/05/24 1637  LIPASE <10*   No results for input(s): AMMONIA in the last 168 hours. Coagulation Profile: No results for input(s): INR, PROTIME in the last 168 hours. Cardiac Enzymes: No results for input(s): CKTOTAL, CKMB, CKMBINDEX, TROPONINI in the last 168 hours. BNP (last 3 results) Recent Labs    08/05/24 1636  PROBNP 1,265.0*   HbA1C: No results for input(s): HGBA1C in the last 72 hours. CBG: No results for input(s): GLUCAP in the last 168 hours. Lipid Profile: No  results for input(s): CHOL, HDL, LDLCALC, TRIG, CHOLHDL, LDLDIRECT in the last 72 hours. Thyroid  Function Tests: No results for  input(s): TSH, T4TOTAL, FREET4, T3FREE, THYROIDAB in the last 72 hours. Anemia Panel: No results for input(s): VITAMINB12, FOLATE, FERRITIN, TIBC, IRON, RETICCTPCT in the last 72 hours. Urine analysis:    Component Value Date/Time   COLORURINE YELLOW 04/05/2022 2212   APPEARANCEUR CLEAR 04/05/2022 2212   APPEARANCEUR Clear 10/29/2012 2338   LABSPEC 1.020 04/05/2022 2212   LABSPEC 1.005 10/29/2012 2338   PHURINE 7.0 04/05/2022 2212   GLUCOSEU NEGATIVE 04/05/2022 2212   GLUCOSEU Negative 10/29/2012 2338   HGBUR NEGATIVE 04/05/2022 2212   BILIRUBINUR NEGATIVE 04/05/2022 2212   BILIRUBINUR Negative 10/29/2012 2338   KETONESUR 40 (A) 04/05/2022 2212   PROTEINUR NEGATIVE 04/05/2022 2212   NITRITE NEGATIVE 04/05/2022 2212   LEUKOCYTESUR NEGATIVE 04/05/2022 2212   LEUKOCYTESUR Negative 10/29/2012 2338   Sepsis Labs: @LABRCNTIP (procalcitonin:4,lacticidven:4) ) Recent Results (from the past 240 hours)  C Difficile Quick Screen w PCR reflex     Status: Abnormal   Collection Time: 08/05/24  4:37 PM   Specimen: STOOL  Result Value Ref Range Status   C Diff antigen POSITIVE (A) NEGATIVE Final   C Diff toxin POSITIVE (A) NEGATIVE Final   C Diff interpretation Toxin producing C. difficile detected.  Final    Comment: CRITICAL RESULT CALLED TO, READ BACK BY AND VERIFIED WITH: JESSICA STALEY @ 08/05/2024 1847 AB Performed at [facility], [address] [place], [place] [address]   Gastrointestinal Panel by PCR , Stool     Status: None   Collection Time: 08/05/24  4:37 PM   Specimen: Stool  Result Value Ref Range Status   Campylobacter species NOT DETECTED NOT DETECTED Final   Plesimonas shigelloides NOT DETECTED NOT DETECTED Final   Salmonella species NOT DETECTED NOT DETECTED Final   Yersinia enterocolitica NOT  DETECTED NOT DETECTED Final   Vibrio species NOT DETECTED NOT DETECTED Final   Vibrio cholerae NOT DETECTED NOT DETECTED Final   Enteroaggregative E coli (EAEC) NOT DETECTED NOT DETECTED Final   Enteropathogenic E coli (EPEC) NOT DETECTED NOT DETECTED Final   Enterotoxigenic E coli (ETEC) NOT DETECTED NOT DETECTED Final   Shiga like toxin producing E coli (STEC) NOT DETECTED NOT DETECTED Final   Shigella/Enteroinvasive E coli (EIEC) NOT DETECTED NOT DETECTED Final   Cryptosporidium NOT DETECTED NOT DETECTED Final   Cyclospora cayetanensis NOT DETECTED NOT DETECTED Final   Entamoeba histolytica NOT DETECTED NOT DETECTED Final   Giardia lamblia NOT DETECTED NOT DETECTED Final   Adenovirus F40/41 NOT DETECTED NOT DETECTED Final   Astrovirus NOT DETECTED NOT DETECTED Final   Norovirus GI/GII NOT DETECTED NOT DETECTED Final   Rotavirus A NOT DETECTED NOT DETECTED Final   Sapovirus (I, II, IV, and V) NOT DETECTED NOT DETECTED Final    Comment: Performed at [facility], [address], [place], [place] [address]     Radiological Exams on Admission:   Assessment/Plan Principal Problem:   C. difficile colitis Active Problems:   HTN (hypertension)   Adult hypothyroidism   Chronic diastolic CHF (congestive heart failure) (HCC)   Lewy body dementia (HCC)   Hypokalemia   Hypophosphatemia   Breast cancer (HCC)   Mass of right breast   Depression with anxiety   Assessment and Plan:  C. difficile colitis: CT of abdomen/pelvis showed diffuse colonic wall thickening, compatible with infectious colitis given clinical history. Pt has WBC 27, no fever.  At risk of developing sepsis.  -will admit to tele bed as inpt -oral vancomycin  125 mg 4 times per  day -Give Florastor  -prn zofran  and tylenol  -IVF: 500 ml of NS --> will not give more IVF due to elevated proBNP 1265 -trend lactic acid level  HTN (hypertension) -Hold spironolactone  due to diarrhea - IV hydralazine as  needed  Adult hypothyroidism -Synthroid   Chronic diastolic CHF (congestive heart failure) (HCC): 2D echo on 10/13/2017 showed EF of 50% with grade 1 diastolic dysfunction.  Patient does not have leg edema no SOB.  Does not seem to have CHF exacerbation. -Check proBNP --> 1265 --> will not give more IVF - Hold spironolactone  due to diarreha  Lewy body dementia (HCC) -Namenda   Hypokalemia and hypophosphatemia: Potassium 3.4, phosphorus 2.2, magnesium  normal 2.0 -Repleted potassium and phosphorus  History of Breast cancer and right breat mass: Patient is s/p of left mastectomy, radiation and chemotherapy. CT of abdomen/pelvis incidentally showed a possible 1.8 cm right breast mass, incompletely imaged on this study.  - need to f/u with PCP and oncologist as outpatient workup  Depression with anxiety: - Prozac  and Seroquel     DVT ppx: SCD (not sure if pt has rectal bleeding)  Code Status: DNR per her daughter  Family Communication:  Yes, patient's daughter   at bed side.   Disposition Plan:  Anticipate discharge back to previous environment, assisted living facility  Consults called: None  Admission status and Level of care: Telemetry:  as inpt        Dispo: The patient is from: ALF              Anticipated d/c is to: ALF              Anticipated d/c date is: 2 days              Patient currently is not medically stable to d/c.    Severity of Illness:  The appropriate patient status for this patient is INPATIENT. Inpatient status is judged to be reasonable and necessary in order to provide the required intensity of service to ensure the patient's safety. The patient's presenting symptoms, physical exam findings, and initial radiographic and laboratory data in the context of their chronic comorbidities is felt to place them at high risk for further clinical deterioration. Furthermore, it is not anticipated that the patient will be medically stable for discharge from the  hospital within 2 midnights of admission.   * I certify that at the point of admission it is my clinical judgment that the patient will require inpatient hospital care spanning beyond 2 midnights from the point of admission due to high intensity of service, high risk for further deterioration and high frequency of surveillance required.*       Date of Service 08/05/2024    Caleb Exon [facility]   If 7PM-7AM, please contact night-coverage [email or web address] 08/05/2024, 9:57 PM

## 2024-08-05 NOTE — ED Provider Notes (Signed)
 [facility] Provider Note    Event Date/Time   First MD Initiated Contact with Patient 08/05/24 1552     (approximate)   History   Diarrhea and cdiff rule out   HPI  Debra Patel is a 83 y.o. female with a history of dementia and hypothyroidism who presents with diarrhea that has been going on for an unknown amount of time.  The patient is unable to give much history due to her dementia.  The daughter is the primary historian and states that the patient has been on several antibiotics recently after being hospitalized and breaking her leg.  She had a UTI as well.  She has recently been having diarrhea and not eating well.  The patient denies any abdominal pain.  She denies feeling dizzy or weak.  It is unknown how often the patient is having diarrhea, but it has noted to be malodorous.  Her providers were concerned for possible C. difficile.  I reviewed the past medical records but the patient was admitted to the hospital service in September after a left femur fracture and ORIF.   Physical Exam   Triage Vital Signs: ED Triage Vitals  Encounter Vitals Group     BP 08/05/24 1546 (!) 108/93     Girls Systolic BP Percentile --      Girls Diastolic BP Percentile --      Boys Systolic BP Percentile --      Boys Diastolic BP Percentile --      Pulse Rate 08/05/24 1544 (!) 103     Resp 08/05/24 1544 20     Temp 08/05/24 1546 99 F (37.2 C)     Temp Source 08/05/24 1544 Oral     SpO2 08/05/24 1544 96 %     Weight 08/05/24 1542 170 lb (77.1 kg)     Height 08/05/24 1542 5' 6 (1.676 m)     Head Circumference --      Peak Flow --      Pain Score --      Pain Loc --      Pain Education --      Exclude from Growth Chart --     Most recent vital signs: Vitals:   08/05/24 1544 08/05/24 1546  BP:  (!) 108/93  Pulse: (!) 103   Resp: 20   Temp:  99 F (37.2 C)  SpO2: 96%      General: Alert, well-appearing, no distress.  CV:  Good peripheral  perfusion.  Resp:  Normal effort.  Abd:  Soft with no focal tenderness.  No distention.  Other:  No jaundice or scleral icterus.  Moist mucous membranes.   ED Results / Procedures / Treatments   Labs (all labs ordered are listed, but only abnormal results are displayed) Labs Reviewed  C DIFFICILE QUICK SCREEN W PCR REFLEX   - Abnormal; Notable for the following components:      Result Value   C Diff antigen POSITIVE (*)    C Diff toxin POSITIVE (*)    All other components within normal limits  LIPASE, BLOOD - Abnormal; Notable for the following components:   Lipase <10 (*)    All other components within normal limits  COMPREHENSIVE METABOLIC PANEL WITH GFR - Abnormal; Notable for the following components:   Sodium 134 (*)    Potassium 3.4 (*)    Glucose, Bld 108 (*)    Alkaline Phosphatase 297 (*)    All other components  within normal limits  CBC - Abnormal; Notable for the following components:   WBC 27.0 (*)    All other components within normal limits  GASTROINTESTINAL PANEL BY PCR, STOOL (REPLACES STOOL CULTURE)  CULTURE, BLOOD (ROUTINE X 2)  CULTURE, BLOOD (ROUTINE X 2)  URINALYSIS, ROUTINE W REFLEX MICROSCOPIC  PRO BRAIN NATRIURETIC PEPTIDE  MAGNESIUM   PHOSPHORUS  LACTIC ACID, PLASMA  LACTIC ACID, PLASMA     EKG   RADIOLOGY  CT abdomen/pelvis: I independently viewed and interpreted the images; there are no dilated bowel loops or any free air or free fluid.  Radiology report indicates the following:  IMPRESSION:  1. Diffuse colonic wall thickening, compatible with infectious  colitis given clinical history.  2. Possible 1.8 cm right breast mass, incompletely imaged on this  study. If the patient would be a therapy candidate should neoplasm  be detected, outpatient right breast imaging including mammogram and  ultrasound could be considered.  3. Nonobstructing left renal calculi.  4.  Aortic Atherosclerosis (ICD10-I70.0).    PROCEDURES:  Critical Care  performed: No  Procedures   MEDICATIONS ORDERED IN ED: Medications  0.9 %  sodium chloride  infusion (has no administration in time range)  ondansetron  (ZOFRAN ) injection 4 mg (has no administration in time range)  hydrALAZINE (APRESOLINE) injection 5 mg (has no administration in time range)  acetaminophen  (TYLENOL ) tablet 650 mg (has no administration in time range)  potassium chloride  SA (KLOR-CON  M) CR tablet 40 mEq (has no administration in time range)  sodium chloride  0.9 % bolus 500 mL (500 mLs Intravenous New Bag/Given 08/05/24 1635)  iohexol  (OMNIPAQUE ) 300 MG/ML solution 100 mL (100 mLs Intravenous Contrast Given 08/05/24 1806)     IMPRESSION / MDM / ASSESSMENT AND PLAN / ED COURSE  I reviewed the triage vital signs and the nursing notes.  83 year old female with PMH as noted above presents with diarrhea and decreased p.o. intake.  Her providers are concerned for possible C. difficile.  On exam her heart rate is borderline elevated.  Other vital signs are normal.  Abdomen is soft and nontender.  Differential diagnosis includes, but is not limited to, C. difficile, gastroenteritis, colitis.  Given the patient's well appearance and reassuring abdominal exam, there is no indication for imaging at this time.  We will obtain lab workup including stool studies, give a fluid bolus, and reassess.  If there are concerning lab findings such as leukocytosis, we will consider imaging.  Patient's presentation is most consistent with acute complicated illness / injury requiring diagnostic workup.  ----------------------------------------- 7:21 PM on 08/05/2024 -----------------------------------------  CMP is unremarkable.  CBC did show a white count of 27 so I obtained a CT which confirms diffuse colitis consistent with C. difficile.  The C. difficile toxin is positive.  Given her age, the high white count, borderline tachycardia, and elevated risk, the patient will benefit from inpatient  admission for further management.  I consulted Dr. Hilma from the hospitalist service; based on our discussion he agreed to evaluate patient for admission.   FINAL CLINICAL IMPRESSION(S) / ED DIAGNOSES   Final diagnoses:  C. difficile colitis     Rx / DC Orders   ED Discharge Orders     None        Note:  This document was prepared using Dragon voice recognition software and may include unintentional dictation errors.    Jacolyn Pae, MD 08/05/24 [facility]

## 2024-08-05 NOTE — ED Notes (Addendum)
 Stool sample walked down to lab at this time. Blood work collected and sent.

## 2024-08-06 DIAGNOSIS — A0472 Enterocolitis due to Clostridium difficile, not specified as recurrent: Secondary | ICD-10-CM | POA: Diagnosis not present

## 2024-08-06 LAB — CBC
HCT: 33.4 % — ABNORMAL LOW (ref 36.0–46.0)
Hemoglobin: 11.1 g/dL — ABNORMAL LOW (ref 12.0–15.0)
MCH: 28.5 pg (ref 26.0–34.0)
MCHC: 33.2 g/dL (ref 30.0–36.0)
MCV: 85.6 fL (ref 80.0–100.0)
Platelets: 217 K/uL (ref 150–400)
RBC: 3.9 MIL/uL (ref 3.87–5.11)
RDW: 13.9 % (ref 11.5–15.5)
WBC: 25.2 K/uL — ABNORMAL HIGH (ref 4.0–10.5)
nRBC: 0 % (ref 0.0–0.2)

## 2024-08-06 LAB — BASIC METABOLIC PANEL WITH GFR
Anion gap: 13 (ref 5–15)
BUN: 8 mg/dL (ref 8–23)
CO2: 16 mmol/L — ABNORMAL LOW (ref 22–32)
Calcium: 8.3 mg/dL — ABNORMAL LOW (ref 8.9–10.3)
Chloride: 104 mmol/L (ref 98–111)
Creatinine, Ser: 0.61 mg/dL (ref 0.44–1.00)
GFR, Estimated: >60 mL/min (ref >60)
Glucose, Bld: 123 mg/dL — ABNORMAL HIGH (ref 70–99)
Potassium: 4 mmol/L (ref 3.5–5.1)
Sodium: 133 mmol/L — ABNORMAL LOW (ref 135–145)

## 2024-08-06 MED ORDER — ENOXAPARIN SODIUM 40 MG/0.4ML IJ SOSY
40.0000 mg | PREFILLED_SYRINGE | INTRAMUSCULAR | Status: DC
  Administered 2024-08-06 – 2024-08-09 (×4): 40 mg via SUBCUTANEOUS
  Filled 2024-08-06 (×4): qty 0.4

## 2024-08-06 NOTE — Plan of Care (Signed)

## 2024-08-06 NOTE — Progress Notes (Signed)
 PROGRESS NOTE    Debra Patel  FMW:[id number] DOB: [birth year] DOA: 08/05/2024 PCP: Steva Clotilda DEL, NP   Assessment & Plan:   Principal Problem:   C. difficile colitis Active Problems:   HTN (hypertension)   Adult hypothyroidism   Chronic diastolic CHF (congestive heart failure) (HCC)   Lewy body dementia (HCC)   Hypokalemia   Hypophosphatemia   Breast cancer (HCC)   Mass of right breast   Depression with anxiety  Assessment and Plan: C. difficile colitis: CT of abdomen/pelvis showed diffuse colonic wall thickening, compatible with infectious colitis given clinical history. Continue on po vanco, florastor. Continue on enteric/contact precautions  HTN: holding home dose of aldactone  secondary to diarrhea. BP is WNL currently. IV hydralazine prn   Hypothyroidism: continue on home dose of levothyroxine     Chronic diastolic CHF: echo on 10/13/2017 showed EF of 50% with grade I diastolic dysfunction. Holding home aldactone . Monitor I/Os   Lewy body dementia: continue on home dose of namenda , seroquel     Hypokalemia: WNL today   Hypophosphatemia: replaced phosphorus already.   Right breat mass: hx of breast cancer. S/p of left mastectomy, radiation and chemotherapy. CT of abdomen/pelvis incidentally showed a possible 1.8 cm right breast mass, incompletely imaged on this study. Will need to f/u w/ onco outpatient for further work-up    Depression: severity unknown. Continue on home dose of fluoxetine         DVT prophylaxis: lovenox  Code Status: DNR Family Communication: called pt's daughter, Debra Patel, no answer so I left a voicemail  Disposition Plan: depends on PT/OT recs   Status is: Inpatient Remains inpatient appropriate because: severity of illness    Level of care: Telemetry Consultants:    Procedures:   Antimicrobials:    Subjective: Pt is pleasantly confused   Objective: Vitals:   08/06/24 0006 08/06/24 0442 08/06/24 0500 08/06/24 0823  BP:  (!) 108/56 (!) 102/91  90/65  Pulse: 98 93  97  Resp: 18 18  17   Temp: 98.6 F (37 C) 98.6 F (37 C)  97.6 F (36.4 C)  TempSrc:  Oral    SpO2: 93% 97%  93%  Weight:   70.2 kg   Height:        Intake/Output Summary (Last 24 hours) at 08/06/2024 0841 Last data filed at 08/06/2024 0448 Gross per 24 hour  Intake 1697.52 ml  Output --  Net 1697.52 ml   Filed Weights   08/05/24 1542 08/06/24 0500  Weight: 77.1 kg 70.2 kg    Examination:  General exam: Appears comfortable but confused Respiratory system: Clear to auscultation. Respiratory effort normal. Cardiovascular system: S1 & S2+. No rubs, gallops or clicks.  Gastrointestinal system: Abdomen is nondistended, soft and nontender. Hyperactive bowel sounds heard. Central nervous system: Alert and awake.  Psychiatry: Judgement and insight appears poor. Flat mood and affect    Data Reviewed: I have personally reviewed following labs and imaging studies  CBC: Recent Labs  Lab 08/05/24 1637 08/06/24 0629  WBC 27.0* 25.2*  HGB 12.1 11.1*  HCT 36.9 33.4*  MCV 86.0 85.6  PLT 262 217   Basic Metabolic Panel: Recent Labs  Lab 08/05/24 1636 08/05/24 1637 08/06/24 0629  NA  --  134* 133*  K  --  3.4* 4.0  CL  --  99 104  CO2  --  23 16*  GLUCOSE  --  108* 123*  BUN  --  11 8  CREATININE  --  0.72 0.61  CALCIUM   --  9.2 8.3*  MG 2.0  --   --   PHOS 2.2*  --   --    GFR: Estimated Creatinine Clearance: 49.9 mL/min (by C-G formula based on SCr of 0.61 mg/dL). Liver Function Tests: Recent Labs  Lab 08/05/24 1637  AST 30  ALT 24  ALKPHOS 297*  BILITOT 0.7  PROT 6.6  ALBUMIN 3.6   Recent Labs  Lab 08/05/24 1637  LIPASE <10*   No results for input(s): AMMONIA in the last 168 hours. Coagulation Profile: No results for input(s): INR, PROTIME in the last 168 hours. Cardiac Enzymes: No results for input(s): CKTOTAL, CKMB, CKMBINDEX, TROPONINI in the last 168 hours. BNP (last 3  results) Recent Labs    08/05/24 1636  PROBNP 1,265.0*   HbA1C: No results for input(s): HGBA1C in the last 72 hours. CBG: No results for input(s): GLUCAP in the last 168 hours. Lipid Profile: No results for input(s): CHOL, HDL, LDLCALC, TRIG, CHOLHDL, LDLDIRECT in the last 72 hours. Thyroid  Function Tests: No results for input(s): TSH, T4TOTAL, FREET4, T3FREE, THYROIDAB in the last 72 hours. Anemia Panel: No results for input(s): VITAMINB12, FOLATE, FERRITIN, TIBC, IRON, RETICCTPCT in the last 72 hours. Sepsis Labs: Recent Labs  Lab 08/05/24 2147  LATICACIDVEN 1.2    Recent Results (from the past 240 hours)  C Difficile Quick Screen w PCR reflex     Status: Abnormal   Collection Time: 08/05/24  4:37 PM   Specimen: STOOL  Result Value Ref Range Status   C Diff antigen POSITIVE (A) NEGATIVE Final   C Diff toxin POSITIVE (A) NEGATIVE Final   C Diff interpretation Toxin producing C. difficile detected.  Final    Comment: CRITICAL RESULT CALLED TO, READ BACK BY AND VERIFIED WITH: JESSICA STALEY @ 08/05/2024 1847 AB Performed at [facility], [address] [place], [place] [address]   Gastrointestinal Panel by PCR , Stool     Status: None   Collection Time: 08/05/24  4:37 PM   Specimen: Stool  Result Value Ref Range Status   Campylobacter species NOT DETECTED NOT DETECTED Final   Plesimonas shigelloides NOT DETECTED NOT DETECTED Final   Salmonella species NOT DETECTED NOT DETECTED Final   Yersinia enterocolitica NOT DETECTED NOT DETECTED Final   Vibrio species NOT DETECTED NOT DETECTED Final   Vibrio cholerae NOT DETECTED NOT DETECTED Final   Enteroaggregative E coli (EAEC) NOT DETECTED NOT DETECTED Final   Enteropathogenic E coli (EPEC) NOT DETECTED NOT DETECTED Final   Enterotoxigenic E coli (ETEC) NOT DETECTED NOT DETECTED Final   Shiga like toxin producing E coli (STEC) NOT DETECTED NOT DETECTED Final    Shigella/Enteroinvasive E coli (EIEC) NOT DETECTED NOT DETECTED Final   Cryptosporidium NOT DETECTED NOT DETECTED Final   Cyclospora cayetanensis NOT DETECTED NOT DETECTED Final   Entamoeba histolytica NOT DETECTED NOT DETECTED Final   Giardia lamblia NOT DETECTED NOT DETECTED Final   Adenovirus F40/41 NOT DETECTED NOT DETECTED Final   Astrovirus NOT DETECTED NOT DETECTED Final   Norovirus GI/GII NOT DETECTED NOT DETECTED Final   Rotavirus A NOT DETECTED NOT DETECTED Final   Sapovirus (I, II, IV, and V) NOT DETECTED NOT DETECTED Final    Comment: Performed at [facility], [address] [place], [place] [address]         Radiology Studies: CT ABDOMEN PELVIS W CONTRAST Result Date: 08/05/2024 CLINICAL DATA:  Abdominal pain, diarrhea, concern for C difficile colitis, history of left breast cancer status post  mastectomy EXAM: CT ABDOMEN AND PELVIS WITH CONTRAST TECHNIQUE: Multidetector CT imaging of the abdomen and pelvis was performed using the standard protocol following bolus administration of intravenous contrast. RADIATION DOSE REDUCTION: This exam was performed according to the departmental dose-optimization program which includes automated exposure control, adjustment of the mA and/or kV according to patient size and/or use of iterative reconstruction technique. CONTRAST:  OMNIPAQUE  IOHEXOL  300 MG/ML  SOLN COMPARISON:  08/25/2013, 07/01/2018 FINDINGS: Lower chest: Prior left mastectomy. Possible right breast mass measuring 1.8 x 1.1 cm image 1/series 2, incompletely assessed due to slice selection. No acute pleural or parenchymal lung disease. Hepatobiliary: No focal liver abnormality is seen. No gallstones, gallbladder wall thickening, or biliary dilatation. Pancreas: Unremarkable. No pancreatic ductal dilatation or surrounding inflammatory changes. Spleen: Normal in size without focal abnormality. Adrenals/Urinary Tract: The adrenals are unremarkable. There are 2  nonobstructing left renal calculi, measuring 10 mm in the left renal pelvis and 8 mm in the lower pole left kidney. Stable peripelvic cysts within the left kidney. No left-sided hydronephrosis or hydroureter. The right kidney is unremarkable. Bladder is grossly normal. Stomach/Bowel: No bowel obstruction or ileus. There is marked wall thickening throughout the entirety of the colon, compatible with inflammatory or infectious colitis. Small hiatal hernia. Vascular/Lymphatic: Aortic atherosclerosis. No enlarged abdominal or pelvic lymph nodes. Reproductive: Status post hysterectomy. No adnexal masses. Other: No free fluid or free intraperitoneal gas. Small fat containing right inguinal hernia. Musculoskeletal: Prior ORIF left femur. There are no acute or destructive bony abnormalities. Reconstructed images demonstrate no additional findings. IMPRESSION: 1. Diffuse colonic wall thickening, compatible with infectious colitis given clinical history. 2. Possible 1.8 cm right breast mass, incompletely imaged on this study. If the patient would be a therapy candidate should neoplasm be detected, outpatient right breast imaging including mammogram and ultrasound could be considered. 3. Nonobstructing left renal calculi. 4.  Aortic Atherosclerosis (ICD10-I70.0). Electronically Signed   By: Ozell Daring M.D.   On: 08/05/2024 18:29        Scheduled Meds:  Fe Fum-Vit C-Vit B12-FA  1 capsule Oral Q breakfast   FLUoxetine   10 mg Oral Daily   levothyroxine   50 mcg Oral Q0600   loratadine   10 mg Oral Daily   memantine   5 mg Oral BID   QUEtiapine   50 mg Oral QHS   saccharomyces boulardii  250 mg Oral BID   vancomycin   125 mg Oral QID   Continuous Infusions:   LOS: 1 day       Anthony CHRISTELLA Pouch, MD [facility] Pager [phone]  If 7PM-7AM, please contact night-coverage [email or web address] 08/06/2024, 8:41 AM

## 2024-08-07 ENCOUNTER — Other Ambulatory Visit (HOSPITAL_COMMUNITY): Payer: Self-pay

## 2024-08-07 ENCOUNTER — Telehealth (HOSPITAL_COMMUNITY): Payer: Self-pay

## 2024-08-07 DIAGNOSIS — A0472 Enterocolitis due to Clostridium difficile, not specified as recurrent: Secondary | ICD-10-CM | POA: Diagnosis not present

## 2024-08-07 MED ORDER — HYDROXYZINE HCL 25 MG PO TABS
25.0000 mg | ORAL_TABLET | Freq: Three times a day (TID) | ORAL | Status: DC | PRN
Start: 2024-08-07 — End: 2024-08-09

## 2024-08-07 NOTE — Plan of Care (Signed)

## 2024-08-07 NOTE — Telephone Encounter (Signed)
 Pharmacy Patient Advocate Encounter  Insurance verification completed.    The patient is insured through [employer]/ [employer]. Patient has Medicare and is not eligible for a copay card, but may be able to apply for patient assistance or Medicare RX Payment Plan (Patient Must reach out to their plan, if eligible for payment plan), if available.    Ran test claim for Vancomycin  125mg  and the current 30 day co-pay is $92.47.  Ran test claim for Dificid 200mg  and the current 30 day co-pay is $1,317.45.  This test claim was processed through [facility]- copay amounts may vary at other pharmacies due to pharmacy/plan contracts, or as the patient moves through the different stages of their insurance plan.

## 2024-08-07 NOTE — Progress Notes (Signed)
 PROGRESS NOTE    Debra Patel  FMW:[id number] DOB: May 16, 1941 DOA: 08/05/2024 PCP: Steva Clotilda DEL, NP   Assessment & Plan:   Principal Problem:   C. difficile colitis Active Problems:   HTN (hypertension)   Adult hypothyroidism   Chronic diastolic CHF (congestive heart failure) (HCC)   Lewy body dementia (HCC)   Hypokalemia   Hypophosphatemia   Breast cancer (HCC)   Mass of right breast   Depression with anxiety  Assessment and Plan: C. difficile colitis: CT of abdomen/pelvis showed diffuse colonic wall thickening, compatible with infectious colitis given clinical history. WBC is still significantly but trending down. Continue on po vanco, floastor. Continue on enteric/contact precautions   HTN: continue to hold home dose aldactone  secondary to diarrhea. BP is WNL currently. IV hydralazine prn   Hypothyroidism: continue on home dose of levothyroxine     Chronic diastolic CHF: echo on 10/13/2017 showed EF of 50% with grade I diastolic dysfunction. Holding home aldactone . Monitor I/Os   Lewy body dementia: continue on home dose of namenda , seroquel    Hypokalemia: WNL today   Hypophosphatemia: replaced phosphorus already.   Right breat mass: hx of breast cancer. S/p of left mastectomy, radiation and chemotherapy. CT of abdomen/pelvis incidentally showed a possible 1.8 cm right breast mass, incompletely imaged on this study. Will need to f/u w/ onco outpatient for further work-up    Depression: severity unknown. Continue on home dose of fluoxetine        DVT prophylaxis: lovenox  Code Status: DNR Family Communication: called pt's daughter, Debra Patel, no answer again today  Disposition Plan: likely d/c back to ALF   Status is: Inpatient Remains inpatient appropriate because: severity of illness    Level of care: Telemetry Consultants:    Procedures:   Antimicrobials:    Subjective: Pt is pleasantly confused  Objective: Vitals:   08/06/24 1932 08/07/24  0410 08/07/24 0500 08/07/24 0836  BP: 120/86 114/68  109/72  Pulse: 96 94  (!) 103  Resp: 18 18  17   Temp: 98.1 F (36.7 C) 98.1 F (36.7 C)  98 F (36.7 C)  TempSrc:  Oral    SpO2: 96% 96%  97%  Weight:   68 kg   Height:       No intake or output data in the 24 hours ending 08/07/24 0902  Filed Weights   08/05/24 1542 08/06/24 0500 08/07/24 0500  Weight: 77.1 kg 70.2 kg 68 kg    Examination:  General exam: Appears comfortable  Respiratory system: Clear breath sounds b/l Cardiovascular system: S1/S2+. No rubs or clicks  Gastrointestinal system: abd is soft, NT, ND, hyperactive bowel sounds Central nervous system: alert & awake Psychiatry: judgement and insight appears poor. Flat mood and affect   Data Reviewed: I have personally reviewed following labs and imaging studies  CBC: Recent Labs  Lab 08/05/24 1637 08/06/24 0629  WBC 27.0* 25.2*  HGB 12.1 11.1*  HCT 36.9 33.4*  MCV 86.0 85.6  PLT 262 217   Basic Metabolic Panel: Recent Labs  Lab 08/05/24 1636 08/05/24 1637 08/06/24 0629  NA  --  134* 133*  K  --  3.4* 4.0  CL  --  99 104  CO2  --  23 16*  GLUCOSE  --  108* 123*  BUN  --  11 8  CREATININE  --  0.72 0.61  CALCIUM   --  9.2 8.3*  MG 2.0  --   --   PHOS 2.2*  --   --  GFR: Estimated Creatinine Clearance: 49.9 mL/min (by C-G formula based on SCr of 0.61 mg/dL). Liver Function Tests: Recent Labs  Lab 08/05/24 1637  AST 30  ALT 24  ALKPHOS 297*  BILITOT 0.7  PROT 6.6  ALBUMIN 3.6   Recent Labs  Lab 08/05/24 1637  LIPASE <10*   No results for input(s): AMMONIA in the last 168 hours. Coagulation Profile: No results for input(s): INR, PROTIME in the last 168 hours. Cardiac Enzymes: No results for input(s): CKTOTAL, CKMB, CKMBINDEX, TROPONINI in the last 168 hours. BNP (last 3 results) Recent Labs    08/05/24 1636  PROBNP 1,265.0*   HbA1C: No results for input(s): HGBA1C in the last 72 hours. CBG: No results  for input(s): GLUCAP in the last 168 hours. Lipid Profile: No results for input(s): CHOL, HDL, LDLCALC, TRIG, CHOLHDL, LDLDIRECT in the last 72 hours. Thyroid  Function Tests: No results for input(s): TSH, T4TOTAL, FREET4, T3FREE, THYROIDAB in the last 72 hours. Anemia Panel: No results for input(s): VITAMINB12, FOLATE, FERRITIN, TIBC, IRON, RETICCTPCT in the last 72 hours. Sepsis Labs: Recent Labs  Lab 08/05/24 2147  LATICACIDVEN 1.2    Recent Results (from the past 240 hours)  C Difficile Quick Screen w PCR reflex     Status: Abnormal   Collection Time: 08/05/24  4:37 PM   Specimen: STOOL  Result Value Ref Range Status   C Diff antigen POSITIVE (A) NEGATIVE Final   C Diff toxin POSITIVE (A) NEGATIVE Final   C Diff interpretation Toxin producing C. difficile detected.  Final    Comment: CRITICAL RESULT CALLED TO, READ BACK BY AND VERIFIED WITH: JESSICA STALEY @ 08/05/2024 1847 AB Performed at [facility], [address] [place], [place] [address]   Gastrointestinal Panel by PCR , Stool     Status: None   Collection Time: 08/05/24  4:37 PM   Specimen: Stool  Result Value Ref Range Status   Campylobacter species NOT DETECTED NOT DETECTED Final   Plesimonas shigelloides NOT DETECTED NOT DETECTED Final   Salmonella species NOT DETECTED NOT DETECTED Final   Yersinia enterocolitica NOT DETECTED NOT DETECTED Final   Vibrio species NOT DETECTED NOT DETECTED Final   Vibrio cholerae NOT DETECTED NOT DETECTED Final   Enteroaggregative E coli (EAEC) NOT DETECTED NOT DETECTED Final   Enteropathogenic E coli (EPEC) NOT DETECTED NOT DETECTED Final   Enterotoxigenic E coli (ETEC) NOT DETECTED NOT DETECTED Final   Shiga like toxin producing E coli (STEC) NOT DETECTED NOT DETECTED Final   Shigella/Enteroinvasive E coli (EIEC) NOT DETECTED NOT DETECTED Final   Cryptosporidium NOT DETECTED NOT DETECTED Final   Cyclospora cayetanensis NOT DETECTED  NOT DETECTED Final   Entamoeba histolytica NOT DETECTED NOT DETECTED Final   Giardia lamblia NOT DETECTED NOT DETECTED Final   Adenovirus F40/41 NOT DETECTED NOT DETECTED Final   Astrovirus NOT DETECTED NOT DETECTED Final   Norovirus GI/GII NOT DETECTED NOT DETECTED Final   Rotavirus A NOT DETECTED NOT DETECTED Final   Sapovirus (I, II, IV, and V) NOT DETECTED NOT DETECTED Final    Comment: Performed at [facility], [address] [place], [place] [address]  Culture, blood (Routine X 2) w Reflex to ID Panel     Status: None (Preliminary result)   Collection Time: 08/05/24  9:47 PM   Specimen: BLOOD  Result Value Ref Range Status   Specimen Description BLOOD BLOOD RIGHT ARM  Final   Special Requests   Final    AEROBIC  BOTTLE ONLY Blood Culture results may not be optimal due to an inadequate volume of blood received in culture bottles   Culture   Final    NO GROWTH 2 DAYS Performed at [facility], [address] [place], [place] [address]    Report Status PENDING  Incomplete  Culture, blood (Routine X 2) w Reflex to ID Panel     Status: None (Preliminary result)   Collection Time: 08/05/24  9:47 PM   Specimen: BLOOD  Result Value Ref Range Status   Specimen Description BLOOD BLOOD RIGHT HAND  Final   Special Requests   Final    AEROBIC BOTTLE ONLY Blood Culture results may not be optimal due to an inadequate volume of blood received in culture bottles   Culture   Final    NO GROWTH 2 DAYS Performed at [facility], [address], [place], [place] [address]    Report Status PENDING  Incomplete         Radiology Studies: CT ABDOMEN PELVIS W CONTRAST Result Date: 08/05/2024 CLINICAL DATA:  Abdominal pain, diarrhea, concern for C difficile colitis, history of left breast cancer status post mastectomy EXAM: CT ABDOMEN AND PELVIS WITH CONTRAST TECHNIQUE: Multidetector CT imaging of the abdomen and pelvis was performed using the standard protocol  following bolus administration of intravenous contrast. RADIATION DOSE REDUCTION: This exam was performed according to the departmental dose-optimization program which includes automated exposure control, adjustment of the mA and/or kV according to patient size and/or use of iterative reconstruction technique. CONTRAST:  OMNIPAQUE  IOHEXOL  300 MG/ML  SOLN COMPARISON:  08/25/2013, 07/01/2018 FINDINGS: Lower chest: Prior left mastectomy. Possible right breast mass measuring 1.8 x 1.1 cm image 1/series 2, incompletely assessed due to slice selection. No acute pleural or parenchymal lung disease. Hepatobiliary: No focal liver abnormality is seen. No gallstones, gallbladder wall thickening, or biliary dilatation. Pancreas: Unremarkable. No pancreatic ductal dilatation or surrounding inflammatory changes. Spleen: Normal in size without focal abnormality. Adrenals/Urinary Tract: The adrenals are unremarkable. There are 2 nonobstructing left renal calculi, measuring 10 mm in the left renal pelvis and 8 mm in the lower pole left kidney. Stable peripelvic cysts within the left kidney. No left-sided hydronephrosis or hydroureter. The right kidney is unremarkable. Bladder is grossly normal. Stomach/Bowel: No bowel obstruction or ileus. There is marked wall thickening throughout the entirety of the colon, compatible with inflammatory or infectious colitis. Small hiatal hernia. Vascular/Lymphatic: Aortic atherosclerosis. No enlarged abdominal or pelvic lymph nodes. Reproductive: Status post hysterectomy. No adnexal masses. Other: No free fluid or free intraperitoneal gas. Small fat containing right inguinal hernia. Musculoskeletal: Prior ORIF left femur. There are no acute or destructive bony abnormalities. Reconstructed images demonstrate no additional findings. IMPRESSION: 1. Diffuse colonic wall thickening, compatible with infectious colitis given clinical history. 2. Possible 1.8 cm right breast mass, incompletely imaged  on this study. If the patient would be a therapy candidate should neoplasm be detected, outpatient right breast imaging including mammogram and ultrasound could be considered. 3. Nonobstructing left renal calculi. 4.  Aortic Atherosclerosis (ICD10-I70.0). Electronically Signed   By: Ozell Daring M.D.   On: 08/05/2024 18:29        Scheduled Meds:  enoxaparin  (LOVENOX ) injection  40 mg Subcutaneous Q24H   Fe Fum-Vit C-Vit B12-FA  1 capsule Oral Q breakfast   FLUoxetine   10 mg Oral Daily   levothyroxine   50 mcg Oral Q0600   loratadine   10 mg Oral Daily   memantine   5 mg  Oral BID   QUEtiapine   50 mg Oral QHS   saccharomyces boulardii  250 mg Oral BID   vancomycin   125 mg Oral QID   Continuous Infusions:   LOS: 2 days       Anthony CHRISTELLA Pouch, MD [facility] Pager [phone]  If 7PM-7AM, please contact night-coverage [email or web address] 08/07/2024, 9:02 AM

## 2024-08-07 NOTE — Plan of Care (Signed)
  Problem: Clinical Measurements: Goal: Diagnostic test results will improve Outcome: Progressing   Problem: Nutrition: Goal: Adequate nutrition will be maintained Outcome: Progressing   Problem: Pain Managment: Goal: General experience of comfort will improve and/or be controlled Outcome: Progressing   Problem: Safety: Goal: Ability to remain free from injury will improve Outcome: Progressing   Problem: Skin Integrity: Goal: Risk for impaired skin integrity will decrease Outcome: Progressing

## 2024-08-07 NOTE — Evaluation (Signed)
 Physical Therapy Evaluation Patient Details Name: Debra Patel MRN: [id number] DOB: [birth year] Today's Date: 08/07/2024  History of Present Illness  Pt is an 83 y.o. female who presents with diarrhea. Current MD assessment: C diff colitis. Of note, pt with recent left hip fracture with ORIF 06/04/2024 and recent UTI, went to rehab and living at [facility] now. PMH of HTN, hypothyroidism, Lewy body dementia, depression, dCHF, kidney stone, breast cancer (s/p of left mastectomy, radiation and chemotherapy).   Clinical Impression  Pt alert and pleasantly confused, anxious throughout session and perseverating on wanting to go home. At most recent baseline, pt reports using RW for ambulation for the past 6 weeks, has required assistance from staff at ALF for ADLs. Pt was met sitting in recliner, able to perform STS transfer x2 and stand-pivot transfer to/from bed with close CGA, VC throughout for hand placement and RW positioning for safety. Pt able to amb ~19ft around room with CGA, no LOB throughout with mild decreased stance time on LLE but no obvious L knee instability/buckling. Pt educated on use of call bell to call for assistance when ambulating in room, pt showed fair carryover with teach-back technique. Pt was left sitting in recliner at end of session with all needs in reach. Pt would benefit from skilled PT intervention to address listed deficits (see PT Problem List) and allow for safe return to PLOF.      If plan is discharge home, recommend the following: A little help with walking and/or transfers;A little help with bathing/dressing/bathroom;Assist for transportation;Supervision due to cognitive status   Can travel by private vehicle        Equipment Recommendations None recommended by PT  Recommendations for Other Services       Functional Status Assessment Patient has had a recent decline in their functional status and demonstrates the ability to make significant  improvements in function in a reasonable and predictable amount of time.     Precautions / Restrictions Precautions Precautions: Fall Recall of Precautions/Restrictions: Impaired Precaution/Restrictions Comments: needs frequent reminders to use RW/call bell for assistance Restrictions Weight Bearing Restrictions Per Provider Order: Yes LLE Weight Bearing Per Provider Order: Weight bearing as tolerated Other Position/Activity Restrictions: Per secure chat with Dr. Edie: WBAT on LLE, brace not needed unless noticeable L knee buckling      Mobility  Bed Mobility                    Transfers Overall transfer level: Needs assistance Equipment used: Rolling walker (2 wheels) Transfers: Sit to/from Stand, Bed to chair/wheelchair/BSC Sit to Stand: Contact guard assist Stand pivot transfers: Contact guard assist         General transfer comment: Pt performed 2 STS from recliner with CGA, one stand-pivot transfer recliner <> bed with close CGA. VC for Rw positioning and hand placement during transfers for max stability    Ambulation/Gait Ambulation/Gait assistance: Contact guard assist Gait Distance (Feet): 30 Feet Assistive device: Rolling walker (2 wheels) Gait Pattern/deviations: Step-through pattern, Decreased stance time - left, Narrow base of support       General Gait Details: No LOB throughout, distance self-limited by patient citing not wanting to over do it. Steady cadence throughout but required VC for RW positioning to maintain BOS within RW frame.  Stairs            Wheelchair Mobility     Tilt Bed    Modified Rankin (Stroke Patients Only)  Balance Overall balance assessment: Needs assistance Sitting-balance support: Feet supported Sitting balance-Leahy Scale: Good Sitting balance - Comments: steady static and dynamic sitting   Standing balance support: Bilateral upper extremity supported, No upper extremity supported Standing  balance-Leahy Scale: Fair Standing balance comment: able to stand and pick up object from ground with CGA, more steady with external support                             Pertinent Vitals/Pain Pain Assessment Pain Assessment: No/denies pain    Home Living Family/patient expects to be discharged to:: Assisted living                 Home Equipment: Rolling Walker (2 wheels) Additional Comments: no equipment prior to Sept 2025 when she has L hip fx s/p ORIF, reports she began using RW after that    Prior Function Prior Level of Function : Patient poor historian/Family not available;Needs assist             Mobility Comments: Pt reports she has been using a RW for the past 6 weeks, denies falls ADLs Comments: Pt reports staff at [facility] help her with ADLs     Extremity/Trunk Assessment   Upper Extremity Assessment Upper Extremity Assessment: Defer to OT evaluation    Lower Extremity Assessment Lower Extremity Assessment: Generalized weakness    Cervical / Trunk Assessment Cervical / Trunk Assessment: Normal  Communication   Communication Communication: No apparent difficulties    Cognition Arousal: Alert Behavior During Therapy: Anxious   PT - Cognitive impairments: History of cognitive impairments, Memory, Safety/Judgement, Attention                       PT - Cognition Comments: Pt has hx of dementia, oriented to person and place, able to follow simple commands but perseverating on going home and requires frequent cues to redirect Following commands: Impaired Following commands impaired: Follows one step commands with increased time     Cueing Cueing Techniques: Verbal cues, Tactile cues, Visual cues     General Comments      Exercises     Assessment/Plan    PT Assessment Patient needs continued PT services  PT Problem List Decreased strength;Decreased range of motion;Decreased activity tolerance;Decreased balance;Decreased  mobility;Decreased cognition;Decreased safety awareness       PT Treatment Interventions DME instruction;Gait training;Stair training;Functional mobility training;Therapeutic activities;Therapeutic exercise;Balance training;Cognitive remediation;Patient/family education    PT Goals (Current goals can be found in the Care Plan section)  Acute Rehab PT Goals Patient Stated Goal: to go home PT Goal Formulation: With patient Time For Goal Achievement: 08/21/24 Potential to Achieve Goals: Fair    Frequency Min 2X/week     Co-evaluation               AM-PAC PT 6 Clicks Mobility  Outcome Measure Help needed turning from your back to your side while in a flat bed without using bedrails?: A Little Help needed moving from lying on your back to sitting on the side of a flat bed without using bedrails?: A Little Help needed moving to and from a bed to a chair (including a wheelchair)?: A Little Help needed standing up from a chair using your arms (e.g., wheelchair or bedside chair)?: A Little Help needed to walk in hospital room?: A Little Help needed climbing 3-5 steps with a railing? : A Lot 6 Click Score: 17  End of Session Equipment Utilized During Treatment: Gait belt Activity Tolerance: Patient tolerated treatment well Patient left: in chair;with call bell/phone within reach;with chair alarm set Nurse Communication: Mobility status PT Visit Diagnosis: Unsteadiness on feet (R26.81);Other abnormalities of gait and mobility (R26.89);Muscle weakness (generalized) (M62.81)    Time: 0946-1000 PT Time Calculation (min) (ACUTE ONLY): 14 min   Charges:   PT Evaluation $PT Eval Moderate Complexity: 1 Mod   PT General Charges $$ ACUTE PT VISIT: 1 Visit         Marion Seese, SPT

## 2024-08-07 NOTE — Evaluation (Signed)
 Occupational Therapy Evaluation Patient Details Name: Debra Patel MRN: [id number] DOB: 1940/11/06 Today's Date: 08/07/2024   History of Present Illness   Pt is an 83 y.o. female who presents with diarrhea. Current MD assessment: C diff colitis. Of note, pt with recent left hip fracture with ORIF 06/04/2024 and recent UTI, went to rehab and living at [facility] now. PMH of HTN, hypothyroidism, Lewy body dementia, depression, dCHF, kidney stone, breast cancer (s/p of left mastectomy, radiation and chemotherapy).     Clinical Impressions Pt was seen for OT evaluation this date. Pt is a poor historian and not able to provide much history. Per chart, she resides at [facility] since her L hip ORIF 2 months ago. Prior to that, appears pt was very active and IND walking 5 miles/day without AD. Pt presents with mild deficits in strength, balance and activity tolerance, affecting safe and optimal ADL completion. Pt currently requires CGA for transfers and ambulation using RW with cues for hand placement. Toilet transfer performed with CGA, peri-care with cues for thoroughness and proper technique seated and Min A required in standing, CGA for clothing management. Standing hand hygiene with CGA/SBA. She returned to recliner and was set up for breakfast. Pt frequently stated I want to go home, throughout session. Pt appears close to her new baseline since L hip fx in Sept, will follow acutely to maximize strength/IND to return to ALF with follow up therapies.      If plan is discharge home, recommend the following:   A little help with walking and/or transfers;A little help with bathing/dressing/bathroom;Help with stairs or ramp for entrance;Assistance with cooking/housework     Functional Status Assessment   Patient has had a recent decline in their functional status and demonstrates the ability to make significant improvements in function in a reasonable and predictable amount of  time.     Equipment Recommendations   None recommended by OT     Recommendations for Other Services         Precautions/Restrictions   Precautions Precautions: Fall Recall of Precautions/Restrictions: Impaired Restrictions Weight Bearing Restrictions Per Provider Order: No     Mobility Bed Mobility               General bed mobility comments: found seated EOB with NT present on entry    Transfers Overall transfer level: Needs assistance Equipment used: Rolling walker (2 wheels) Transfers: Sit to/from Stand Sit to Stand: Supervision, Contact guard assist           General transfer comment: ambulated using HHA intially to bathroom requiring Min A for safety, provided RW to ambulate from bathroom back to recliner with improved stability and CGA with cues for safety      Balance Overall balance assessment: Needs assistance Sitting-balance support: Feet supported Sitting balance-Leahy Scale: Good     Standing balance support: During functional activity, Single extremity supported Standing balance-Leahy Scale: Fair Standing balance comment: CGA and RW use for ambulation                           ADL either performed or assessed with clinical judgement   ADL Overall ADL's : Needs assistance/impaired     Grooming: Wash/dry hands;Standing;Contact guard assist Grooming Details (indicate cue type and reason): at sink                 Toilet Transfer: Contact guard assist;Regular Toilet;Grab bars Statistician Details (indicate  cue type and reason): cues for grab bar use Toileting- Clothing Manipulation and Hygiene: Minimal assistance;Contact guard assist;Sitting/lateral lean;Sit to/from stand Toileting - Clothing Manipulation Details (indicate cue type and reason): cont urine and BM on toilet, cues for proper toileting hygiene during peri-care seated via lateral lean; Min A for throughness with peri-care in standing after  diarrhea/c-diff     Functional mobility during ADLs: Contact guard assist;Rolling walker (2 wheels);Cueing for safety       Vision         Perception         Praxis         Pertinent Vitals/Pain Pain Assessment Pain Assessment: No/denies pain     Extremity/Trunk Assessment Upper Extremity Assessment Upper Extremity Assessment: Overall WFL for tasks assessed   Lower Extremity Assessment Lower Extremity Assessment: Generalized weakness   Cervical / Trunk Assessment Cervical / Trunk Assessment: Normal   Communication Communication Communication: No apparent difficulties   Cognition Arousal: Alert   Cognition: History of cognitive impairments             OT - Cognition Comments: lewy body dementia                 Following commands: Impaired Following commands impaired: Follows one step commands with increased time     Cueing  General Comments   Cueing Techniques: Verbal cues;Tactile cues      Exercises     Shoulder Instructions      Home Living Family/patient expects to be discharged to:: Assisted living                                 Additional Comments: no equipment prior to Sept 2025 when she has L hip fx s/p ORIF, reports she began using RW after that      Prior Functioning/Environment Prior Level of Function : Patient poor historian/Family not available             Mobility Comments: per previous chart review from 2 months ago prior to pt's L hip fx and ORIF, she was ambulatory without AD and walking 5 miles/day ADLs Comments: assist for meds/meals    OT Problem List: Decreased strength;Impaired balance (sitting and/or standing);Decreased safety awareness   OT Treatment/Interventions: Self-care/ADL training;Therapeutic exercise;Therapeutic activities;DME and/or AE instruction;Patient/family education;Balance training      OT Goals(Current goals can be found in the care plan section)   Acute Rehab OT  Goals Patient Stated Goal: go home OT Goal Formulation: With patient Time For Goal Achievement: 08/21/24 Potential to Achieve Goals: Good ADL Goals Pt Will Perform Grooming: with supervision;sitting;standing;with set-up Pt Will Perform Lower Body Dressing: with set-up;with supervision;sitting/lateral leans;sit to/from stand Pt Will Transfer to Toilet: with supervision;ambulating   OT Frequency:  Min 2X/week    Co-evaluation              AM-PAC OT 6 Clicks Daily Activity     Outcome Measure Help from another person eating meals?: A Little Help from another person taking care of personal grooming?: A Little Help from another person toileting, which includes using toliet, bedpan, or urinal?: A Little Help from another person bathing (including washing, rinsing, drying)?: A Little Help from another person to put on and taking off regular upper body clothing?: None Help from another person to put on and taking off regular lower body clothing?: A Little 6 Click Score: 19   End of Session Equipment Utilized  During Treatment: Rolling walker (2 wheels) Nurse Communication: Mobility status  Activity Tolerance: Patient tolerated treatment well Patient left: in chair;with call bell/phone within reach;with chair alarm set  OT Visit Diagnosis: Other abnormalities of gait and mobility (R26.89);Muscle weakness (generalized) (M62.81)                Time: 9161-9096 OT Time Calculation (min): 25 min Charges:  OT General Charges $OT Visit: 1 Visit OT Evaluation $OT Eval Moderate Complexity: 1 Mod OT Treatments $Self Care/Home Management : 8-22 mins  Uliana Brinker, OTR/L  08/07/24, 10:07 AM   Mareon Robinette E Nikkita Adeyemi 08/07/2024, 10:04 AM

## 2024-08-08 DIAGNOSIS — A0472 Enterocolitis due to Clostridium difficile, not specified as recurrent: Secondary | ICD-10-CM | POA: Diagnosis not present

## 2024-08-08 LAB — CBC
HCT: 33.4 % — ABNORMAL LOW (ref 36.0–46.0)
Hemoglobin: 11.1 g/dL — ABNORMAL LOW (ref 12.0–15.0)
MCH: 28 pg (ref 26.0–34.0)
MCHC: 33.2 g/dL (ref 30.0–36.0)
MCV: 84.3 fL (ref 80.0–100.0)
Platelets: 271 K/uL (ref 150–400)
RBC: 3.96 MIL/uL (ref 3.87–5.11)
RDW: 14 % (ref 11.5–15.5)
WBC: 14.7 K/uL — ABNORMAL HIGH (ref 4.0–10.5)
nRBC: 0 % (ref 0.0–0.2)

## 2024-08-08 LAB — COMPREHENSIVE METABOLIC PANEL WITH GFR
ALT: 15 U/L (ref 0–44)
AST: 16 U/L (ref 15–41)
Albumin: 3 g/dL — ABNORMAL LOW (ref 3.5–5.0)
Alkaline Phosphatase: 111 U/L (ref 38–126)
Anion gap: 10 (ref 5–15)
BUN: <5 mg/dL — ABNORMAL LOW (ref 8–23)
CO2: 24 mmol/L (ref 22–32)
Calcium: 8.6 mg/dL — ABNORMAL LOW (ref 8.9–10.3)
Chloride: 100 mmol/L (ref 98–111)
Creatinine, Ser: 0.6 mg/dL (ref 0.44–1.00)
GFR, Estimated: >60 mL/min (ref >60)
Glucose, Bld: 122 mg/dL — ABNORMAL HIGH (ref 70–99)
Potassium: 3.5 mmol/L (ref 3.5–5.1)
Sodium: 133 mmol/L — ABNORMAL LOW (ref 135–145)
Total Bilirubin: 0.5 mg/dL (ref 0.0–1.2)
Total Protein: 5.5 g/dL — ABNORMAL LOW (ref 6.5–8.1)

## 2024-08-08 NOTE — Plan of Care (Signed)
  Problem: Clinical Measurements: Goal: Diagnostic test results will improve Outcome: Progressing   Problem: Nutrition: Goal: Adequate nutrition will be maintained Outcome: Progressing   Problem: Coping: Goal: Level of anxiety will decrease Outcome: Progressing   Problem: Pain Managment: Goal: General experience of comfort will improve and/or be controlled Outcome: Progressing   Problem: Safety: Goal: Ability to remain free from injury will improve Outcome: Progressing

## 2024-08-08 NOTE — Progress Notes (Signed)
 PROGRESS NOTE   HPI was taken from Dr. Hilma: Debra Patel is a 83 y.o. female with medical history significant of HTN, hypothyroidism, Lewy body dementia, depression, dCHF, kidney stone, breast cancer (s/p of left mastectomy, radiation and chemotherapy), as s/p of recent left hip fracture with ORIF 06/04/2024, who presents with diarrhea.   Patient was recently hospitalized from 9/14 - 9/19 due to left hip fracture.  Patient was s/p of ORIF 06/04/2024. Pt finished rehab and currently is living in assisted living facility.   Per her daughter at the bedside, after recent left hip, patient used antibiotics for UTI.  She developed watery diarrhea in the past 2 days, not sure how many times of diarrhea per day.  Patient has nausea, poor appetite, decreased oral intake, no active vomiting.  No significant abdominal pain.  No symptoms of UTI.  Patient does not have chest pain, cough, SOB.  No fever or chills.  Not sure if patient has rectal bleeding or not.  Patient states that she may have blood in stool, but her daughter said what patient is reporting may be not reliable due to dementia.   Data reviewed independently and ED Course: pt was found to have positive C. difficile antigen and positive C. difficile toxin, negative GI pathogen panel, WBC 27.0, potassium was 3.4, GFR> 60.  Temperature 99, blood pressure 108/93, heart rate 103, RR 20, oxygen saturation 96% on room air. Pt is admitted to tele bed as inpt.     CT of abdomen/pelvis: 1. Diffuse colonic wall thickening, compatible with infectious colitis given clinical history. 2. Possible 1.8 cm right breast mass, incompletely imaged on this study. If the patient would be a therapy candidate should neoplasm be detected, outpatient right breast imaging including mammogram and ultrasound could be considered. 3. Nonobstructing left renal calculi. 4.  Aortic Atherosclerosis (ICD10-I70.0).     Debra Patel  FMW:[id number] DOB: 1941/02/10 DOA:  08/05/2024 PCP: Steva Clotilda DEL, NP   Assessment & Plan:   Principal Problem:   C. difficile colitis Active Problems:   HTN (hypertension)   Adult hypothyroidism   Chronic diastolic CHF (congestive heart failure) (HCC)   Lewy body dementia (HCC)   Hypokalemia   Hypophosphatemia   Breast cancer (HCC)   Mass of right breast   Depression with anxiety  Assessment and Plan: C. difficile colitis: CT of abdomen/pelvis showed diffuse colonic wall thickening, compatible with infectious colitis given clinical history. WBC is trending down again today. Still w/ multiple episodes of diarrhea as per pt's daughter at bedside. Continue on po vanco, floastor. Continue on enteric/contact precautions   HTN: holding home dose of aldactone  secondary to diarrhea. BP is WNL currently. IV hydralazine prn   Hypothyroidism: continue on home dose of diarrhea     Chronic diastolic CHF: echo on 10/13/2017 showed EF of 50% with grade I diastolic dysfunction. Holding home aldactone . Monitor I/Os   Lewy body dementia: continue on home dose of namenda , seroquel    Hypokalemia: WNL today   Hypophosphatemia: replaced phosphorus already.   Right breat mass: hx of breast cancer. S/p of left mastectomy, radiation and chemotherapy. CT of abdomen/pelvis incidentally showed a possible 1.8 cm right breast mass, incompletely imaged on this study. Will need to f/u w/ onco outpatient for further work-up    Depression: severity unknown.Continue on home dose of fluoxetine         DVT prophylaxis: lovenox  Code Status: DNR Family Communication: discussed pt's care w/ pt's daughter, [name], at bedside and answered  her questions Disposition Plan: likely d/c back to ALF   Status is: Inpatient Remains inpatient appropriate because: severity of illness, still w/ a lot of diarrhea & lives at ALF    Level of care: Telemetry    Consultants:    Procedures:   Antimicrobials: po vanco    Subjective: Pt is  pleasantly confused  Objective: Vitals:   08/08/24 0411 08/08/24 0500 08/08/24 0504 08/08/24 0809  BP: (!) 93/56  (!) 103/58 105/69  Pulse: 96  99 89  Resp: 18  16 16   Temp: 97.6 F (36.4 C)   98.4 F (36.9 C)  TempSrc: Oral   Oral  SpO2: 90%  96% 97%  Weight:  68.1 kg    Height:        Intake/Output Summary (Last 24 hours) at 08/08/2024 0844 Last data filed at 08/07/2024 0900 Gross per 24 hour  Intake 120 ml  Output --  Net 120 ml    Filed Weights   08/06/24 0500 08/07/24 0500 08/08/24 0500  Weight: 70.2 kg 68 kg 68.1 kg    Examination:  General exam: appears calm & comfortable   Respiratory system: clear breath sounds b/l  Cardiovascular system: S1 & S2+. No rubs or clicks  Gastrointestinal system: abd is soft, NT, ND & hyperactive bowel sounds Central nervous system: alert & awake  Psychiatry: judgement and insight appears poor. Flat mood and affect   Data Reviewed: I have personally reviewed following labs and imaging studies  CBC: Recent Labs  Lab 08/05/24 1637 08/06/24 0629 08/08/24 0614  WBC 27.0* 25.2* 14.7*  HGB 12.1 11.1* 11.1*  HCT 36.9 33.4* 33.4*  MCV 86.0 85.6 84.3  PLT 262 217 271   Basic Metabolic Panel: Recent Labs  Lab 08/05/24 1636 08/05/24 1637 08/06/24 0629 08/08/24 0614  NA  --  134* 133* 133*  K  --  3.4* 4.0 3.5  CL  --  99 104 100  CO2  --  23 16* 24  GLUCOSE  --  108* 123* 122*  BUN  --  11 8 <5*  CREATININE  --  0.72 0.61 0.60  CALCIUM   --  9.2 8.3* 8.6*  MG 2.0  --   --   --   PHOS 2.2*  --   --   --    GFR: Estimated Creatinine Clearance: 49.9 mL/min (by C-G formula based on SCr of 0.6 mg/dL). Liver Function Tests: Recent Labs  Lab 08/05/24 1637 08/08/24 0614  AST 30 16  ALT 24 15  ALKPHOS 297* 111  BILITOT 0.7 0.5  PROT 6.6 5.5*  ALBUMIN 3.6 3.0*   Recent Labs  Lab 08/05/24 1637  LIPASE <10*   No results for input(s): AMMONIA in the last 168 hours. Coagulation Profile: No results for input(s):  INR, PROTIME in the last 168 hours. Cardiac Enzymes: No results for input(s): CKTOTAL, CKMB, CKMBINDEX, TROPONINI in the last 168 hours. BNP (last 3 results) Recent Labs    08/05/24 1636  PROBNP 1,265.0*   HbA1C: No results for input(s): HGBA1C in the last 72 hours. CBG: No results for input(s): GLUCAP in the last 168 hours. Lipid Profile: No results for input(s): CHOL, HDL, LDLCALC, TRIG, CHOLHDL, LDLDIRECT in the last 72 hours. Thyroid  Function Tests: No results for input(s): TSH, T4TOTAL, FREET4, T3FREE, THYROIDAB in the last 72 hours. Anemia Panel: No results for input(s): VITAMINB12, FOLATE, FERRITIN, TIBC, IRON, RETICCTPCT in the last 72 hours. Sepsis Labs: Recent Labs  Lab 08/05/24 2147  LATICACIDVEN 1.2  Recent Results (from the past 240 hours)  C Difficile Quick Screen w PCR reflex     Status: Abnormal   Collection Time: 08/05/24  4:37 PM   Specimen: STOOL  Result Value Ref Range Status   C Diff antigen POSITIVE (A) NEGATIVE Final   C Diff toxin POSITIVE (A) NEGATIVE Final   C Diff interpretation Toxin producing C. difficile detected.  Final    Comment: CRITICAL RESULT CALLED TO, READ BACK BY AND VERIFIED WITH: JESSICA STALEY @ 08/05/2024 1847 AB Performed at [facility], [address] [place], [place] [address]   Gastrointestinal Panel by PCR , Stool     Status: None   Collection Time: 08/05/24  4:37 PM   Specimen: Stool  Result Value Ref Range Status   Campylobacter species NOT DETECTED NOT DETECTED Final   Plesimonas shigelloides NOT DETECTED NOT DETECTED Final   Salmonella species NOT DETECTED NOT DETECTED Final   Yersinia enterocolitica NOT DETECTED NOT DETECTED Final   Vibrio species NOT DETECTED NOT DETECTED Final   Vibrio cholerae NOT DETECTED NOT DETECTED Final   Enteroaggregative E coli (EAEC) NOT DETECTED NOT DETECTED Final   Enteropathogenic E coli (EPEC) NOT DETECTED NOT DETECTED  Final   Enterotoxigenic E coli (ETEC) NOT DETECTED NOT DETECTED Final   Shiga like toxin producing E coli (STEC) NOT DETECTED NOT DETECTED Final   Shigella/Enteroinvasive E coli (EIEC) NOT DETECTED NOT DETECTED Final   Cryptosporidium NOT DETECTED NOT DETECTED Final   Cyclospora cayetanensis NOT DETECTED NOT DETECTED Final   Entamoeba histolytica NOT DETECTED NOT DETECTED Final   Giardia lamblia NOT DETECTED NOT DETECTED Final   Adenovirus F40/41 NOT DETECTED NOT DETECTED Final   Astrovirus NOT DETECTED NOT DETECTED Final   Norovirus GI/GII NOT DETECTED NOT DETECTED Final   Rotavirus A NOT DETECTED NOT DETECTED Final   Sapovirus (I, II, IV, and V) NOT DETECTED NOT DETECTED Final    Comment: Performed at [facility], [address] [place], [place] [address]  Culture, blood (Routine X 2) w Reflex to ID Panel     Status: None (Preliminary result)   Collection Time: 08/05/24  9:47 PM   Specimen: BLOOD  Result Value Ref Range Status   Specimen Description BLOOD BLOOD RIGHT ARM  Final   Special Requests   Final    AEROBIC BOTTLE ONLY Blood Culture results may not be optimal due to an inadequate volume of blood received in culture bottles   Culture   Final    NO GROWTH 3 DAYS Performed at [facility], [address], [place], [place] [address]    Report Status PENDING  Incomplete  Culture, blood (Routine X 2) w Reflex to ID Panel     Status: None (Preliminary result)   Collection Time: 08/05/24  9:47 PM   Specimen: BLOOD  Result Value Ref Range Status   Specimen Description BLOOD BLOOD RIGHT HAND  Final   Special Requests   Final    AEROBIC BOTTLE ONLY Blood Culture results may not be optimal due to an inadequate volume of blood received in culture bottles   Culture   Final    NO GROWTH 3 DAYS Performed at [facility], [address] [place], [place] [address]    Report Status PENDING  Incomplete         Radiology Studies: No results  found.       Scheduled Meds:  enoxaparin  (LOVENOX ) injection  40 mg Subcutaneous Q24H  Fe Fum-Vit C-Vit B12-FA  1 capsule Oral Q breakfast   FLUoxetine   10 mg Oral Daily   levothyroxine   50 mcg Oral Q0600   loratadine   10 mg Oral Daily   memantine   5 mg Oral BID   QUEtiapine   50 mg Oral QHS   saccharomyces boulardii  250 mg Oral BID   vancomycin   125 mg Oral QID   Continuous Infusions:   LOS: 3 days       Anthony CHRISTELLA Pouch, MD [facility] Pager [phone]  If 7PM-7AM, please contact night-coverage [email or web address] 08/08/2024, 8:44 AM

## 2024-08-08 NOTE — Progress Notes (Signed)
 Physical Therapy Treatment Patient Details Name: Debra Patel MRN: [id number] DOB: 1940-11-26 Today's Date: 08/08/2024   History of Present Illness Pt is an 83 y.o. female who presents with diarrhea. Current MD assessment: C diff colitis. Of note, pt with recent left hip fracture with ORIF 06/04/2024 and recent UTI, went to rehab and living at [facility]F now. PMH of HTN, hypothyroidism, Lewy body dementia, depression, dCHF, kidney stone, breast cancer (s/p of left mastectomy, radiation and chemotherapy).    PT Comments  Pt alert and agreeable to participate in PT tx with max encouragement. Pt was received in bed, able to perform BLE exercises below prior to mobility with good tolerance and AROM bilaterally. Pt required minA to initiate bed mobility due to perseverating on being cold in room. STS transfer with CGA and VC for hand placement, pt required increased multimodal cuing for stand > sit transfer to manage RW positioning and controlled descent to sitting. Pt amb ~31ft around room with CGA, no LOB or buckling noted throughout, demonstrated slight R sided antalgic gait pattern with decreased WB acceptance on RLE throughout. Pt was left semi-reclined in bed at end of session with all needs in reach. The patient would benefit from further skilled PT intervention to continue to progress towards goals.      If plan is discharge home, recommend the following: A little help with walking and/or transfers;A little help with bathing/dressing/bathroom;Assist for transportation;Supervision due to cognitive status   Can travel by private vehicle        Equipment Recommendations  None recommended by PT    Recommendations for Other Services       Precautions / Restrictions Precautions Precautions: Fall Recall of Precautions/Restrictions: Impaired Precaution/Restrictions Comments: needs frequent reminders to use RW/call bell for assistance Restrictions Weight Bearing Restrictions Per  Provider Order: Yes LLE Weight Bearing Per Provider Order: Weight bearing as tolerated Other Position/Activity Restrictions: Per secure chat with Dr. Edie: WBAT on LLE, brace not needed unless noticeable L knee buckling     Mobility  Bed Mobility Overal bed mobility: Needs Assistance Bed Mobility: Supine to Sit, Sit to Supine     Supine to sit: Min assist, HOB elevated Sit to supine: Supervision, HOB elevated, Used rails   General bed mobility comments: minA to initiate bed mobility, able to return to bed with supervision and inc time/effort    Transfers Overall transfer level: Needs assistance Equipment used: Rolling walker (2 wheels) Transfers: Sit to/from Stand Sit to Stand: Contact guard assist           General transfer comment: STS from lowest bed height with CGA and VC for hand placement. Requires step-by-step sequencing for stand > sit for proper hand placement and controlled descent    Ambulation/Gait Ambulation/Gait assistance: Contact guard assist Gait Distance (Feet): 50 Feet Assistive device: Rolling walker (2 wheels) Gait Pattern/deviations: Step-through pattern, Decreased stance time - left, Narrow base of support Gait velocity: decreased     General Gait Details: No LOB or buckling throughout, slight antalgic gait pattern with decreased stance time on R   Stairs             Wheelchair Mobility     Tilt Bed    Modified Rankin (Stroke Patients Only)       Balance Overall balance assessment: Needs assistance Sitting-balance support: Feet supported Sitting balance-Leahy Scale: Good Sitting balance - Comments: steady static and dynamic sitting   Standing balance support: Bilateral upper extremity supported, Reliant on assistive device  for balance Standing balance-Leahy Scale: Fair Standing balance comment: more steady with BUE support                            Communication Communication Communication: No apparent  difficulties  Cognition Arousal: Alert Behavior During Therapy: WFL for tasks assessed/performed   PT - Cognitive impairments: History of cognitive impairments, Memory, Safety/Judgement, Attention                       PT - Cognition Comments: Hx of dementia, needed frequent encouragement to participate in mobility. Following commands: Impaired Following commands impaired: Follows one step commands with increased time    Cueing Cueing Techniques: Verbal cues, Tactile cues, Visual cues  Exercises General Exercises - Lower Extremity Ankle Circles/Pumps: AROM, Both, 10 reps, Supine Short Arc Quad: AROM, Both, 10 reps, Supine Straight Leg Raises: AROM, Both, 10 reps, Supine    General Comments        Pertinent Vitals/Pain Pain Assessment Pain Assessment: Faces Faces Pain Scale: Hurts a little bit Pain Location: R lower arm (at IV site) Pain Descriptors / Indicators: Sore Pain Intervention(s): Monitored during session, Repositioned    Home Living                          Prior Function            PT Goals (current goals can now be found in the care plan section) Progress towards PT goals: Progressing toward goals    Frequency    Min 2X/week      PT Plan      Co-evaluation              AM-PAC PT 6 Clicks Mobility   Outcome Measure  Help needed turning from your back to your side while in a flat bed without using bedrails?: A Little Help needed moving from lying on your back to sitting on the side of a flat bed without using bedrails?: A Little Help needed moving to and from a bed to a chair (including a wheelchair)?: A Little Help needed standing up from a chair using your arms (e.g., wheelchair or bedside chair)?: A Little Help needed to walk in hospital room?: A Little Help needed climbing 3-5 steps with a railing? : A Lot 6 Click Score: 17    End of Session Equipment Utilized During Treatment: Gait belt Activity Tolerance:  Patient tolerated treatment well Patient left: in bed;with call bell/phone within reach;with family/visitor present Nurse Communication: Mobility status PT Visit Diagnosis: Unsteadiness on feet (R26.81);Other abnormalities of gait and mobility (R26.89);Muscle weakness (generalized) (M62.81)     Time: 8583-8567 PT Time Calculation (min) (ACUTE ONLY): 16 min  Charges:    $Therapeutic Exercise: 8-22 mins PT General Charges $$ ACUTE PT VISIT: 1 Visit                     Makalya Nave, SPT

## 2024-08-08 NOTE — Progress Notes (Signed)
 PT Cancellation Note  Patient Details Name: STACYE NOORI MRN: [id number] DOB: October 20, 1940   Cancelled Treatment:    Reason Eval/Treat Not Completed: Other (comment): PT student attempted PT treatment session x 2 this AM with pt initially eating breakfast followed by pt declining to participate due to just having been assisted with episode of incontinence.  Will attempt to see pt at a future date/time as medically appropriate.     CHARM Glendia Bertin PT, DPT 08/08/24, 11:16 AM

## 2024-08-09 ENCOUNTER — Other Ambulatory Visit: Payer: Self-pay

## 2024-08-09 ENCOUNTER — Other Ambulatory Visit: Payer: Self-pay | Admitting: *Deleted

## 2024-08-09 ENCOUNTER — Other Ambulatory Visit (HOSPITAL_COMMUNITY): Payer: Self-pay

## 2024-08-09 DIAGNOSIS — N6310 Unspecified lump in the right breast, unspecified quadrant: Secondary | ICD-10-CM

## 2024-08-09 LAB — CBC
HCT: 33.9 % — ABNORMAL LOW (ref 36.0–46.0)
Hemoglobin: 11.4 g/dL — ABNORMAL LOW (ref 12.0–15.0)
MCH: 27.9 pg (ref 26.0–34.0)
MCHC: 33.6 g/dL (ref 30.0–36.0)
MCV: 83.1 fL (ref 80.0–100.0)
Platelets: 241 K/uL (ref 150–400)
RBC: 4.08 MIL/uL (ref 3.87–5.11)
RDW: 14.1 % (ref 11.5–15.5)
WBC: 10.9 K/uL — ABNORMAL HIGH (ref 4.0–10.5)
nRBC: 0 % (ref 0.0–0.2)

## 2024-08-09 LAB — COMPREHENSIVE METABOLIC PANEL WITH GFR
ALT: 13 U/L (ref 0–44)
AST: 19 U/L (ref 15–41)
Albumin: 2.8 g/dL — ABNORMAL LOW (ref 3.5–5.0)
Alkaline Phosphatase: 82 U/L (ref 38–126)
Anion gap: 8 (ref 5–15)
BUN: <5 mg/dL — ABNORMAL LOW (ref 8–23)
CO2: 24 mmol/L (ref 22–32)
Calcium: 8.4 mg/dL — ABNORMAL LOW (ref 8.9–10.3)
Chloride: 104 mmol/L (ref 98–111)
Creatinine, Ser: 0.67 mg/dL (ref 0.44–1.00)
GFR, Estimated: >60 mL/min (ref >60)
Glucose, Bld: 127 mg/dL — ABNORMAL HIGH (ref 70–99)
Potassium: 3.5 mmol/L (ref 3.5–5.1)
Sodium: 136 mmol/L (ref 135–145)
Total Bilirubin: 0.4 mg/dL (ref 0.0–1.2)
Total Protein: 5.4 g/dL — ABNORMAL LOW (ref 6.5–8.1)

## 2024-08-09 LAB — GLUCOSE, CAPILLARY: Glucose-Capillary: 112 mg/dL — ABNORMAL HIGH (ref 70–99)

## 2024-08-09 MED ORDER — VANCOMYCIN HCL 125 MG PO CAPS
125.0000 mg | ORAL_CAPSULE | Freq: Four times a day (QID) | ORAL | 0 refills | Status: AC
  Filled 2024-08-09: qty 28, 7d supply, fill #0

## 2024-08-09 MED ORDER — SACCHAROMYCES BOULARDII 250 MG PO CAPS
250.0000 mg | ORAL_CAPSULE | Freq: Two times a day (BID) | ORAL | 0 refills | Status: AC
  Filled 2024-08-09: qty 50, 25d supply, fill #0

## 2024-08-09 NOTE — Plan of Care (Signed)
   Problem: Elimination: Goal: Will not experience complications related to bowel motility Outcome: Progressing   Problem: Safety: Goal: Ability to remain free from injury will improve Outcome: Progressing

## 2024-08-09 NOTE — Progress Notes (Signed)
 Physical Therapy Treatment Patient Details Name: Debra Patel MRN: [id number] DOB: [birth year] Today's Date: 08/09/2024   History of Present Illness Pt is an 83 y.o. female who presents with diarrhea. Current MD assessment: C diff colitis. Of note, pt with recent left hip fracture with ORIF 06/04/2024 and recent UTI, went to rehab and living at [facility] now. PMH of HTN, hypothyroidism, Lewy body dementia, depression, dCHF, kidney stone, breast cancer (s/p of left mastectomy, radiation and chemotherapy).    PT Comments  Pt received in room standing in front of chair with chair alarm sounding. Educated on use of RW which will need to be continuous due to pt's dementia. Overall she is transferring close to baseline. No LOB or c/o pain while ambulating 133ft with RW and CG/Supervision. Will continue to progress through acute PT and Mobility Specialist.    If plan is discharge home, recommend the following: A little help with walking and/or transfers;A little help with bathing/dressing/bathroom;Assist for transportation;Supervision due to cognitive status   Can travel by private vehicle        Equipment Recommendations  None recommended by PT    Recommendations for Other Services       Precautions / Restrictions Precautions Precautions: Fall Recall of Precautions/Restrictions: Impaired Precaution/Restrictions Comments: needs frequent reminders to use RW/call bell for assistance Restrictions Weight Bearing Restrictions Per Provider Order: Yes LLE Weight Bearing Per Provider Order: Weight bearing as tolerated Other Position/Activity Restrictions: Per secure chat with Dr. Edie: WBAT on LLE, brace not needed unless noticeable L knee buckling     Mobility  Bed Mobility               General bed mobility comments:  (NT pt up in chair pre/post session)    Transfers Overall transfer level: Needs assistance Equipment used: Rolling walker (2 wheels), None Transfers: Sit  to/from Stand Sit to Stand: Supervision           General transfer comment:  (Pt able to stand from seated position with and without RW. Found in room standing with chair alarm sounding)    Ambulation/Gait Ambulation/Gait assistance: Contact guard assist, Supervision Gait Distance (Feet):  (120) Assistive device: Rolling walker (2 wheels) Gait Pattern/deviations: Step-through pattern, Decreased stance time - left, Narrow base of support Gait velocity: decreased     General Gait Details: No LOB or buckling throughout, slight antalgic gait pattern with decreased stance time on R   Stairs             Wheelchair Mobility     Tilt Bed    Modified Rankin (Stroke Patients Only)       Balance Overall balance assessment: Needs assistance Sitting-balance support: Feet supported Sitting balance-Leahy Scale: Good     Standing balance support: Bilateral upper extremity supported, Reliant on assistive device for balance Standing balance-Leahy Scale: Fair Standing balance comment: more steady with BUE support                            Communication Communication Communication: Impaired Factors Affecting Communication: Hearing impaired  Cognition Arousal: Alert Behavior During Therapy: WFL for tasks assessed/performed   PT - Cognitive impairments: History of cognitive impairments, Memory, Safety/Judgement, Attention                       PT - Cognition Comments: Hx of dementia, needed frequent encouragement to participate in mobility. Following commands: Impaired Following commands impaired: Follows  one step commands with increased time    Cueing Cueing Techniques: Verbal cues, Tactile cues, Visual cues  Exercises      General Comments General comments (skin integrity, edema, etc.): Pleasantly confused, cues to reorient to situation. Pt educated on using RW however will need continued reminders.      Pertinent Vitals/Pain Pain  Assessment Pain Assessment: No/denies pain    Home Living                          Prior Function            PT Goals (current goals can now be found in the care plan section) Acute Rehab PT Goals Patient Stated Goal: to go home Progress towards PT goals: Progressing toward goals    Frequency    Min 2X/week      PT Plan      Co-evaluation              AM-PAC PT 6 Clicks Mobility   Outcome Measure  Help needed turning from your back to your side while in a flat bed without using bedrails?: A Little Help needed moving from lying on your back to sitting on the side of a flat bed without using bedrails?: A Little Help needed moving to and from a bed to a chair (including a wheelchair)?: A Little Help needed standing up from a chair using your arms (e.g., wheelchair or bedside chair)?: A Little Help needed to walk in hospital room?: A Little Help needed climbing 3-5 steps with a railing? : A Lot 6 Click Score: 17    End of Session Equipment Utilized During Treatment: Gait belt Activity Tolerance: Patient tolerated treatment well Patient left: in chair;with call bell/phone within reach;with chair alarm set Nurse Communication: Mobility status PT Visit Diagnosis: Unsteadiness on feet (R26.81);Other abnormalities of gait and mobility (R26.89);Muscle weakness (generalized) (M62.81)     Time: 8944-8883 PT Time Calculation (min) (ACUTE ONLY): 21 min  Charges:    $Therapeutic Activity: 8-22 mins PT General Charges $$ ACUTE PT VISIT: 1 Visit                    Darice Bohr, PTA  Darice JAYSON Bohr 08/09/2024, 12:54 PM

## 2024-08-09 NOTE — [place] FL2 (Signed)
 [place]  MEDICAID FL2 LEVEL OF CARE FORM     IDENTIFICATION  Patient Name: Debra Patel Birthdate: May 03, 1941 Sex: female Admission Date (Current Location): 08/05/2024  [facility] and [place] Number:  Chiropodist and Address:  [facility], [address] [place], [place] [address]      Provider Number: [id number]  Attending Physician Name and Address:  Maree Hue, MD  Relative Name and Phone Number:       Current Level of Care: Hospital Recommended Level of Care: Assisted Living Facility Prior Approval Number:    Date Approved/Denied:   PASRR Number: [id number]  Discharge Plan: Other (Comment) (ALF)    Current Diagnoses: Patient Active Problem List   Diagnosis Date Noted   C. difficile colitis 08/05/2024   Depression with anxiety 08/05/2024   Chronic diastolic CHF (congestive heart failure) (HCC) 08/05/2024   Lewy body dementia (HCC) 08/05/2024   Breast cancer (HCC) 08/05/2024   Hypokalemia 08/05/2024   Mass of right breast 08/05/2024   Hypophosphatemia 08/05/2024   HTN (hypertension)    Fall 06/09/2024   Femur fracture, left (HCC) 06/04/2024   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   Nephrolithiasis 02/23/2019   Vertigo 10/09/2018   Fracture of neck of femur (HCC) 09/05/2018   Syncope 06/29/2018   Allergy to alpha-gal 12/16/2017   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   GERD (gastroesophageal reflux disease) 04/13/2017   Constipation, chronic 03/05/2016   Fever 03/04/2015   Febrile 03/04/2015   Chest pain with low risk for cardiac etiology 02/28/2015   History of pulmonary embolism 01/20/2015   Anxiety 10/23/2014   Amnesia 10/23/2014   Mild cognitive disorder 10/23/2014   Hypertension 08/21/2014   Primary osteoarthritis of left knee 04/06/2014   Adult hypothyroidism 11/24/2013   Palpitations 11/24/2013   Mass of pelvis 11/15/2013   Bad memory 07/27/2012   Complete rupture of rotator cuff 10/26/2011   Breast cancer,  left (HCC) 04/21/2000    Orientation RESPIRATION BLADDER Height & Weight     Self  Normal Continent Weight: 150 lb 2.1 oz (68.1 kg) Height:  5' 6 (167.6 cm)  BEHAVIORAL SYMPTOMS/MOOD NEUROLOGICAL BOWEL NUTRITION STATUS      Continent Diet (Full Liquid)  AMBULATORY STATUS COMMUNICATION OF NEEDS Skin   Extensive Assist Verbally Normal                       Personal Care Assistance Level of Assistance  Bathing, Feeding, Dressing Bathing Assistance: Limited assistance Feeding assistance: Independent Dressing Assistance: Limited assistance     Functional Limitations Info  Sight, Hearing, Speech Sight Info: Adequate Hearing Info: Adequate Speech Info: Adequate    SPECIAL CARE FACTORS FREQUENCY  PT (By licensed PT), OT (By licensed OT)     PT Frequency: 5x/week OT Frequency: 5x/week            Contractures      Additional Factors Info  Code Status, Allergies Code Status Info: Limited Allergies Info: Alpha-gal, Pyridoxine, Hydrocodone , Bee Venom, Motrin  (Ibuprofen ), Oxycodone , Latex, Nickel, Nitrofurantoin           Current Medications (08/09/2024):  This is the current hospital active medication list Current Facility-Administered Medications  Medication Dose Route Frequency Provider Last Rate Last Admin   acetaminophen  (TYLENOL ) tablet 650 mg  650 mg Oral Q6H PRN Niu, Xilin, MD   650 mg at 08/06/24 0005   azelastine  (ASTELIN ) 0.1 % nasal spray 1 spray  1 spray Each Nare Daily PRN Niu, Xilin, MD  enoxaparin  (LOVENOX ) injection 40 mg  40 mg Subcutaneous Q24H Trudy Anthony HERO, MD   40 mg at 08/08/24 1338   Fe Fum-Vit C-Vit B12-FA (TRIGELS-F FORTE) capsule 1 capsule  1 capsule Oral Q breakfast Niu, Xilin, MD   1 capsule at 08/09/24 9049   FLUoxetine  (PROZAC ) capsule 10 mg  10 mg Oral Daily Niu, Xilin, MD   10 mg at 08/09/24 0950   hydrALAZINE (APRESOLINE) injection 5 mg  5 mg Intravenous Q2H PRN Niu, Xilin, MD       hydrOXYzine (ATARAX) tablet 25 mg  25 mg  Oral TID PRN Trudy Anthony HERO, MD       levothyroxine  (SYNTHROID ) tablet 50 mcg  50 mcg Oral Q0600 Niu, Xilin, MD   50 mcg at 08/09/24 9462   loratadine  (CLARITIN ) tablet 10 mg  10 mg Oral Daily Niu, Xilin, MD   10 mg at 08/09/24 9050   memantine  (NAMENDA ) tablet 5 mg  5 mg Oral BID Niu, Xilin, MD   5 mg at 08/09/24 9049   ondansetron  (ZOFRAN ) injection 4 mg  4 mg Intravenous Q8H PRN Niu, Xilin, MD       QUEtiapine  (SEROQUEL ) tablet 50 mg  50 mg Oral QHS Niu, Xilin, MD   50 mg at 08/08/24 2143   saccharomyces boulardii (FLORASTOR) capsule 250 mg  250 mg Oral BID Niu, Xilin, MD   250 mg at 08/09/24 9048   vancomycin  (VANCOCIN ) capsule 125 mg  125 mg Oral QID Niu, Xilin, MD   125 mg at 08/09/24 9049     Discharge Medications: Please see discharge summary for a list of discharge medications.  Relevant Imaging Results:  Relevant Lab Results:   Additional Information SSN: [id number]  [name], LCSW

## 2024-08-09 NOTE — Plan of Care (Signed)

## 2024-08-09 NOTE — Care Management Important Message (Signed)
 Important Message  Patient Details  Name: Debra Patel MRN: [id number] Date of Birth: Jul 13, 1941   Important Message Given:  Yes - Medicare IM     Shirlie Enck W, CMA 08/09/2024, 2:08 PM

## 2024-08-09 NOTE — Progress Notes (Addendum)
 [facility], Room 151  [facility] Liaison Note:   (new referral for outpatient palliative services)  Notified by [facility], Alvaro Vicci HUGHS,  of patient/family request for [facility] Palliative Care services at [facility] after discharge.  Referral submitted today.    Please call with any outpatient palliative care related questions.  Thank you for the opportunity to participate in this patient's care.  Marinell Nova, [facility] Liaison [phone]

## 2024-08-09 NOTE — Progress Notes (Signed)
 Called report to the facility. IV removed. Pt is waiting for daughter to pick her up.

## 2024-08-09 NOTE — Discharge Summary (Signed)
 Physician Discharge Summary   Patient: Debra Patel MRN: [id number] DOB: Apr 25, 1941  Admit date:     08/05/2024  Discharge date: 08/09/24  Discharge Physician: Cresencio Fairly   PCP: Steva Clotilda DEL, NP   Recommendations at discharge:   Follow-up with outpatient providers as requested  Discharge Diagnoses: Principal Problem:   C. difficile colitis Active Problems:   HTN (hypertension)   Adult hypothyroidism   Chronic diastolic CHF (congestive heart failure) (HCC)   Lewy body dementia (HCC)   Hypokalemia   Hypophosphatemia   Breast cancer (HCC)   Mass of right breast   Depression with anxiety  Hospital Course: Assessment and Plan:  83 y.o. female with medical history significant of HTN, hypothyroidism, Lewy body dementia, depression, dCHF, kidney stone, breast cancer (s/p of left mastectomy, radiation and chemotherapy), as s/p of recent left hip fracture with ORIF 06/04/2024, who presents with diarrhea.   Patient was recently hospitalized from 9/14 - 9/19 due to left hip fracture.  Patient was s/p of ORIF 06/04/2024. Pt finished rehab and currently is living in assisted living facility.   Per her daughter at the bedside, after recent left hip, patient used antibiotics for UTI.  She developed watery diarrhea in the past 2 days, not sure how many times of diarrhea per day.  Patient has nausea, poor appetite, decreased oral intake, no active vomiting.  No significant abdominal pain.  No symptoms of UTI.  Patient does not have chest pain, cough, SOB.  No fever or chills.  Not sure if patient has rectal bleeding or not.  Patient states that she may have blood in stool, but her daughter said what patient is reporting may be not reliable due to dementia.   Data reviewed independently and ED Course: pt was found to have positive C. difficile antigen and positive C. difficile toxin, negative GI pathogen panel, WBC 27.0, potassium was 3.4, GFR> 60.  Temperature 99, blood pressure 108/93,  heart rate 103, RR 20, oxygen saturation 96% on room air. Pt is admitted to tele bed as inpt.     CT of abdomen/pelvis: 1. Diffuse colonic wall thickening, compatible with infectious colitis given clinical history. 2. Possible 1.8 cm right breast mass, incompletely imaged on this study. If the patient would be a therapy candidate should neoplasm be detected, outpatient right breast imaging including mammogram and ultrasound could be considered. 3. Nonobstructing left renal calculi. 4.  Aortic Atherosclerosis (ICD10-I70.0).       Debra Patel           FMW:[id number] DOB: 02/07/1941 DOA: 08/05/2024 PCP: Steva Clotilda DEL, NP             Assessment & Plan:   Principal Problem:   C. difficile colitis Active Problems:   HTN (hypertension)   Adult hypothyroidism   Chronic diastolic CHF (congestive heart failure) (HCC)   Lewy body dementia (HCC)   Hypokalemia   Hypophosphatemia   Breast cancer (HCC)   Mass of right breast   Depression with anxiety   Assessment and Plan: C. difficile colitis: CT of abdomen/pelvis showed diffuse colonic wall thickening, compatible with infectious colitis given clinical history.  Improving with vanco, floastor.    HTN: Blood pressure running soft.  Hold spironolactone  at discharge   Hypothyroidism: continue on home dose of Synthroid    Chronic diastolic CHF: echo on 10/13/2017 showed EF of 50% with grade I diastolic dysfunction. Holding home aldactone .    Lewy body dementia: continue on home meds.  Outpatient palliative  care   Hypokalemia: Repleted   Hypophosphatemia: Repleted   Right breat mass: hx of breast cancer. S/p of left mastectomy, radiation and chemotherapy. CT of abdomen/pelvis incidentally showed a possible 1.8 cm right breast mass, incompletely imaged on this study. Will need to f/u w/ onco outpatient for further work-up    Depression: Continue on home dose of fluoxetine         Disposition: Assisted living/[facility]  with outpatient palliative care Diet recommendation:  Discharge Diet Orders (From admission, onward)     Start     Ordered   08/09/24 0000  Diet - low sodium heart healthy        08/09/24 1313           Cardiac diet DISCHARGE MEDICATION: Allergies as of 08/09/2024       Reactions   Alpha-gal Other (See Comments), Hives, Swelling   Tick bite, sores on body Diagnosed 10/2017   Pyridoxine Anaphylaxis   Hydrocodone  Nausea Only, Nausea And Vomiting   Bee Venom Other (See Comments)   Motrin  [ibuprofen ] Other (See Comments)   depression   Oxycodone  Nausea Only   Latex Rash   NEGATIVE Latex IgE (<0.10) on 03/29/2019  (Develops rash/itching from compression stockings and underwear elastic)   Nickel Rash   Nitrofurantoin Itching        Medication List     STOP taking these medications    enoxaparin  40 MG/0.4ML injection Commonly known as: LOVENOX    lactulose  10 GM/15ML solution Commonly known as: CHRONULAC    oxyCODONE  5 MG immediate release tablet Commonly known as: Oxy IR/ROXICODONE    senna-docusate 8.6-50 MG tablet Commonly known as: Senokot-S   spironolactone  25 MG tablet Commonly known as: ALDACTONE    traZODone 50 MG tablet Commonly known as: DESYREL       TAKE these medications    acetaminophen  325 MG tablet Commonly known as: TYLENOL  Take 650 mg by mouth every 4 (four) hours as needed for mild pain (pain score 1-3).   azelastine  0.1 % nasal spray Commonly known as: ASTELIN  Place 1 spray into both nostrils daily as needed for rhinitis. Use in each nostril as directed   Fe Fum-Vit C-Vit B12-FA Caps capsule Commonly known as: TRIGELS-F FORTE Take 1 capsule by mouth daily after breakfast.   FLUoxetine  10 MG capsule Commonly known as: PROZAC  Take 10 mg by mouth daily.   fluticasone 50 MCG/ACT nasal spray Commonly known as: FLONASE Place 2 sprays into both nostrils daily.   loratadine  10 MG tablet Commonly known as: CLARITIN  Take 10 mg by mouth  daily.   memantine  5 MG tablet Commonly known as: NAMENDA  Take 5 mg by mouth 2 (two) times daily.   QUEtiapine  50 MG tablet Commonly known as: SEROQUEL  Take 1 tablet (50 mg total) by mouth at bedtime.   saccharomyces boulardii 250 MG capsule Commonly known as: FLORASTOR Take 1 capsule (250 mg total) by mouth 2 (two) times daily for 7 days.   Synthroid  50 MCG tablet Generic drug: levothyroxine  Take 50 mcg by mouth daily before breakfast.   vancomycin  125 MG capsule Commonly known as: VANCOCIN  Take 1 capsule (125 mg total) by mouth 4 (four) times daily for 7 days.        Discharge Exam: Filed Weights   08/07/24 0500 08/08/24 0500 08/09/24 0513  Weight: 68 kg 68.1 kg 68.1 kg   General exam: appears calm & comfortable   Respiratory system: clear breath sounds b/l  Cardiovascular system: S1 & S2+. No rubs or clicks  Gastrointestinal system: abd is soft, benign Central nervous system: alert & awake, nonfocal   Condition at discharge: fair  The results of significant diagnostics from this hospitalization (including imaging, microbiology, ancillary and laboratory) are listed below for reference.   Imaging Studies: CT ABDOMEN PELVIS W CONTRAST Result Date: 08/05/2024 CLINICAL DATA:  Abdominal pain, diarrhea, concern for C difficile colitis, history of left breast cancer status post mastectomy EXAM: CT ABDOMEN AND PELVIS WITH CONTRAST TECHNIQUE: Multidetector CT imaging of the abdomen and pelvis was performed using the standard protocol following bolus administration of intravenous contrast. RADIATION DOSE REDUCTION: This exam was performed according to the departmental dose-optimization program which includes automated exposure control, adjustment of the mA and/or kV according to patient size and/or use of iterative reconstruction technique. CONTRAST:  OMNIPAQUE  IOHEXOL  300 MG/ML  SOLN COMPARISON:  08/25/2013, 07/01/2018 FINDINGS: Lower chest: Prior left mastectomy. Possible  right breast mass measuring 1.8 x 1.1 cm image 1/series 2, incompletely assessed due to slice selection. No acute pleural or parenchymal lung disease. Hepatobiliary: No focal liver abnormality is seen. No gallstones, gallbladder wall thickening, or biliary dilatation. Pancreas: Unremarkable. No pancreatic ductal dilatation or surrounding inflammatory changes. Spleen: Normal in size without focal abnormality. Adrenals/Urinary Tract: The adrenals are unremarkable. There are 2 nonobstructing left renal calculi, measuring 10 mm in the left renal pelvis and 8 mm in the lower pole left kidney. Stable peripelvic cysts within the left kidney. No left-sided hydronephrosis or hydroureter. The right kidney is unremarkable. Bladder is grossly normal. Stomach/Bowel: No bowel obstruction or ileus. There is marked wall thickening throughout the entirety of the colon, compatible with inflammatory or infectious colitis. Small hiatal hernia. Vascular/Lymphatic: Aortic atherosclerosis. No enlarged abdominal or pelvic lymph nodes. Reproductive: Status post hysterectomy. No adnexal masses. Other: No free fluid or free intraperitoneal gas. Small fat containing right inguinal hernia. Musculoskeletal: Prior ORIF left femur. There are no acute or destructive bony abnormalities. Reconstructed images demonstrate no additional findings. IMPRESSION: 1. Diffuse colonic wall thickening, compatible with infectious colitis given clinical history. 2. Possible 1.8 cm right breast mass, incompletely imaged on this study. If the patient would be a therapy candidate should neoplasm be detected, outpatient right breast imaging including mammogram and ultrasound could be considered. 3. Nonobstructing left renal calculi. 4.  Aortic Atherosclerosis (ICD10-I70.0). Electronically Signed   By: Ozell Daring M.D.   On: 08/05/2024 18:29    Microbiology: Results for orders placed or performed during the hospital encounter of 08/05/24  C Difficile Quick  Screen w PCR reflex     Status: Abnormal   Collection Time: 08/05/24  4:37 PM   Specimen: STOOL  Result Value Ref Range Status   C Diff antigen POSITIVE (A) NEGATIVE Final   C Diff toxin POSITIVE (A) NEGATIVE Final   C Diff interpretation Toxin producing C. difficile detected.  Final    Comment: CRITICAL RESULT CALLED TO, READ BACK BY AND VERIFIED WITH: JESSICA STALEY @ 08/05/2024 1847 AB Performed at [facility], [address] [place], [place] [address]   Gastrointestinal Panel by PCR , Stool     Status: None   Collection Time: 08/05/24  4:37 PM   Specimen: Stool  Result Value Ref Range Status   Campylobacter species NOT DETECTED NOT DETECTED Final   Plesimonas shigelloides NOT DETECTED NOT DETECTED Final   Salmonella species NOT DETECTED NOT DETECTED Final   Yersinia enterocolitica NOT DETECTED NOT DETECTED Final   Vibrio species NOT DETECTED NOT DETECTED Final   Vibrio cholerae  NOT DETECTED NOT DETECTED Final   Enteroaggregative E coli (EAEC) NOT DETECTED NOT DETECTED Final   Enteropathogenic E coli (EPEC) NOT DETECTED NOT DETECTED Final   Enterotoxigenic E coli (ETEC) NOT DETECTED NOT DETECTED Final   Shiga like toxin producing E coli (STEC) NOT DETECTED NOT DETECTED Final   Shigella/Enteroinvasive E coli (EIEC) NOT DETECTED NOT DETECTED Final   Cryptosporidium NOT DETECTED NOT DETECTED Final   Cyclospora cayetanensis NOT DETECTED NOT DETECTED Final   Entamoeba histolytica NOT DETECTED NOT DETECTED Final   Giardia lamblia NOT DETECTED NOT DETECTED Final   Adenovirus F40/41 NOT DETECTED NOT DETECTED Final   Astrovirus NOT DETECTED NOT DETECTED Final   Norovirus GI/GII NOT DETECTED NOT DETECTED Final   Rotavirus A NOT DETECTED NOT DETECTED Final   Sapovirus (I, II, IV, and V) NOT DETECTED NOT DETECTED Final    Comment: Performed at [facility], [address] [place], [place] [address]  Culture, blood (Routine X 2) w Reflex to ID Panel     Status: None  (Preliminary result)   Collection Time: 08/05/24  9:47 PM   Specimen: BLOOD  Result Value Ref Range Status   Specimen Description BLOOD BLOOD RIGHT ARM  Final   Special Requests   Final    AEROBIC BOTTLE ONLY Blood Culture results may not be optimal due to an inadequate volume of blood received in culture bottles   Culture   Final    NO GROWTH 4 DAYS Performed at [facility], [address] [place], [place] [address]    Report Status PENDING  Incomplete  Culture, blood (Routine X 2) w Reflex to ID Panel     Status: None (Preliminary result)   Collection Time: 08/05/24  9:47 PM   Specimen: BLOOD  Result Value Ref Range Status   Specimen Description BLOOD BLOOD RIGHT HAND  Final   Special Requests   Final    AEROBIC BOTTLE ONLY Blood Culture results may not be optimal due to an inadequate volume of blood received in culture bottles   Culture   Final    NO GROWTH 4 DAYS Performed at [facility], [address] [place], [place] [address]    Report Status PENDING  Incomplete    Labs: CBC: Recent Labs  Lab 08/05/24 1637 08/06/24 0629 08/08/24 0614 08/09/24 0405  WBC 27.0* 25.2* 14.7* 10.9*  HGB 12.1 11.1* 11.1* 11.4*  HCT 36.9 33.4* 33.4* 33.9*  MCV 86.0 85.6 84.3 83.1  PLT 262 217 271 241   Basic Metabolic Panel: Recent Labs  Lab 08/05/24 1636 08/05/24 1637 08/06/24 0629 08/08/24 0614 08/09/24 0405  NA  --  134* 133* 133* 136  K  --  3.4* 4.0 3.5 3.5  CL  --  99 104 100 104  CO2  --  23 16* 24 24  GLUCOSE  --  108* 123* 122* 127*  BUN  --  11 8 <5* <5*  CREATININE  --  0.72 0.61 0.60 0.67  CALCIUM   --  9.2 8.3* 8.6* 8.4*  MG 2.0  --   --   --   --   PHOS 2.2*  --   --   --   --    Liver Function Tests: Recent Labs  Lab 08/05/24 1637 08/08/24 0614 08/09/24 0405  AST 30 16 19   ALT 24 15 13   ALKPHOS 297* 111 82  BILITOT 0.7 0.5 0.4  PROT 6.6 5.5* 5.4*  ALBUMIN 3.6 3.0* 2.8*  CBG: Recent Labs  Lab 08/09/24 1134  GLUCAP 112*     Discharge time spent: greater than 30 minutes.  Signed: Cresencio Fairly, MD [facility] 08/09/2024

## 2024-08-09 NOTE — TOC Transition Note (Signed)
 Transition of Care [facility]) - Discharge Note   Patient Details  Name: Debra Patel MRN: [id number] Date of Birth: [birth year]  [facility]) CM/SW Contact:  [name], LCSW Phone Number: 08/09/2024, 4:24 PM   Clinical Narrative:   LCSWA confirmed with MD that patient is stable for discharge. LCSWA notified the patient and they are in agreement with discharge. LCSWA confirmed bed is available at ALF [facility]. Daughter Will transport to the facility.   TOC signing off    Final next level of care: Home w Home Health Services Barriers to Discharge: No Barriers Identified   Patient Goals and CMS Choice            Discharge Placement              Patient chooses bed at:  [facility]) Patient to be transferred to facility by: Daughter Name of family member notified: [name] Patient and family notified of of transfer: 08/09/24  Discharge Plan and Services Additional resources added to the After Visit Summary for                                       Social Drivers of Health (SDOH) Interventions SDOH Screenings   Food Insecurity: Patient Unable To Answer (08/05/2024)  Housing: Unknown (08/05/2024)  Transportation Needs: Patient Unable To Answer (08/05/2024)  Utilities: Patient Unable To Answer (08/05/2024)  Financial Resource Strain: Low Risk  (07/21/2024)   Received from [facility]  Social Connections: Patient Unable To Answer (08/05/2024)  Tobacco Use: Low Risk  (08/05/2024)     Readmission Risk Interventions     No data to display

## 2024-08-10 DIAGNOSIS — R293 Abnormal posture: Secondary | ICD-10-CM | POA: Diagnosis not present

## 2024-08-10 DIAGNOSIS — G3183 Dementia with Lewy bodies: Secondary | ICD-10-CM | POA: Diagnosis not present

## 2024-08-10 DIAGNOSIS — M47812 Spondylosis without myelopathy or radiculopathy, cervical region: Secondary | ICD-10-CM | POA: Diagnosis not present

## 2024-08-10 DIAGNOSIS — M19012 Primary osteoarthritis, left shoulder: Secondary | ICD-10-CM | POA: Diagnosis not present

## 2024-08-10 DIAGNOSIS — G4700 Insomnia, unspecified: Secondary | ICD-10-CM | POA: Diagnosis not present

## 2024-08-10 LAB — CULTURE, BLOOD (ROUTINE X 2)
Culture: NO GROWTH
Culture: NO GROWTH

## 2024-08-16 DIAGNOSIS — S7292XD Unspecified fracture of left femur, subsequent encounter for closed fracture with routine healing: Secondary | ICD-10-CM | POA: Diagnosis not present

## 2024-08-16 DIAGNOSIS — F0280 Dementia in other diseases classified elsewhere without behavioral disturbance: Secondary | ICD-10-CM | POA: Diagnosis not present

## 2024-08-16 DIAGNOSIS — Z9181 History of falling: Secondary | ICD-10-CM | POA: Diagnosis not present

## 2024-08-16 DIAGNOSIS — E039 Hypothyroidism, unspecified: Secondary | ICD-10-CM | POA: Diagnosis not present

## 2024-08-16 DIAGNOSIS — G3183 Dementia with Lewy bodies: Secondary | ICD-10-CM | POA: Diagnosis not present

## 2024-08-24 DIAGNOSIS — G3183 Dementia with Lewy bodies: Secondary | ICD-10-CM | POA: Diagnosis not present

## 2024-08-24 DIAGNOSIS — F5105 Insomnia due to other mental disorder: Secondary | ICD-10-CM | POA: Diagnosis not present

## 2024-08-25 ENCOUNTER — Inpatient Hospital Stay: Attending: Oncology | Admitting: Oncology

## 2024-08-25 ENCOUNTER — Encounter: Payer: Self-pay | Admitting: Oncology

## 2024-08-25 ENCOUNTER — Other Ambulatory Visit: Payer: Self-pay | Admitting: Oncology

## 2024-08-25 ENCOUNTER — Encounter: Payer: Self-pay | Admitting: *Deleted

## 2024-08-25 ENCOUNTER — Inpatient Hospital Stay

## 2024-08-25 VITALS — BP 123/59 | HR 72 | Temp 96.1°F | Resp 18 | Wt 153.9 lb

## 2024-08-25 DIAGNOSIS — Z87442 Personal history of urinary calculi: Secondary | ICD-10-CM | POA: Insufficient documentation

## 2024-08-25 DIAGNOSIS — G3183 Dementia with Lewy bodies: Secondary | ICD-10-CM | POA: Diagnosis not present

## 2024-08-25 DIAGNOSIS — Z853 Personal history of malignant neoplasm of breast: Secondary | ICD-10-CM | POA: Diagnosis not present

## 2024-08-25 DIAGNOSIS — E039 Hypothyroidism, unspecified: Secondary | ICD-10-CM | POA: Insufficient documentation

## 2024-08-25 DIAGNOSIS — K449 Diaphragmatic hernia without obstruction or gangrene: Secondary | ICD-10-CM | POA: Insufficient documentation

## 2024-08-25 DIAGNOSIS — K4090 Unilateral inguinal hernia, without obstruction or gangrene, not specified as recurrent: Secondary | ICD-10-CM | POA: Diagnosis not present

## 2024-08-25 DIAGNOSIS — I700 Atherosclerosis of aorta: Secondary | ICD-10-CM | POA: Insufficient documentation

## 2024-08-25 DIAGNOSIS — N6310 Unspecified lump in the right breast, unspecified quadrant: Secondary | ICD-10-CM | POA: Diagnosis present

## 2024-08-25 DIAGNOSIS — M1990 Unspecified osteoarthritis, unspecified site: Secondary | ICD-10-CM | POA: Insufficient documentation

## 2024-08-25 DIAGNOSIS — Z923 Personal history of irradiation: Secondary | ICD-10-CM | POA: Insufficient documentation

## 2024-08-25 DIAGNOSIS — Z803 Family history of malignant neoplasm of breast: Secondary | ICD-10-CM | POA: Diagnosis not present

## 2024-08-25 DIAGNOSIS — Z9221 Personal history of antineoplastic chemotherapy: Secondary | ICD-10-CM | POA: Insufficient documentation

## 2024-08-25 DIAGNOSIS — M7989 Other specified soft tissue disorders: Secondary | ICD-10-CM | POA: Diagnosis not present

## 2024-08-25 DIAGNOSIS — N200 Calculus of kidney: Secondary | ICD-10-CM | POA: Diagnosis not present

## 2024-08-25 DIAGNOSIS — F0280 Dementia in other diseases classified elsewhere without behavioral disturbance: Secondary | ICD-10-CM | POA: Diagnosis not present

## 2024-08-25 DIAGNOSIS — I10 Essential (primary) hypertension: Secondary | ICD-10-CM | POA: Insufficient documentation

## 2024-08-25 DIAGNOSIS — Z79899 Other long term (current) drug therapy: Secondary | ICD-10-CM | POA: Diagnosis not present

## 2024-08-25 DIAGNOSIS — N281 Cyst of kidney, acquired: Secondary | ICD-10-CM | POA: Insufficient documentation

## 2024-08-25 DIAGNOSIS — R928 Other abnormal and inconclusive findings on diagnostic imaging of breast: Secondary | ICD-10-CM

## 2024-08-25 NOTE — Progress Notes (Signed)
 Met with patient and son during initial visit with Dr. Babara.   Plan is to get mammogram and biopsy.  Message sent to [facility] schedulers asking if appointment for mammogram could be on 12/12 when Debra Patel has another appointment scheduled as son lives in [place].

## 2024-08-28 NOTE — Progress Notes (Unsigned)
 Hematology/Oncology Consult note [phone] [phone]        REFERRING PROVIDER: Maree Hue, MD   CHIEF COMPLAINTS/REASON FOR VISIT:  Evaluation of    ASSESSMENT & PLAN:   No problem-specific Assessment & Plan notes found for this encounter.   Orders Placed This Encounter  Procedures   MM 3D DIAGNOSTIC MAMMOGRAM UNILATERAL RIGHT BREAST    Standing Status:   Future    Expected Date:   08/29/2024    Expiration Date:   08/25/2025    Reason for Exam (SYMPTOM  OR DIAGNOSIS REQUIRED):   right breast mass    Preferred imaging location?:   [facility]    All questions were answered. The patient knows to call the clinic with any problems, questions or concerns.  Zelphia Cap, MD, PhD [facility] Hematology Oncology 08/25/2024   HISTORY OF PRESENTING ILLNESS:   Debra Patel is a  83 y.o.  female with PMH listed below was seen in consultation at the request of  Maree Hue, MD  for evaluation of    MEDICAL HISTORY:  Past Medical History:  Diagnosis Date   Arthritis    BP (high blood pressure) 08/21/2014   no current issues   Brain infection 2008   Breast cancer (HCC) 2001   Left side mastectomy   History of kidney stones    h/o   HTN (hypertension)    Hypothyroidism    Lewy body dementia (HCC)    Personal history of chemotherapy 2001   BREAST CA   Personal history of radiation therapy 2001   BREAST CA   Radiation 2001   BREAST CA   S/P chemotherapy, time since greater than 12 weeks 2001   BREAST CA   Tachycardia    Thyroid  disease    Tick fever    Wears contact lenses     SURGICAL HISTORY: Past Surgical History:  Procedure Laterality Date   ABDOMINAL HYSTERECTOMY     BREAST EXCISIONAL BIOPSY Right 2002   EXCISIONAL - NEG   CATARACT EXTRACTION W/PHACO Right 06/19/2019   Procedure: CATARACT EXTRACTION PHACO AND INTRAOCULAR LENS PLACEMENT (IOC) RIGHT ISTENT  00:56.3  9.0%  5.13;  Surgeon: Myrna Adine Anes, MD;  Location: [facility];  Service: Ophthalmology;  Laterality: Right;  Latex   CATARACT EXTRACTION W/PHACO Left 07/31/2019   Procedure: CATARACT EXTRACTION PHACO AND INTRAOCULAR LENS PLACEMENT (IOC) LEFT ISTENT INJ 00:48.1  12.9%  6.34;  Surgeon: Myrna Adine Anes, MD;  Location: [facility];  Service: Ophthalmology;  Laterality: Left;   INTRAMEDULLARY (IM) NAIL INTERTROCHANTERIC Left 06/29/2018   Procedure: INTRAMEDULLARY (IM) NAIL INTERTROCHANTRIC- TFNA;  Surgeon: Leora Lynwood SAUNDERS, MD;  Location: [facility] ORS;  Service: Orthopedics;  Laterality: Left;   KNEE ARTHROPLASTY Left 04/03/2019   Procedure: COMPUTER ASSISTED TOTAL KNEE ARTHROPLASTY;  Surgeon: Mardee Lynwood SQUIBB, MD;  Location: [facility] ORS;  Service: Orthopedics;  Laterality: Left;   Mass Removed from Bladder and Both Ovaries     MASTECTOMY Left 2001   BREAST CA   ORIF FEMUR FRACTURE Left 06/04/2024   Procedure: OPEN REDUCTION INTERNAL FIXATION (ORIF) DISTAL FEMUR FRACTURE;  Surgeon: Edie Norleen PARAS, MD;  Location: [facility] ORS;  Service: Orthopedics;  Laterality: Left;   THYROIDECTOMY Left     SOCIAL HISTORY: Social History   Socioeconomic History   Marital status: Widowed    Spouse name: Not on file   Number of children: Not on file   Years of education: Not on file   Highest  education level: Not on file  Occupational History   Not on file  Tobacco Use   Smoking status: Never   Smokeless tobacco: Never  Vaping Use   Vaping status: Never Used  Substance and Sexual Activity   Alcohol  use: No   Drug use: No   Sexual activity: Not on file  Other Topics Concern   Not on file  Social History Narrative   Not on file   Social Drivers of Health   Financial Resource Strain: Low Risk  (07/21/2024)   Received from [facility]   Overall Financial Resource Strain (CARDIA)    Difficulty of Paying Living Expenses: Not hard at all  Food Insecurity: No Food Insecurity (08/24/2024)   Hunger Vital Sign    Worried About Running Out  of Food in the Last Year: Never true    Ran Out of Food in the Last Year: Never true  Transportation Needs: No Transportation Needs (08/24/2024)   PRAPARE - Administrator, Civil Service (Medical): No    Lack of Transportation (Non-Medical): No  Physical Activity: Not on file  Stress: Not on file  Social Connections: Patient Unable To Answer (08/05/2024)   Social Connection and Isolation Panel    Frequency of Communication with Friends and Family: Patient unable to answer    Frequency of Social Gatherings with Friends and Family: Patient unable to answer    Attends Religious Services: Patient unable to answer    Active Member of Clubs or Organizations: Patient unable to answer    Attends Banker Meetings: Patient unable to answer    Marital Status: Patient unable to answer  Intimate Partner Violence: Patient Unable To Answer (08/05/2024)   Humiliation, Afraid, Rape, and Kick questionnaire    Fear of Current or Ex-Partner: Patient unable to answer    Emotionally Abused: Patient unable to answer    Physically Abused: Patient unable to answer    Sexually Abused: Patient unable to answer    FAMILY HISTORY: Family History  Problem Relation Age of Onset   Breast cancer Mother 50    ALLERGIES:  is allergic to alpha-gal, pyridoxine, hydrocodone , bee venom, motrin  [ibuprofen ], oxycodone , latex, nickel, and nitrofurantoin.  MEDICATIONS:  Current Outpatient Medications  Medication Sig Dispense Refill   acetaminophen  (TYLENOL ) 325 MG tablet Take 650 mg by mouth every 4 (four) hours as needed for mild pain (pain score 1-3).     azelastine  (ASTELIN ) 0.1 % nasal spray Place 1 spray into both nostrils daily as needed for rhinitis. Use in each nostril as directed     Fe Fum-Vit C-Vit B12-FA (TRIGELS-F FORTE) CAPS capsule Take 1 capsule by mouth daily after breakfast.     FLUoxetine  (PROZAC ) 10 MG capsule Take 10 mg by mouth daily.     fluticasone (FLONASE) 50 MCG/ACT  nasal spray Place 2 sprays into both nostrils daily.     loratadine  (CLARITIN ) 10 MG tablet Take 10 mg by mouth daily.     memantine  (NAMENDA ) 5 MG tablet Take 5 mg by mouth 2 (two) times daily.     QUEtiapine  (SEROQUEL ) 50 MG tablet Take 1 tablet (50 mg total) by mouth at bedtime.     saccharomyces boulardii (FLORASTOR) 250 MG capsule Take 1 capsule (250 mg total) by mouth 2 (two) times daily for 7 days. 50 capsule 0   SYNTHROID  50 MCG tablet Take 50 mcg by mouth daily before breakfast.     No current facility-administered medications for this visit.  Review of Systems - Oncology PHYSICAL EXAMINATION:  Vitals:   08/25/24 1230  BP: (!) 123/59  Pulse: 72  Resp: 18  Temp: (!) 96.1 F (35.6 C)  SpO2: 100%   Filed Weights   08/25/24 1230  Weight: 153 lb 14.4 oz (69.8 kg)    Physical Exam  LABORATORY DATA:  I have reviewed the data as listed    Latest Ref Rng & Units 08/09/2024    4:05 AM 08/08/2024    6:14 AM 08/06/2024    6:29 AM  CBC  WBC 4.0 - 10.5 K/uL 10.9  14.7  25.2   Hemoglobin 12.0 - 15.0 g/dL 88.5  88.8  88.8   Hematocrit 36.0 - 46.0 % 33.9  33.4  33.4   Platelets 150 - 400 K/uL 241  271  217       Latest Ref Rng & Units 08/09/2024    4:05 AM 08/08/2024    6:14 AM 08/06/2024    6:29 AM  CMP  Glucose 70 - 99 mg/dL 872  877  876   BUN 8 - 23 mg/dL <5  <5  8   Creatinine 0.44 - 1.00 mg/dL 9.32  9.39  9.38   Sodium 135 - 145 mmol/L 136  133  133   Potassium 3.5 - 5.1 mmol/L 3.5  3.5  4.0   Chloride 98 - 111 mmol/L 104  100  104   CO2 22 - 32 mmol/L 24  24  16    Calcium  8.9 - 10.3 mg/dL 8.4  8.6  8.3   Total Protein 6.5 - 8.1 g/dL 5.4  5.5    Total Bilirubin 0.0 - 1.2 mg/dL 0.4  0.5    Alkaline Phos 38 - 126 U/L 82  111    AST 15 - 41 U/L 19  16    ALT 0 - 44 U/L 13  15        RADIOGRAPHIC STUDIES: I have personally reviewed the radiological images as listed and agreed with the findings in the report. CT ABDOMEN PELVIS W CONTRAST Result Date:  08/05/2024 CLINICAL DATA:  Abdominal pain, diarrhea, concern for C difficile colitis, history of left breast cancer status post mastectomy EXAM: CT ABDOMEN AND PELVIS WITH CONTRAST TECHNIQUE: Multidetector CT imaging of the abdomen and pelvis was performed using the standard protocol following bolus administration of intravenous contrast. RADIATION DOSE REDUCTION: This exam was performed according to the departmental dose-optimization program which includes automated exposure control, adjustment of the mA and/or kV according to patient size and/or use of iterative reconstruction technique. CONTRAST:  OMNIPAQUE  IOHEXOL  300 MG/ML  SOLN COMPARISON:  08/25/2013, 07/01/2018 FINDINGS: Lower chest: Prior left mastectomy. Possible right breast mass measuring 1.8 x 1.1 cm image 1/series 2, incompletely assessed due to slice selection. No acute pleural or parenchymal lung disease. Hepatobiliary: No focal liver abnormality is seen. No gallstones, gallbladder wall thickening, or biliary dilatation. Pancreas: Unremarkable. No pancreatic ductal dilatation or surrounding inflammatory changes. Spleen: Normal in size without focal abnormality. Adrenals/Urinary Tract: The adrenals are unremarkable. There are 2 nonobstructing left renal calculi, measuring 10 mm in the left renal pelvis and 8 mm in the lower pole left kidney. Stable peripelvic cysts within the left kidney. No left-sided hydronephrosis or hydroureter. The right kidney is unremarkable. Bladder is grossly normal. Stomach/Bowel: No bowel obstruction or ileus. There is marked wall thickening throughout the entirety of the colon, compatible with inflammatory or infectious colitis. Small hiatal hernia. Vascular/Lymphatic: Aortic atherosclerosis. No enlarged abdominal or pelvic  lymph nodes. Reproductive: Status post hysterectomy. No adnexal masses. Other: No free fluid or free intraperitoneal gas. Small fat containing right inguinal hernia. Musculoskeletal: Prior ORIF  left femur. There are no acute or destructive bony abnormalities. Reconstructed images demonstrate no additional findings. IMPRESSION: 1. Diffuse colonic wall thickening, compatible with infectious colitis given clinical history. 2. Possible 1.8 cm right breast mass, incompletely imaged on this study. If the patient would be a therapy candidate should neoplasm be detected, outpatient right breast imaging including mammogram and ultrasound could be considered. 3. Nonobstructing left renal calculi. 4.  Aortic Atherosclerosis (ICD10-I70.0). Electronically Signed   By: Ozell Daring M.D.   On: 08/05/2024 18:29   DG FEMUR MIN 2 VIEWS LEFT Result Date: 06/04/2024 CLINICAL DATA:  ORIF left femur EXAM: LEFT FEMUR 2 VIEWS COMPARISON:  Left hip x-ray 06/04/2024. FINDINGS: Eight intraoperative fluoroscopic views of the left femur. There is a new lateral sideplate and multiple screws fixating a mid femoral fracture. There is long stem intramedullary nail with knee arthroplasty. Alignment is near anatomic. Fluoroscopy time 1 minutes 42 seconds. Fluoroscopy dose 14.4 micro gray. IMPRESSION: Intraoperative fluoroscopic views of the left femur. Electronically Signed   By: Greig Pique M.D.   On: 06/04/2024 18:40   DG C-Arm 1-60 Min-No Report Result Date: 06/04/2024 Fluoroscopy was utilized by the requesting physician.  No radiographic interpretation.   CT FEMUR LEFT WO CONTRAST Result Date: 06/04/2024 EXAM: CT OF THE LEFT FEMUR, WITHOUT IV CONTRAST 06/04/2024 07:55:43 AM TECHNIQUE: Axial images were acquired through the left femur without IV contrast. Reformatted images were reviewed. Automated exposure control, iterative reconstruction, and/or weight based adjustment of the mA/kV was utilized to reduce the radiation dose to as low as reasonably achievable. COMPARISON: 11/13/2018 previous left total knee arthroplasty. CLINICAL HISTORY: Upper leg trauma. Pt arrived by ems from [facility] living d/t mechanical fall  out of bed. Upon examination pt has shortened and rotated left hip with pain at site. Pt fell on to laminate flooring. Hx dementia. FINDINGS: BONES: Remote healed intertrochanteric fracture of the proximal left femur with im nail and hip screw in place. Status post left knee arthroplasty There is an acute comminuted fracture deformity involving the distal half of the femur with impaction of the fracture fragments by approximately 1.6 cm and mild lateral angulation of the distal fracture fragments. Fracture line extends up to the superior margin of the femoral component of the left knee arthroplasty device where there is anterior displacement of minor fracture fragments, axial image 76/10. JOINTS: No dislocation. The joint spaces are normal. SOFT TISSUES: The soft tissues are unremarkable. Vascular calcifications noted. LIMITATIONS/ARTIFACTS: Streak artifact from knee prosthesis diminishes exam detail. IMPRESSION: 1. Acute comminuted fracture deformity involving the distal half of the femur with impaction of the fracture fragments by approximately 1.6 cm and mild lateral angulation of the distal fracture fragments. Fracture line extends up to the superior margin of the femoral component of the left knee arthroplasty device where there is anterior displacement of minor fracture fragments. 2. Remote healed intertrochanteric fracture of the proximal left femur with IM nail and hip screw in place. 3. Status post left knee arthroplasty. Electronically signed by: Waddell Calk MD 06/04/2024 08:05 AM EDT RP Workstation: [id number]   DG Hip Unilat W or Wo Pelvis 2-3 Views Left Result Date: 06/04/2024 EXAM: 2 or more VIEW(S) XRAY OF THE LEFT HIP 06/04/2024 06:43:00 AM COMPARISON: 06/29/2018 CLINICAL HISTORY: Pain and deformity after fall. Pt arrived by EMS from [facility]  living d/t mechanical fall out of bed. Upon examination pt has shortened and rotated left hip with pain at site. Pt fell on to laminate flooring.  Hx dementia. Pt received 1g Tylenol  and 4 mg Zofran  en route. FINDINGS: BONES AND JOINTS: Healed intertrochanteric fracture of the left hip with intramedullary nail in place. Mild degenerative changes of the left hip. Partially visualized acute fracture deformity involving the distal left femur. SOFT TISSUES: The soft tissues are unremarkable. IMPRESSION: 1. Partially visualized, acute fracture deformity involving the distal left femur. 2. Healed intertrochanteric fracture of the left hip with intramedullary nail in place. 3. Mild degenerative changes of the left hip. Electronically signed by: Waddell Calk MD 06/04/2024 06:54 AM EDT RP Workstation: [id number]   DG Knee 2 Views Left Result Date: 06/04/2024 CLINICAL DATA:  83 year old female status post fall from bed.  Pain. EXAM: LEFT KNEE - 1-2 VIEW COMPARISON:  Postoperative left knee radiographs 04/03/2019. FINDINGS: Two views at 0630 hours. Chronic left total knee arthroplasty, left femur intramedullary rod. Long segment spiral fracture, periprosthetic fracture distal left femoral shaft and metadiaphysis. The fracture appears to terminates at the proximal aspect of the distal femoral condyle implant (lateral view). Mildly displaced butterfly fragments. Maintained knee joint alignment. Patella appears stable and intact. No proximal tibia or fibula fracture identified. IMPRESSION: Positive for long segment spiral, comminuted, periprosthetic fracture of the distal left femoral shaft and metadiaphysis. Underlying chronic total knee arthroplasty appears to remain aligned. Electronically Signed   By: VEAR Hurst M.D.   On: 06/04/2024 06:54   DG Chest Port 1 View Result Date: 06/04/2024 EXAM: 1 VIEW XRAY OF THE CHEST 06/04/2024 06:43:00 AM COMPARISON: 06/28/2018 CLINICAL HISTORY: Pre-op. Pt arrived by EMS from [facility] living d/t mechanical fall out of bed. Upon examination pt has shortened and rotated left hip with pain at site. Pt fell on to laminate  flooring. Hx dementia. Pt received 1g tylenol  and 4 mg zofran  en route. FINDINGS: LUNGS AND PLEURA: No focal pulmonary opacity. No pulmonary edema. No pleural effusion. No pneumothorax. HEART AND MEDIASTINUM: No acute abnormality of the cardiac and mediastinal silhouettes. Atherosclerotic plaque. BONES AND SOFT TISSUES: No acute osseous abnormality. IMPRESSION: 1. No acute process. Electronically signed by: Waddell Calk MD 06/04/2024 06:51 AM EDT RP Workstation: [id number]

## 2024-08-29 ENCOUNTER — Inpatient Hospital Stay
Admission: RE | Admit: 2024-08-29 | Discharge: 2024-08-29 | Disposition: A | Payer: Self-pay | Source: Ambulatory Visit | Attending: Gerontology

## 2024-08-29 ENCOUNTER — Other Ambulatory Visit: Payer: Self-pay | Admitting: *Deleted

## 2024-08-29 DIAGNOSIS — Z1231 Encounter for screening mammogram for malignant neoplasm of breast: Secondary | ICD-10-CM

## 2024-08-30 NOTE — Assessment & Plan Note (Signed)
 CT findings were discussed with patient.  Patient and son are interested in proceeding with work up including additional imaging and biopsy.  Obtain right diagnostic mammogram/US .

## 2024-08-30 NOTE — Assessment & Plan Note (Signed)
 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  She got 4 cycles of AC followed by Radiation. Did not tolerate Tamoxifen Arimidex and letrozole. S/p bilateral salpingooophorectomy in 2015.

## 2024-08-30 NOTE — Assessment & Plan Note (Signed)
 Recommend follow up with PCP and neurology

## 2024-08-31 ENCOUNTER — Other Ambulatory Visit: Payer: Self-pay | Admitting: Oncology

## 2024-08-31 DIAGNOSIS — N6310 Unspecified lump in the right breast, unspecified quadrant: Secondary | ICD-10-CM

## 2024-08-31 DIAGNOSIS — N63 Unspecified lump in unspecified breast: Secondary | ICD-10-CM

## 2024-09-06 ENCOUNTER — Inpatient Hospital Stay

## 2024-09-06 DIAGNOSIS — N6310 Unspecified lump in the right breast, unspecified quadrant: Secondary | ICD-10-CM

## 2024-09-06 NOTE — Progress Notes (Signed)
 Multidisciplinary Oncology Council Documentation  JADESOLA POYNTER was presented by our [facility] on 09/06/2024, which included representatives from:  Palliative Care Dietitian  Physical/Occupational Therapist Nurse Navigator Genetics Social work Survivorship RN Financial Navigator Research RN   Haddy currently presents with history of breast mass  We reviewed previous medical and familial history, history of present illness, and recent lab results along with all available histopathologic and imaging studies. The MOC considered available treatment options and made the following recommendations/referrals:  SW  The MOC is a meeting of clinicians from various specialty areas who evaluate and discuss patients for whom a multidisciplinary approach is being considered. Final determinations in the plan of care are those of the provider(s).   Today's extended care, comprehensive team conference, Rickayla was not present for the discussion and was not examined.

## 2024-09-07 ENCOUNTER — Other Ambulatory Visit

## 2024-09-07 ENCOUNTER — Encounter

## 2024-09-07 NOTE — Progress Notes (Signed)
 [facility] Clinical Social Work  Clinical Social Work was referred by STANDARD [facility]IFIC NP for assessment of psychosocial needs.  Clinical Social Work Tax Inspector to contact patient and reassess in January based on treatment/ plan of care.    Follow Up Plan:  CSW will follow up with patient in January post- mammogram/ biopsy.     Thersia KATHEE Daring  Clinical Social Work Intern [employer]
